# Patient Record
Sex: Male | Born: 1960
Health system: Southern US, Community
[De-identification: ages and names within clinical notes are randomized; demographics above are authoritative.]

## PROBLEM LIST (undated history)

## (undated) DIAGNOSIS — M1990 Unspecified osteoarthritis, unspecified site: Secondary | ICD-10-CM

## (undated) DIAGNOSIS — Q909 Down syndrome, unspecified: Secondary | ICD-10-CM

## (undated) DIAGNOSIS — M51369 Other intervertebral disc degeneration, lumbar region without mention of lumbar back pain or lower extremity pain: Secondary | ICD-10-CM

## (undated) DIAGNOSIS — M5136 Other intervertebral disc degeneration, lumbar region: Secondary | ICD-10-CM

## (undated) HISTORY — DX: Unspecified osteoarthritis, unspecified site: M19.90

## (undated) HISTORY — PX: KNEE SURGERY: SHX244

## (undated) HISTORY — PX: LEG SURGERY: SHX1003

---

## 2006-04-05 ENCOUNTER — Emergency Department: Payer: Self-pay | Admitting: Emergency Medicine

## 2006-04-05 ENCOUNTER — Other Ambulatory Visit: Payer: Self-pay

## 2006-09-25 ENCOUNTER — Emergency Department: Payer: Self-pay | Admitting: General Practice

## 2007-11-19 ENCOUNTER — Emergency Department: Payer: Self-pay | Admitting: Emergency Medicine

## 2007-11-19 ENCOUNTER — Other Ambulatory Visit: Payer: Self-pay

## 2008-06-11 ENCOUNTER — Other Ambulatory Visit: Payer: Self-pay

## 2008-06-11 ENCOUNTER — Emergency Department: Payer: Self-pay | Admitting: Emergency Medicine

## 2012-02-04 ENCOUNTER — Emergency Department: Payer: Self-pay | Admitting: Emergency Medicine

## 2012-02-04 LAB — URINALYSIS, COMPLETE
Bacteria: NONE SEEN
Blood: NEGATIVE
Glucose,UR: NEGATIVE mg/dL (ref 0–75)
Leukocyte Esterase: NEGATIVE
Nitrite: NEGATIVE
Protein: NEGATIVE
RBC,UR: 1 /HPF (ref 0–5)
Specific Gravity: 1.026 (ref 1.003–1.030)
WBC UR: 4 /HPF (ref 0–5)

## 2012-02-04 LAB — COMPREHENSIVE METABOLIC PANEL
Alkaline Phosphatase: 60 U/L (ref 50–136)
BUN: 12 mg/dL (ref 7–18)
Calcium, Total: 8.6 mg/dL (ref 8.5–10.1)
Co2: 31 mmol/L (ref 21–32)
Creatinine: 1.27 mg/dL (ref 0.60–1.30)
EGFR (Non-African Amer.): >60
Osmolality: 288 (ref 275–301)
SGOT(AST): 42 U/L — ABNORMAL HIGH (ref 15–37)
SGPT (ALT): 21 U/L

## 2012-02-04 LAB — CBC
HCT: 39.2 % — ABNORMAL LOW (ref 40.0–52.0)
HGB: 12.8 g/dL — ABNORMAL LOW (ref 13.0–18.0)
MCH: 31.8 pg (ref 26.0–34.0)
MCV: 97 fL (ref 80–100)
Platelet: 268 10*3/uL (ref 150–440)
RBC: 4.04 10*6/uL — ABNORMAL LOW (ref 4.40–5.90)
RDW: 14.2 % (ref 11.5–14.5)
WBC: 4.8 10*3/uL (ref 3.8–10.6)

## 2014-05-25 ENCOUNTER — Emergency Department: Payer: Self-pay | Admitting: Internal Medicine

## 2014-05-25 LAB — URINALYSIS, COMPLETE
BILIRUBIN, UR: NEGATIVE
Bacteria: NONE SEEN
GLUCOSE, UR: NEGATIVE mg/dL (ref 0–75)
KETONE: NEGATIVE
Leukocyte Esterase: NEGATIVE
Nitrite: NEGATIVE
PROTEIN: NEGATIVE
Ph: 5 (ref 4.5–8.0)
SPECIFIC GRAVITY: 1.023 (ref 1.003–1.030)
Squamous Epithelial: 1
WBC UR: 1 /HPF (ref 0–5)

## 2014-06-07 ENCOUNTER — Emergency Department: Payer: Self-pay | Admitting: Emergency Medicine

## 2014-08-26 ENCOUNTER — Emergency Department: Payer: Self-pay | Admitting: Student

## 2014-08-26 LAB — BASIC METABOLIC PANEL
ANION GAP: 9 (ref 7–16)
BUN: 14 mg/dL (ref 7–18)
CALCIUM: 8 mg/dL — AB (ref 8.5–10.1)
CHLORIDE: 104 mmol/L (ref 98–107)
Co2: 29 mmol/L (ref 21–32)
Creatinine: 1.22 mg/dL (ref 0.60–1.30)
GLUCOSE: 89 mg/dL (ref 65–99)
OSMOLALITY: 283 (ref 275–301)
POTASSIUM: 3.4 mmol/L — AB (ref 3.5–5.1)
SODIUM: 142 mmol/L (ref 136–145)

## 2014-08-26 LAB — URINALYSIS, COMPLETE
BLOOD: NEGATIVE
Bacteria: NONE SEEN
Bilirubin,UR: NEGATIVE
Glucose,UR: NEGATIVE mg/dL (ref 0–75)
Ketone: NEGATIVE
LEUKOCYTE ESTERASE: NEGATIVE
Nitrite: NEGATIVE
PROTEIN: NEGATIVE
Ph: 6 (ref 4.5–8.0)
Specific Gravity: 1.017 (ref 1.003–1.030)
WBC UR: 1 /HPF (ref 0–5)

## 2014-08-26 LAB — CBC
HCT: 35 % — ABNORMAL LOW (ref 40.0–52.0)
HGB: 11.1 g/dL — AB (ref 13.0–18.0)
MCH: 30.3 pg (ref 26.0–34.0)
MCHC: 31.8 g/dL — AB (ref 32.0–36.0)
MCV: 96 fL (ref 80–100)
PLATELETS: 198 10*3/uL (ref 150–440)
RBC: 3.67 10*6/uL — AB (ref 4.40–5.90)
RDW: 14.1 % (ref 11.5–14.5)
WBC: 5.1 10*3/uL (ref 3.8–10.6)

## 2014-08-26 LAB — TROPONIN I: Troponin-I: <0.02 ng/mL

## 2015-01-04 DIAGNOSIS — R410 Disorientation, unspecified: Secondary | ICD-10-CM | POA: Diagnosis not present

## 2015-01-04 DIAGNOSIS — M545 Low back pain: Secondary | ICD-10-CM | POA: Diagnosis not present

## 2015-01-19 DIAGNOSIS — Z0000 Encounter for general adult medical examination without abnormal findings: Secondary | ICD-10-CM | POA: Diagnosis not present

## 2015-01-19 DIAGNOSIS — E663 Overweight: Secondary | ICD-10-CM | POA: Diagnosis not present

## 2015-10-31 ENCOUNTER — Emergency Department
Admission: EM | Admit: 2015-10-31 | Discharge: 2015-11-01 | Disposition: A | Discharge status: Home / Self Care | Payer: Commercial Managed Care - HMO | Attending: Emergency Medicine | Admitting: Emergency Medicine

## 2015-10-31 DIAGNOSIS — K5900 Constipation, unspecified: Secondary | ICD-10-CM | POA: Diagnosis not present

## 2015-10-31 DIAGNOSIS — M545 Low back pain: Secondary | ICD-10-CM | POA: Diagnosis present

## 2015-10-31 DIAGNOSIS — M549 Dorsalgia, unspecified: Secondary | ICD-10-CM | POA: Diagnosis not present

## 2015-10-31 LAB — URINALYSIS COMPLETE WITH MICROSCOPIC (ARMC ONLY)
BILIRUBIN URINE: NEGATIVE
Bacteria, UA: NONE SEEN
Glucose, UA: NEGATIVE mg/dL
HGB URINE DIPSTICK: NEGATIVE
KETONES UR: NEGATIVE mg/dL
LEUKOCYTES UA: NEGATIVE
Nitrite: NEGATIVE
PH: 5 (ref 5.0–8.0)
Protein, ur: NEGATIVE mg/dL
Specific Gravity, Urine: 1.023 (ref 1.005–1.030)
Squamous Epithelial / LPF: NONE SEEN

## 2015-10-31 LAB — CBC
HCT: 37.5 % — ABNORMAL LOW (ref 40.0–52.0)
Hemoglobin: 12 g/dL — ABNORMAL LOW (ref 13.0–18.0)
MCH: 29.8 pg (ref 26.0–34.0)
MCHC: 32.1 g/dL (ref 32.0–36.0)
MCV: 92.7 fL (ref 80.0–100.0)
PLATELETS: 248 10*3/uL (ref 150–440)
RBC: 4.04 MIL/uL — ABNORMAL LOW (ref 4.40–5.90)
RDW: 15.3 % — AB (ref 11.5–14.5)
WBC: 6.8 10*3/uL (ref 3.8–10.6)

## 2015-10-31 MED ORDER — SODIUM CHLORIDE 0.9 % IV BOLUS (SEPSIS)
1000.0000 mL | Freq: Once | INTRAVENOUS | Status: AC
  Administered 2015-11-01: 1000 mL via INTRAVENOUS

## 2015-10-31 NOTE — ED Notes (Signed)
Pt family member standing outside of door. Explained to her for patient privacy please remain inside of the room. Tried to explain that this RN had already assessed the patient while she was not in the room, family member ignored this RN and was loudly talking on the phone. This RN left the room closing the door and pt family member opened the door stared at this RN and walked back in the room

## 2015-10-31 NOTE — ED Provider Notes (Signed)
[facility] Emergency Department Provider Note  ____________________________________________  Time seen: 1:00 PM  I have reviewed the triage vital signs and the nursing notes.  History obtained from the patient and his sister HISTORY  Chief Complaint Back Pain     HPI Joseph Flores is a 54 y.o. male Presents with complaint of low back pain times one day. Patient's sister states that he had an episode of unsteady gait today. Patient admits to dysuria and subjective fevers. Patient denies any weakness or numbness at this time.patient states pain is worse with movement current pain score 7 out of 10.     Past medical history Down syndrome There are no active problems to display for this patient.   Past surgical history None No current outpatient prescriptions on file.  Allergies none No family history on file.  Social History Social History  Substance Use Topics  . Smoking status: Not on file  . Smokeless tobacco: Not on file  . Alcohol Use: Not on file    Review of Systems  Constitutional: Negative for fever. Eyes: Negative for visual changes. ENT: Negative for sore throat. Cardiovascular: Negative for chest pain. Respiratory: Negative for shortness of breath. Gastrointestinal: Negative for abdominal pain, vomiting and diarrhea. Genitourinary: Negative for dysuria. Musculoskeletal: positive for back pain Skin: Negative for rash. Neurological: Negative for headaches, focal weakness or numbness.   10-point ROS otherwise negative.  ____________________________________________   PHYSICAL EXAM:  VITAL SIGNS: ED Triage Vitals  Enc Vitals Group     BP 10/31/15 2222 114/77 mmHg     Pulse Rate 10/31/15 2222 64     Resp 10/31/15 2222 18     Temp 10/31/15 2222 98.5 F (36.9 C)     Temp Source 10/31/15 2222 Oral     SpO2 10/31/15 2222 99 %     Weight --      Height --      Head Cir --      Peak Flow --      Pain Score  10/31/15 2222 10     Pain Loc --      Pain Edu? --      Excl. in [place]? --     Constitutional: Alert and oriented. Well appearing and in no distress. Eyes: Conjunctivae are normal. PERRL. Normal extraocular movements. ENT   Head: Normocephalic and atraumatic.   Nose: No congestion/rhinnorhea.   Mouth/Throat: Mucous membranes are moist.   Neck: No stridor. Hematological/Lymphatic/Immunilogical: No cervical lymphadenopathy. Cardiovascular: Normal rate, regular rhythm. Normal and symmetric distal pulses are present in all extremities. No murmurs, rubs, or gallops. Respiratory: Normal respiratory effort without tachypnea nor retractions. Breath sounds are clear and equal bilaterally. No wheezes/rales/rhonchi. Gastrointestinal: Bilateral lower quadrant tenderness to palpation.. No distention. Left CVA tenderness Genitourinary: deferred Musculoskeletal: Nontender with normal range of motion in all extremities. No joint effusions.  No lower extremity tenderness nor edema. Neurologic:  Normal speech and language. No gross focal neurologic deficits are appreciated. Speech is normal.  Skin:  Skin is warm, dry and intact. No rash noted. Psychiatric: Mood and affect are normal. Speech and behavior are normal. Patient exhibits appropriate insight and judgment.  ____________________________________________    LABS (pertinent positives/negatives) Labs Reviewed  CBC - Abnormal; Notable for the following:    RBC 4.04 (*)    Hemoglobin 12.0 (*)    HCT 37.5 (*)    RDW 15.3 (*)    All other components within normal limits  COMPREHENSIVE METABOLIC PANEL - Abnormal;  Notable for the following:    Glucose, Bld 116 (*)    Albumin 3.3 (*)    All other components within normal limits  URINALYSIS COMPLETEWITH MICROSCOPIC ([facility] ONLY) - Abnormal; Notable for the following:    Color, Urine YELLOW (*)    APPearance CLEAR (*)    All other components within normal limits       RADIOLOGY       CT Abdomen Pelvis W Contrast (Final result) Result time: 11/01/15 01:21:03   Final result by Rad Results In Interface (11/01/15 01:21:03)   Narrative:   CLINICAL DATA: 53 year old male with generalized abdominal pain as well as back pain.  EXAM: CT ABDOMEN AND PELVIS WITH CONTRAST  TECHNIQUE: Multidetector CT imaging of the abdomen and pelvis was performed using the standard protocol following bolus administration of intravenous contrast.  CONTRAST: 168mL OMNIPAQUE IOHEXOL 350 MG/ML SOLN  COMPARISON: Lumbar spine radiograph dated 02/04/2012  FINDINGS: The visualized lung bases are clear. No intra-abdominal free air or free fluid.  The liver, gallbladder, pancreas, spleen, adrenal glands, kidneys, visualized ureters appear unremarkable. There is apparent diffuse thickening of the bladder wall which may be partly related to underdistention. Cystitis is not excluded. Correlation with urinalysis recommended. The prostate and seminal vesicles are grossly unremarkable.  There is moderate stool throughout the colon. No evidence of bowel obstruction or inflammation. Normal appendix.  The abdominal aorta and IVC appear unremarkable. No portal venous gas identified. There is no lymphadenopathy. There is mild diffuse stranding of the subhepatic fat, nonspecific. No fluid collection identified. There is a focal area of swirling within the mesentery in the left hemiabdomen without evidence of obstruction or inflammation.  There is diffuse thickening and stranding of the subcutaneous soft tissues of the perineum concerning for an infectious process. No soft tissue gas or fluid collection identified. Clinical correlation is recommended. There is diffuse degenerative changes of the spine. No acute fracture.  IMPRESSION: Constipation. No evidence of bowel obstruction or inflammation. Normal appendix.  Diffuse thickening and inflammatory changes of the subcutaneous  soft tissues of the perineum without soft tissue gas or fluid collection. Clinical correlation is recommended.  Diffuse bladder wall thickening. Correlation with urinalysis recommended to exclude cystitis.   Electronically Signed By: Anner Crete M.D. On: 11/01/2015 01:21        ECG Results       INITIAL IMPRESSION / ASSESSMENT AND PLAN / ED COURSE  Pertinent labs & imaging results that were available during my care of the patient were reviewed by me and considered in my medical decision making (see chart for details).  Regarding CT scan findings of diffuse peritoneal signs subcutaneous soft tissue inflammation I evaluated the patient's perineum and noted no evidence of cellulitis or abscess formation. Urinalysis revealed no evidence of a UTI.  ____________________________________________   FINAL CLINICAL IMPRESSION(S) / ED DIAGNOSES  Final diagnoses:  Constipation, unspecified constipation type      Gregor Hams, MD 11/01/15 [phone]

## 2015-10-31 NOTE — ED Notes (Signed)
Pt is developmentally delayed. Pt reports back pain after "taking a pill of my sisters".

## 2015-11-01 ENCOUNTER — Emergency Department: Payer: Commercial Managed Care - HMO

## 2015-11-01 DIAGNOSIS — K5900 Constipation, unspecified: Secondary | ICD-10-CM | POA: Diagnosis not present

## 2015-11-01 LAB — COMPREHENSIVE METABOLIC PANEL
ALT: 19 U/L (ref 17–63)
ANION GAP: 9 (ref 5–15)
AST: 35 U/L (ref 15–41)
Albumin: 3.3 g/dL — ABNORMAL LOW (ref 3.5–5.0)
Alkaline Phosphatase: 62 U/L (ref 38–126)
BILIRUBIN TOTAL: 0.8 mg/dL (ref 0.3–1.2)
BUN: 19 mg/dL (ref 6–20)
CHLORIDE: 102 mmol/L (ref 101–111)
CO2: 30 mmol/L (ref 22–32)
Calcium: 8.9 mg/dL (ref 8.9–10.3)
Creatinine, Ser: 1.18 mg/dL (ref 0.61–1.24)
GFR calc Af Amer: >60 mL/min (ref >60)
Glucose, Bld: 116 mg/dL — ABNORMAL HIGH (ref 65–99)
POTASSIUM: 3.9 mmol/L (ref 3.5–5.1)
Sodium: 141 mmol/L (ref 135–145)
TOTAL PROTEIN: 7.5 g/dL (ref 6.5–8.1)

## 2015-11-01 MED ORDER — IOHEXOL 350 MG/ML SOLN
100.0000 mL | Freq: Once | INTRAVENOUS | Status: AC | PRN
  Administered 2015-11-01: 100 mL via INTRAVENOUS

## 2015-11-01 MED ORDER — IOHEXOL 240 MG/ML SOLN
25.0000 mL | Freq: Once | INTRAMUSCULAR | Status: AC | PRN
  Administered 2015-11-01: 25 mL via ORAL

## 2015-11-01 MED ORDER — MAGNESIUM CITRATE PO SOLN
1.0000 | Freq: Once | ORAL | Status: AC
  Administered 2015-11-01: 1 via ORAL
  Filled 2015-11-01: qty 296

## 2015-11-01 MED ORDER — SENNOSIDES-DOCUSATE SODIUM 8.6-50 MG PO TABS
2.0000 | ORAL_TABLET | Freq: Once | ORAL | Status: AC
  Administered 2015-11-01: 2 via ORAL
  Filled 2015-11-01: qty 2

## 2015-11-01 NOTE — Discharge Instructions (Signed)

## 2015-11-08 ENCOUNTER — Ambulatory Visit
Admission: EM | Admit: 2015-11-08 | Discharge: 2015-11-08 | Disposition: A | Discharge status: Home / Self Care | Payer: Commercial Managed Care - HMO | Attending: Family Medicine | Admitting: Family Medicine

## 2015-11-08 ENCOUNTER — Ambulatory Visit (INDEPENDENT_AMBULATORY_CARE_PROVIDER_SITE_OTHER): Payer: Commercial Managed Care - HMO

## 2015-11-08 DIAGNOSIS — M47816 Spondylosis without myelopathy or radiculopathy, lumbar region: Secondary | ICD-10-CM | POA: Diagnosis not present

## 2015-11-08 DIAGNOSIS — M47896 Other spondylosis, lumbar region: Secondary | ICD-10-CM

## 2015-11-08 DIAGNOSIS — M545 Low back pain: Secondary | ICD-10-CM

## 2015-11-08 HISTORY — DX: Down syndrome, unspecified: Q90.9

## 2015-11-08 LAB — CBC WITH DIFFERENTIAL/PLATELET
BASOS ABS: 0 10*3/uL (ref 0–0.1)
Basophils Relative: 1 %
EOS ABS: 0 10*3/uL (ref 0–0.7)
EOS PCT: 0 %
HCT: 35.9 % — ABNORMAL LOW (ref 40.0–52.0)
HEMOGLOBIN: 11.8 g/dL — AB (ref 13.0–18.0)
LYMPHS ABS: 0.9 10*3/uL — AB (ref 1.0–3.6)
Lymphocytes Relative: 17 %
MCH: 30.6 pg (ref 26.0–34.0)
MCHC: 32.8 g/dL (ref 32.0–36.0)
MCV: 93.1 fL (ref 80.0–100.0)
Monocytes Absolute: 0.7 10*3/uL (ref 0.2–1.0)
Monocytes Relative: 14 %
NEUTROS PCT: 68 %
Neutro Abs: 3.6 10*3/uL (ref 1.4–6.5)
PLATELETS: 387 10*3/uL (ref 150–440)
RBC: 3.85 MIL/uL — AB (ref 4.40–5.90)
RDW: 14.8 % — ABNORMAL HIGH (ref 11.5–14.5)
WBC: 5.3 10*3/uL (ref 3.8–10.6)

## 2015-11-08 LAB — BASIC METABOLIC PANEL
ANION GAP: 9 (ref 5–15)
BUN: 25 mg/dL — AB (ref 6–20)
CHLORIDE: 100 mmol/L — AB (ref 101–111)
CO2: 31 mmol/L (ref 22–32)
Calcium: 9.1 mg/dL (ref 8.9–10.3)
Creatinine, Ser: 1.41 mg/dL — ABNORMAL HIGH (ref 0.61–1.24)
GFR, EST NON AFRICAN AMERICAN: 55 mL/min — AB (ref >60)
Glucose, Bld: 109 mg/dL — ABNORMAL HIGH (ref 65–99)
POTASSIUM: 3.8 mmol/L (ref 3.5–5.1)
SODIUM: 140 mmol/L (ref 135–145)

## 2015-11-08 MED ORDER — KETOROLAC TROMETHAMINE 60 MG/2ML IM SOLN
60.0000 mg | Freq: Once | INTRAMUSCULAR | Status: AC
  Administered 2015-11-08: 60 mg via INTRAMUSCULAR

## 2015-11-08 MED ORDER — CYCLOBENZAPRINE HCL 10 MG PO TABS: 10.0000 mg | ORAL_TABLET | Freq: Every day | ORAL | Status: DC

## 2015-11-08 NOTE — ED Notes (Signed)
Back pain reported "for a while".  Been seen at clinic in March.  Seen at [facility] last tues.   Caregiver reports blood and urine tests were fine.  Wants to see what is going on with his back.  Pt was walking slowly to treatment room.

## 2015-11-08 NOTE — ED Provider Notes (Signed)
CSN: [id number]     Arrival date & time 11/08/15  1424 History   First MD Initiated Contact with Patient 11/08/15 1541     Chief Complaint  Patient presents with  . Back Pain   (Consider location/radiation/quality/duration/timing/severity/associated sxs/prior Treatment) HPI Comments: 54 yo male with a h/o Down syndrome presents with a 1 week h/o low back pain, worse with position changes. Was seen in ED about 6 days ago and diagnosed with constipation; question about findings in the perineum on CT, however exam in ED negative, normal.  Patient here with sister who is caretaker. Patient has had normal bowel movements the last few days. No urinary symptoms. Complains of pain and points to the lower back (lumbar) area.   The history is provided by the patient and a relative.    Past Medical History  Diagnosis Date  . Hypertension   . Down's syndrome    History reviewed. No pertinent past surgical history. History reviewed. No pertinent family history. Social History  Substance Use Topics  . Smoking status: Never Smoker   . Smokeless tobacco: None  . Alcohol Use: No    Review of Systems  Allergies  Review of patient's allergies indicates no known allergies.  Home Medications   Prior to Admission medications   Medication Sig Start Date End Date Taking? Authorizing Provider  cyclobenzaprine (FLEXERIL) 10 MG tablet Take 1 tablet (10 mg total) by mouth at bedtime. 11/08/15   Norval Gable, MD   Meds Ordered and Administered this Visit   Medications  ketorolac (TORADOL) injection 60 mg (60 mg Intramuscular Given 11/08/15 1702)    BP 95/64 mmHg  Pulse 84  Temp(Src) 96.6 F (35.9 C) (Tympanic)  Resp 16  SpO2 100% No data found.   Physical Exam  Constitutional: He appears well-developed and well-nourished. No distress.  Pulmonary/Chest: Effort normal. No respiratory distress.  Genitourinary:  Perineum normal appearing and no tenderness to palpation  Musculoskeletal:      Lumbar back: He exhibits tenderness, bony tenderness and spasm. He exhibits normal range of motion, no swelling, no edema, no deformity, no laceration, no pain and normal pulse.  Mild tenderness to palpation over the lumbar sacral paraspinous muscles and vertebrea  Neurological: He is alert. He has normal reflexes. He displays normal reflexes. He exhibits normal muscle tone. Coordination normal.  Skin: No rash noted. He is not diaphoretic.  Nursing note and vitals reviewed.   ED Course  Procedures (including critical care time)  Labs Review Labs Reviewed  CBC WITH DIFFERENTIAL/PLATELET - Abnormal; Notable for the following:    RBC 3.85 (*)    Hemoglobin 11.8 (*)    HCT 35.9 (*)    RDW 14.8 (*)    Lymphs Abs 0.9 (*)    All other components within normal limits  BASIC METABOLIC PANEL - Abnormal; Notable for the following:    Chloride 100 (*)    Glucose, Bld 109 (*)    BUN 25 (*)    Creatinine, Ser 1.41 (*)    GFR calc non Af Amer 55 (*)    All other components within normal limits    Imaging Review Dg Lumbar Spine Complete  11/08/2015  CLINICAL DATA:  Low back pain for several months, no known injury, initial encounter EXAM: LUMBAR SPINE - COMPLETE 4+ VIEW COMPARISON:  None. FINDINGS: Five lumbar type vertebral bodies are well visualized. Vertebral body height is well maintained. Pelvic ring is intact. No spondylolysis or spondylolisthesis is seen. Mild facet hypertrophic changes are  noted. Mild osteophytic changes are seen. Disc space narrowing is noted at L4-5. IMPRESSION: Multilevel degenerative change without acute abnormality. Electronically Signed   By: Inez Catalina M.D.   On: 11/08/2015 16:32   Dg Sacrum/coccyx  11/08/2015  CLINICAL DATA:  Mid lower back pain off and on since March. EXAM: SACRUM AND COCCYX - 2+ VIEW COMPARISON:  CT 11/01/2015 FINDINGS: There is no evidence of fracture or other focal bone lesions. Mild degenerate spurring and degenerative changes in the  lower lumbar spine. IMPRESSION: No acute findings. Electronically Signed   By: Rolm Baptise M.D.   On: 11/08/2015 16:33     Visual Acuity Review  Right Eye Distance:   Left Eye Distance:   Bilateral Distance:    Right Eye Near:   Left Eye Near:    Bilateral Near:         MDM   1. Low back pain without sciatica, unspecified back pain laterality   2. Other osteoarthritis of spine, lumbar region      Discharge Medication List as of 11/08/2015  5:57 PM    START taking these medications   Details  cyclobenzaprine (FLEXERIL) 10 MG tablet Take 1 tablet (10 mg total) by mouth at bedtime., Starting 11/08/2015, Until Discontinued, Normal       1. Lab results, x-ray results and diagnosis reviewed with sister and patient  2. rx as per orders above; reviewed possible side effects, interactions, risks and benefits  3. Recommend supportive treatment with otc NSAIDS/analgesics, gentle low back stretches; increased fluids 4. Follow-up prn if symptoms worsen or don't improve   Norval Gable, MD 11/08/15 [phone]

## 2015-11-08 NOTE — ED Notes (Addendum)
Per sister pt usually up and active.  Rides bicycle to [place], but hasn't recently.   When asked, pt points to lower back as location of pain.

## 2015-11-08 NOTE — ED Notes (Addendum)
Pt unable to verbally give pain score but does report his pain to be doing better. MD aware.

## 2015-12-06 ENCOUNTER — Encounter: Payer: Self-pay | Admitting: Family Medicine

## 2015-12-06 ENCOUNTER — Ambulatory Visit (INDEPENDENT_AMBULATORY_CARE_PROVIDER_SITE_OTHER): Payer: Commercial Managed Care - HMO | Admitting: Family Medicine

## 2015-12-06 VITALS — BP 118/68 | HR 89 | Temp 98.5°F | Resp 18 | Ht 61.0 in | Wt 141.1 lb

## 2015-12-06 DIAGNOSIS — R7989 Other specified abnormal findings of blood chemistry: Secondary | ICD-10-CM | POA: Insufficient documentation

## 2015-12-06 DIAGNOSIS — Z7189 Other specified counseling: Secondary | ICD-10-CM | POA: Diagnosis not present

## 2015-12-06 DIAGNOSIS — R748 Abnormal levels of other serum enzymes: Secondary | ICD-10-CM | POA: Diagnosis not present

## 2015-12-06 DIAGNOSIS — M5136 Other intervertebral disc degeneration, lumbar region: Secondary | ICD-10-CM | POA: Insufficient documentation

## 2015-12-06 DIAGNOSIS — Z7689 Persons encountering health services in other specified circumstances: Secondary | ICD-10-CM

## 2015-12-06 DIAGNOSIS — Q909 Down syndrome, unspecified: Secondary | ICD-10-CM

## 2015-12-06 DIAGNOSIS — R011 Cardiac murmur, unspecified: Secondary | ICD-10-CM | POA: Insufficient documentation

## 2015-12-06 MED ORDER — MELOXICAM 7.5 MG PO TABS: 7.5000 mg | ORAL_TABLET | Freq: Every day | ORAL | Status: DC
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
## 2015-12-07 LAB — COMPREHENSIVE METABOLIC PANEL WITH GFR
ALT: 13 IU/L (ref 0–44)
AST: 23 IU/L (ref 0–40)
Albumin/Globulin Ratio: 1.3 (ref 1.1–2.5)
Albumin: 3.4 g/dL — ABNORMAL LOW (ref 3.5–5.5)
Alkaline Phosphatase: 60 IU/L (ref 39–117)
BUN/Creatinine Ratio: 9 (ref 9–20)
BUN: 12 mg/dL (ref 6–24)
Bilirubin Total: <0.2 mg/dL (ref 0.0–1.2)
CO2: 25 mmol/L (ref 18–29)
Calcium: 7.9 mg/dL — ABNORMAL LOW (ref 8.7–10.2)
Chloride: 98 mmol/L (ref 96–106)
Creatinine, Ser: 1.32 mg/dL — ABNORMAL HIGH (ref 0.76–1.27)
GFR calc Af Amer: 71 mL/min/1.73
GFR calc non Af Amer: 61 mL/min/1.73
Globulin, Total: 2.6 g/dL (ref 1.5–4.5)
Glucose: 94 mg/dL (ref 65–99)
Potassium: 3.3 mmol/L — ABNORMAL LOW (ref 3.5–5.2)
Sodium: 138 mmol/L (ref 134–144)
Total Protein: 6 g/dL (ref 6.0–8.5)

## 2016-01-01 ENCOUNTER — Ambulatory Visit (INDEPENDENT_AMBULATORY_CARE_PROVIDER_SITE_OTHER): Payer: Commercial Managed Care - HMO | Admitting: Cardiovascular Disease

## 2016-01-01 ENCOUNTER — Other Ambulatory Visit: Payer: Self-pay | Admitting: Family Medicine

## 2016-01-01 ENCOUNTER — Encounter: Payer: Self-pay | Admitting: Cardiovascular Disease

## 2016-01-01 VITALS — BP 84/60 | HR 79 | Ht 60.0 in | Wt 139.5 lb

## 2016-01-01 DIAGNOSIS — R011 Cardiac murmur, unspecified: Secondary | ICD-10-CM | POA: Diagnosis not present

## 2016-01-01 NOTE — Assessment & Plan Note (Addendum)
Based on the quality of the heart murmur, it is most likely physiologic. However, given his known history of Down's syndrome and association with congenital heart disease especially atrial septal defect, I requested an echocardiogram for evaluation.

## 2016-01-01 NOTE — Patient Instructions (Signed)
Medication Instructions:  Your physician recommends that you continue on your current medications as directed. Please refer to the Current Medication list given to you today.   Labwork: none  Testing/Procedures: Your physician has requested that you have an echocardiogram. Echocardiography is a painless test that uses sound waves to create images of your heart. It provides your doctor with information about the size and shape of your heart and how well your heart's chambers and valves are working. This procedure takes approximately one hour. There are no restrictions for this procedure.    Follow-Up: Your physician recommends that you schedule a follow-up appointment as needed with Dr. Fletcher Anon.    Any Other Special Instructions Will Be Listed Below (If Applicable).     If you need a refill on your cardiac medications before your next appointment, please call your pharmacy.  Echocardiogram An echocardiogram, or echocardiography, uses sound waves (ultrasound) to produce an image of your heart. The echocardiogram is simple, painless, obtained within a short period of time, and offers valuable information to your health care provider. The images from an echocardiogram can provide information such as:  Evidence of coronary artery disease (CAD).  Heart size.  Heart muscle function.  Heart valve function.  Aneurysm detection.  Evidence of a past heart attack.  Fluid buildup around the heart.  Heart muscle thickening.  Assess heart valve function. LET [facility] CARE PROVIDER KNOW ABOUT:  Any allergies you have.  All medicines you are taking, including vitamins, herbs, eye drops, creams, and over-the-counter medicines.  Previous problems you or members of your family have had with the use of anesthetics.  Any blood disorders you have.  Previous surgeries you have had.  Medical conditions you have.  Possibility of pregnancy, if this applies. BEFORE THE PROCEDURE  No  special preparation is needed. Eat and drink normally.  PROCEDURE   In order to produce an image of your heart, gel will be applied to your chest and a wand-like tool (transducer) will be moved over your chest. The gel will help transmit the sound waves from the transducer. The sound waves will harmlessly bounce off your heart to allow the heart images to be captured in real-time motion. These images will then be recorded.  You may need an IV to receive a medicine that improves the quality of the pictures. AFTER THE PROCEDURE You may return to your normal schedule including diet, activities, and medicines, unless your health care provider tells you otherwise.   This information is not intended to replace advice given to you by your health care provider. Make sure you discuss any questions you have with your health care provider.   Document Released: 10/25/2000 Document Revised: 11/18/2014 Document Reviewed: 07/05/2013 Elsevier Interactive Patient Education [employer].
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
## 2016-01-08 ENCOUNTER — Ambulatory Visit (INDEPENDENT_AMBULATORY_CARE_PROVIDER_SITE_OTHER): Payer: Commercial Managed Care - HMO | Admitting: Family Medicine

## 2016-01-08 ENCOUNTER — Encounter: Payer: Self-pay | Admitting: Family Medicine

## 2016-01-08 VITALS — BP 99/67 | HR 86 | Temp 98.9°F | Resp 16 | Ht 60.0 in | Wt 140.2 lb

## 2016-01-08 DIAGNOSIS — M5136 Other intervertebral disc degeneration, lumbar region: Secondary | ICD-10-CM | POA: Diagnosis not present

## 2016-01-08 DIAGNOSIS — R2689 Other abnormalities of gait and mobility: Secondary | ICD-10-CM | POA: Insufficient documentation
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
## 2016-01-12 ENCOUNTER — Other Ambulatory Visit: Payer: Self-pay

## 2016-01-12 ENCOUNTER — Ambulatory Visit (INDEPENDENT_AMBULATORY_CARE_PROVIDER_SITE_OTHER): Payer: Commercial Managed Care - HMO

## 2016-01-12 DIAGNOSIS — R011 Cardiac murmur, unspecified: Secondary | ICD-10-CM | POA: Diagnosis not present

## 2016-01-17 ENCOUNTER — Encounter: Payer: Self-pay | Admitting: Physical Therapy

## 2016-01-17 ENCOUNTER — Ambulatory Visit: Payer: Commercial Managed Care - HMO | Attending: Family Medicine | Admitting: Physical Therapy

## 2016-01-17 DIAGNOSIS — M6281 Muscle weakness (generalized): Secondary | ICD-10-CM | POA: Diagnosis not present

## 2016-01-17 DIAGNOSIS — R269 Unspecified abnormalities of gait and mobility: Secondary | ICD-10-CM | POA: Insufficient documentation

## 2016-01-17 DIAGNOSIS — M2560 Stiffness of unspecified joint, not elsewhere classified: Secondary | ICD-10-CM | POA: Insufficient documentation

## 2016-01-17 DIAGNOSIS — M545 Low back pain, unspecified: Secondary | ICD-10-CM
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
## 2016-01-22 ENCOUNTER — Encounter: Payer: Self-pay | Admitting: Physical Therapy

## 2016-01-22 ENCOUNTER — Ambulatory Visit: Payer: Commercial Managed Care - HMO | Admitting: Physical Therapy

## 2016-01-22 DIAGNOSIS — M6281 Muscle weakness (generalized): Secondary | ICD-10-CM | POA: Diagnosis not present

## 2016-01-22 DIAGNOSIS — M545 Low back pain, unspecified: Secondary | ICD-10-CM

## 2016-01-22 DIAGNOSIS — R269 Unspecified abnormalities of gait and mobility: Secondary | ICD-10-CM | POA: Diagnosis not present

## 2016-01-22 DIAGNOSIS — M2560 Stiffness of unspecified joint, not elsewhere classified: Secondary | ICD-10-CM
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
## 2016-01-23 ENCOUNTER — Encounter: Payer: Self-pay | Admitting: Physical Therapy

## 2016-01-23 ENCOUNTER — Ambulatory Visit: Payer: Commercial Managed Care - HMO | Admitting: Physical Therapy

## 2016-01-23 DIAGNOSIS — M545 Low back pain, unspecified: Secondary | ICD-10-CM

## 2016-01-23 DIAGNOSIS — M2560 Stiffness of unspecified joint, not elsewhere classified: Secondary | ICD-10-CM

## 2016-01-23 DIAGNOSIS — R269 Unspecified abnormalities of gait and mobility: Secondary | ICD-10-CM | POA: Diagnosis not present

## 2016-01-23 DIAGNOSIS — M6281 Muscle weakness (generalized): Secondary | ICD-10-CM | POA: Diagnosis not present
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
## 2016-01-31 ENCOUNTER — Ambulatory Visit: Payer: Commercial Managed Care - HMO | Admitting: Physical Therapy

## 2016-01-31 ENCOUNTER — Encounter: Payer: Self-pay | Admitting: Physical Therapy

## 2016-01-31 DIAGNOSIS — R269 Unspecified abnormalities of gait and mobility: Secondary | ICD-10-CM

## 2016-01-31 DIAGNOSIS — M545 Low back pain, unspecified: Secondary | ICD-10-CM

## 2016-01-31 DIAGNOSIS — M2560 Stiffness of unspecified joint, not elsewhere classified: Secondary | ICD-10-CM | POA: Diagnosis not present

## 2016-01-31 DIAGNOSIS — M6281 Muscle weakness (generalized): Secondary | ICD-10-CM
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
## 2016-02-01 ENCOUNTER — Telehealth: Payer: Self-pay | Admitting: Cardiovascular Disease

## 2016-02-01 ENCOUNTER — Ambulatory Visit: Payer: Commercial Managed Care - HMO | Admitting: Physical Therapy

## 2016-02-01 DIAGNOSIS — R269 Unspecified abnormalities of gait and mobility: Secondary | ICD-10-CM

## 2016-02-01 DIAGNOSIS — M545 Low back pain, unspecified: Secondary | ICD-10-CM

## 2016-02-01 DIAGNOSIS — M6281 Muscle weakness (generalized): Secondary | ICD-10-CM

## 2016-02-01 DIAGNOSIS — M2560 Stiffness of unspecified joint, not elsewhere classified: Secondary | ICD-10-CM | POA: Diagnosis not present
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
## 2016-02-01 NOTE — Telephone Encounter (Signed)
Reviewed echo results w/Judy Bobette Mo who verbalized understanding.

## 2016-02-01 NOTE — Telephone Encounter (Signed)
Pt wife calling stating they received a letter about results Please call back.

## 2016-02-05 ENCOUNTER — Ambulatory Visit: Payer: Commercial Managed Care - HMO | Admitting: Physical Therapy

## 2016-02-05 DIAGNOSIS — M2560 Stiffness of unspecified joint, not elsewhere classified: Secondary | ICD-10-CM | POA: Diagnosis not present

## 2016-02-05 DIAGNOSIS — M545 Low back pain, unspecified: Secondary | ICD-10-CM

## 2016-02-05 DIAGNOSIS — R269 Unspecified abnormalities of gait and mobility: Secondary | ICD-10-CM | POA: Diagnosis not present

## 2016-02-05 DIAGNOSIS — M6281 Muscle weakness (generalized): Secondary | ICD-10-CM | POA: Diagnosis not present
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
## 2016-02-06 ENCOUNTER — Ambulatory Visit: Payer: Commercial Managed Care - HMO | Admitting: Physical Therapy

## 2016-02-06 DIAGNOSIS — M6281 Muscle weakness (generalized): Secondary | ICD-10-CM

## 2016-02-06 DIAGNOSIS — M545 Low back pain, unspecified: Secondary | ICD-10-CM

## 2016-02-06 DIAGNOSIS — M2560 Stiffness of unspecified joint, not elsewhere classified: Secondary | ICD-10-CM

## 2016-02-06 DIAGNOSIS — R269 Unspecified abnormalities of gait and mobility: Secondary | ICD-10-CM

## 2016-02-06 DIAGNOSIS — M1731 Unilateral post-traumatic osteoarthritis, right knee: Secondary | ICD-10-CM | POA: Diagnosis not present
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
## 2016-02-14 ENCOUNTER — Ambulatory Visit: Payer: Commercial Managed Care - HMO | Attending: Family Medicine | Admitting: Physical Therapy

## 2016-02-14 DIAGNOSIS — M545 Low back pain, unspecified: Secondary | ICD-10-CM

## 2016-02-14 DIAGNOSIS — M6281 Muscle weakness (generalized): Secondary | ICD-10-CM | POA: Diagnosis not present

## 2016-02-14 DIAGNOSIS — R269 Unspecified abnormalities of gait and mobility: Secondary | ICD-10-CM | POA: Insufficient documentation

## 2016-02-14 DIAGNOSIS — M2560 Stiffness of unspecified joint, not elsewhere classified: Secondary | ICD-10-CM | POA: Diagnosis not present

## 2016-02-15 ENCOUNTER — Encounter: Payer: Self-pay | Admitting: Physical Therapy

## 2016-02-15 ENCOUNTER — Ambulatory Visit: Payer: Commercial Managed Care - HMO | Admitting: Physical Therapy

## 2016-02-15 DIAGNOSIS — R269 Unspecified abnormalities of gait and mobility: Secondary | ICD-10-CM | POA: Diagnosis not present

## 2016-02-15 DIAGNOSIS — M6281 Muscle weakness (generalized): Secondary | ICD-10-CM | POA: Diagnosis not present

## 2016-02-15 DIAGNOSIS — M545 Low back pain, unspecified: Secondary | ICD-10-CM

## 2016-02-15 DIAGNOSIS — M2560 Stiffness of unspecified joint, not elsewhere classified: Secondary | ICD-10-CM
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
## 2016-02-19 ENCOUNTER — Ambulatory Visit: Payer: Commercial Managed Care - HMO

## 2016-02-19 DIAGNOSIS — M545 Low back pain, unspecified: Secondary | ICD-10-CM

## 2016-02-19 DIAGNOSIS — M2560 Stiffness of unspecified joint, not elsewhere classified: Secondary | ICD-10-CM | POA: Diagnosis not present

## 2016-02-19 DIAGNOSIS — M6281 Muscle weakness (generalized): Secondary | ICD-10-CM | POA: Diagnosis not present

## 2016-02-19 DIAGNOSIS — R269 Unspecified abnormalities of gait and mobility: Secondary | ICD-10-CM
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
## 2016-02-20 ENCOUNTER — Encounter: Payer: Commercial Managed Care - HMO | Admitting: Physical Therapy

## 2016-03-31 ENCOUNTER — Emergency Department
Admission: EM | Admit: 2016-03-31 | Discharge: 2016-03-31 | Disposition: A | Discharge status: Home / Self Care | Payer: Commercial Managed Care - HMO | Attending: Emergency Medicine | Admitting: Emergency Medicine

## 2016-03-31 DIAGNOSIS — Y999 Unspecified external cause status: Secondary | ICD-10-CM | POA: Insufficient documentation

## 2016-03-31 DIAGNOSIS — Y92410 Unspecified street and highway as the place of occurrence of the external cause: Secondary | ICD-10-CM | POA: Insufficient documentation

## 2016-03-31 DIAGNOSIS — M1990 Unspecified osteoarthritis, unspecified site: Secondary | ICD-10-CM | POA: Insufficient documentation

## 2016-03-31 DIAGNOSIS — Y9355 Activity, bike riding: Secondary | ICD-10-CM | POA: Diagnosis not present

## 2016-03-31 DIAGNOSIS — S0990XA Unspecified injury of head, initial encounter: Secondary | ICD-10-CM | POA: Diagnosis not present

## 2016-03-31 DIAGNOSIS — M545 Low back pain: Secondary | ICD-10-CM | POA: Diagnosis not present
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
## 2016-03-31 NOTE — Discharge Instructions (Signed)
You have been seen in the Emergency Department (ED) today for a fall.  Your work up does not show any concerning injuries.  Please take over-the-counter ibuprofen and/or Tylenol as needed for your pain (unless you have an allergy or your doctor as told you not to take them), or take any prescribed medication as instructed.  Please follow up with your doctor regarding today's Emergency Department (ED) visit and your recent fall.    Return to the ED if you have any headache, confusion, slurred speech, weakness/numbness of any arm or leg, or any increased pain.   Bike Safety, Adult Riding a bike is a fun activity that is good for your health. However, it is important that you know how to stay safe while biking. WHAT DO I NEED TO WEAR WHILE BIKING?  Helmet A helmet is the most important piece of equipment that you can wear to protect yourself while riding a bike. Make sure that you:  Always wear a helmet when you ride a bike, and make sure that the straps are fastened.  Wear a helmet that is specifically made for biking.  Have a helmet that has been safety-approved. Look for a helmet that has a Dance movement psychotherapist) sticker. If you have any questions, ask them at the store where you are buying the helmet. Never buy a used helmet.  Get a new helmet if you get into a bike accident. You should also get a new helmet every five years or sooner.  Have a helmet that is well-ventilated. A helmet will not help to protect you if it does not fit properly. Here are some tips to make sure your helmet fits:  The helmet should sit on top of your head. It should not tip backward or forward.  Find the smallest helmet shell size that fits over your head.  Do not use helmet pads to make a helmet fit if it is too big for your head.  Leave space for about two fingers between your eyebrows and the front brim of the helmet.  The straps should be joined under each of your ears at the  jawbone.  The buckle should be snug when your mouth is completely open. Other equipment Make sure that you wear:  Shoes that are safe for biking, such as sneakers. The shoes should not slip on the pedals. Do not ride a bike barefoot.  Do not wear flip flops.  Do not wear cleats.  Do not wear shoes with heels.  Pants that are fitted, if you are wearing pants. If your pants are too loose or wide at the bottom, they can get stuck in the bike chain.  Bright or fluorescent clothes. This helps you to be visible. Avoid dark-colored clothes.  Reflective tape is also helpful.  Clothes that are comfortable and appropriate for the weather. WHAT RULES DO I NEED TO KNOW TO BIKE SAFELY? You need to know to:  Obey all traffic signs. These include:  Stop signs.  Traffic lights.  Bike in the same direction as the cars. Never bike against traffic.  Use hand signals, including signals to:  Make a left-hand turn.  Make a right-hand turn.  Stop.  Never listen to headphones while riding a bike.  Never text or talk on a cell phone while riding a bike.  Never stand up while riding a bike.  Always stop and check for pedestrians, cars, and any other traffic whenever you start a bike ride. Always look in  both directions.  Never have more than one adult on a bike. If you are carrying a child in a bike seat, make sure that the bike seat or carrier has been safety-approved.  Be careful. Watch for:  Cars opening up doors.  Cars leaving driveways.  Pedestrians.  Road hazards, such as potholes or puddles.  Ride in single file if you are riding in a group.  Walk your bike across busy intersections.  Pass on the left side, if you are passing a pedestrian or another biker. Call out that you are on the left so the pedestrian or biker knows that you are there.  Never attach your bike to another moving object, vehicle, or pet.  Always hold the handlebars with both hands.  Always use  bike lanes or paths when they are available. WHAT SHOULD I CHECK BEFORE RIDING A BIKE? You should always check that:  Your helmet fits properly. This is important because straps can loosen over time.  The bike's front and back brakes work.  The bike's tires are inflated properly.  The seat is at the correct level.  The chain is not loose, rusted, or making cracking or grinding noises when in use. WHEN SHOULD I AVOID RIDING A BIKE? Do not ride a bike:  If the weather conditions are unsafe, such as during a thunderstorm or if the roads are icy.  If it is dark outside. If you must ride at night, make sure that you wear bright clothing and have reflectors or lights in the front and back of the bike.  If you have been drinking alcohol or using drugs.  If your health care provider has advised you not to ride a bike.   This information is not intended to replace advice given to you by your health care provider. Make sure you discuss any questions you have with your health care provider.   Document Released: 01/18/2004 Document Revised: 11/18/2014 Document Reviewed: 09/21/2014 Elsevier Interactive Patient Education 2016 [place] Injury, Adult You have a head injury. Headaches and throwing up (vomiting) are common after a head injury. It should be easy to wake up from sleeping. Sometimes you must stay in the hospital. Most problems happen within the first 24 hours. Side effects may occur up to 7-10 days after the injury.  WHAT ARE THE TYPES OF HEAD INJURIES? Head injuries can be as minor as a bump. Some head injuries can be more severe. More severe head injuries include:  A jarring injury to the brain (concussion).  A bruise of the brain (contusion). This mean there is bleeding in the brain that can cause swelling.  A cracked skull (skull fracture).  Bleeding in the brain that collects, clots, and forms a bump (hematoma). WHEN SHOULD I GET HELP RIGHT AWAY?   You are  confused or sleepy.  You cannot be woken up.  You feel sick to your stomach (nauseous) or keep throwing up (vomiting).  Your dizziness or unsteadiness is getting worse.  You have very bad, lasting headaches that are not helped by medicine. Take medicines only as told by your doctor.  You cannot use your arms or legs like normal.  You cannot walk.  You notice changes in the black spots in the center of the colored part of your eye (pupil).  You have clear or bloody fluid coming from your nose or ears.  You have trouble seeing. During the next 24 hours after the injury, you must stay with someone who  can watch you. This person should get help right away (call 911 in the [place] if you start to shake and are not able to control it (have seizures), you pass out, or you are unable to wake up. HOW CAN I PREVENT A HEAD INJURY IN THE FUTURE?  Wear seat belts.  Wear a helmet while bike riding and playing sports like football.  Stay away from dangerous activities around the house. WHEN CAN I RETURN TO NORMAL ACTIVITIES AND ATHLETICS? See your doctor before doing these activities. You should not do normal activities or play contact sports until 1 week after the following symptoms have stopped:  Headache that does not go away.  Dizziness.  Poor attention.  Confusion.  Memory problems.  Sickness to your stomach or throwing up.  Tiredness.  Fussiness.  Bothered by bright lights or loud noises.  Anxiousness or depression.  Restless sleep. MAKE SURE YOU:   Understand these instructions.  Will watch your condition.  Will get help right away if you are not doing well or get worse.   This information is not intended to replace advice given to you by your health care provider. Make sure you discuss any questions you have with your health care provider.   Document Released: 10/10/2008 Document Revised: 11/18/2014 Document Reviewed: 07/05/2013 Elsevier Interactive Patient  Education [employer].

## 2016-03-31 NOTE — ED Notes (Signed)
Reviewed d/c instructions and follow up care with pt and pt's mother. Pt/mother verbalized understanding

## 2016-05-13 DIAGNOSIS — R6889 Other general symptoms and signs: Secondary | ICD-10-CM | POA: Diagnosis not present

## 2016-05-13 DIAGNOSIS — R531 Weakness: Secondary | ICD-10-CM | POA: Diagnosis not present

## 2016-05-14 ENCOUNTER — Encounter: Payer: Self-pay | Admitting: *Deleted

## 2016-05-14 ENCOUNTER — Emergency Department: Payer: Commercial Managed Care - HMO

## 2016-05-14 ENCOUNTER — Emergency Department
Admission: EM | Admit: 2016-05-14 | Discharge: 2016-05-14 | Disposition: A | Discharge status: Home / Self Care | Payer: Commercial Managed Care - HMO | Attending: Emergency Medicine | Admitting: Emergency Medicine

## 2016-05-14 DIAGNOSIS — M6282 Rhabdomyolysis: Secondary | ICD-10-CM | POA: Diagnosis not present

## 2016-05-14 DIAGNOSIS — E876 Hypokalemia: Secondary | ICD-10-CM | POA: Diagnosis not present

## 2016-05-14 DIAGNOSIS — M1990 Unspecified osteoarthritis, unspecified site: Secondary | ICD-10-CM | POA: Diagnosis not present

## 2016-05-14 DIAGNOSIS — E860 Dehydration: Secondary | ICD-10-CM

## 2016-05-14 DIAGNOSIS — R4182 Altered mental status, unspecified: Secondary | ICD-10-CM | POA: Diagnosis present

## 2016-05-14 DIAGNOSIS — R55 Syncope and collapse: Secondary | ICD-10-CM | POA: Diagnosis not present

## 2016-05-14 LAB — COMPREHENSIVE METABOLIC PANEL
ALBUMIN: 3.8 g/dL (ref 3.5–5.0)
ALK PHOS: 51 U/L (ref 38–126)
ALT: 17 U/L (ref 17–63)
ANION GAP: 7 (ref 5–15)
AST: 40 U/L (ref 15–41)
BUN: 17 mg/dL (ref 6–20)
CALCIUM: 8.7 mg/dL — AB (ref 8.9–10.3)
CO2: 29 mmol/L (ref 22–32)
CREATININE: 1.55 mg/dL — AB (ref 0.61–1.24)
Chloride: 109 mmol/L (ref 101–111)
GFR calc Af Amer: 57 mL/min — ABNORMAL LOW (ref >60)
GFR calc non Af Amer: 49 mL/min — ABNORMAL LOW (ref >60)
GLUCOSE: 126 mg/dL — AB (ref 65–99)
Potassium: 2.9 mmol/L — ABNORMAL LOW (ref 3.5–5.1)
SODIUM: 145 mmol/L (ref 135–145)
Total Bilirubin: 0.4 mg/dL (ref 0.3–1.2)
Total Protein: 7.1 g/dL (ref 6.5–8.1)

## 2016-05-14 LAB — CBC
HCT: 36.6 % — ABNORMAL LOW (ref 40.0–52.0)
HEMOGLOBIN: 12.1 g/dL — AB (ref 13.0–18.0)
MCH: 31 pg (ref 26.0–34.0)
MCHC: 33.2 g/dL (ref 32.0–36.0)
MCV: 93.6 fL (ref 80.0–100.0)
Platelets: 196 10*3/uL (ref 150–440)
RBC: 3.91 MIL/uL — ABNORMAL LOW (ref 4.40–5.90)
RDW: 14.5 % (ref 11.5–14.5)
WBC: 5.8 10*3/uL (ref 3.8–10.6)

## 2016-05-14 LAB — TROPONIN I

## 2016-05-14 LAB — ETHANOL

## 2016-05-14 LAB — AMMONIA: AMMONIA: 28 umol/L (ref 9–35)

## 2016-05-14 LAB — CK: CK TOTAL: 1424 U/L — AB (ref 49–397)

## 2016-05-14 LAB — LACTIC ACID, PLASMA: LACTIC ACID, VENOUS: 1.1 mmol/L (ref 0.5–1.9)

## 2016-05-14 MED ORDER — POTASSIUM CHLORIDE CRYS ER 20 MEQ PO TBCR
40.0000 meq | EXTENDED_RELEASE_TABLET | Freq: Once | ORAL | Status: AC
  Administered 2016-05-14: 40 meq via ORAL
  Filled 2016-05-14: qty 2

## 2016-05-14 MED ORDER — SODIUM CHLORIDE 0.9 % IV BOLUS (SEPSIS)
1000.0000 mL | Freq: Once | INTRAVENOUS | Status: AC
  Administered 2016-05-14: 1000 mL via INTRAVENOUS

## 2016-05-14 NOTE — ED Notes (Signed)
Reviewed d/c instructions, follow-up care with pt and patients care-giver (sister). Pt and sister verbalized understanding

## 2016-05-14 NOTE — ED Notes (Signed)
Family reports pt was found in the bathroom at [employer] tonight at 32.  Stool was on pt.  Pt has not eaten since noon.  Pt with weakness.  Pt reports lower back pain.  Hx of arthritis in back.  Pt denies fall injury today.  No loc.  Speech clear.  Family with pt.  Iv started and labs sent.

## 2016-05-14 NOTE — ED Notes (Signed)
Called lab to check status of CK results. Was informed specimen running, would be done shortly

## 2016-05-14 NOTE — ED Notes (Signed)
Pt brought in via ems from [employer].  Ems reports pt was missing since 1230 today and was found in the bathroom with stool on him.  Pt tried to walk and was weak.  Pt alert.  Hx down syndrome

## 2016-05-14 NOTE — ED Notes (Signed)
Pt sleeping.  Family with pt 

## 2016-05-14 NOTE — Discharge Instructions (Signed)
1. Drink plenty of fluids daily. 2. Avoid the excessive heat and stay indoors and air conditioning. 3. Return to the ER for worsening symptoms, persistent vomiting, not urinating or other concerns.  Dehydration, Adult Dehydration is a condition in which you do not have enough fluid or water in your body. It happens when you take in less fluid than you lose. Vital organs such as the kidneys, brain, and heart cannot function without a proper amount of fluids. Any loss of fluids from the body can cause dehydration.  Dehydration can range from mild to severe. This condition should be treated right away to help prevent it from becoming severe. CAUSES  This condition may be caused by:  Vomiting.  Diarrhea.  Excessive sweating, such as when exercising in hot or humid weather.  Not drinking enough fluid during strenuous exercise or during an illness.  Excessive urine output.  Fever.  Certain medicines. RISK FACTORS This condition is more likely to develop in:  People who are taking certain medicines that cause the body to lose excess fluid (diuretics).   People who have a chronic illness, such as diabetes, that may increase urination.  Older adults.   People who live at high altitudes.   People who participate in endurance sports.  SYMPTOMS  Mild Dehydration  Thirst.  Dry lips.  Slightly dry mouth.  Dry, warm skin. Moderate Dehydration  Very dry mouth.   Muscle cramps.   Dark urine and decreased urine production.   Decreased tear production.   Headache.   Light-headedness, especially when you stand up from a sitting position.  Severe Dehydration  Changes in skin.   Cold and clammy skin.   Skin does not spring back quickly when lightly pinched and released.   Changes in body fluids.   Extreme thirst.   No tears.   Not able to sweat when body temperature is high, such as in hot weather.   Minimal urine production.   Changes in  vital signs.   Rapid, weak pulse (more than 100 beats per minute when you are sitting still).   Rapid breathing.   Low blood pressure.   Other changes.   Sunken eyes.   Cold hands and feet.   Confusion.  Lethargy and difficulty being awakened.  Fainting (syncope).   Short-term weight loss.   Unconsciousness. DIAGNOSIS  This condition may be diagnosed based on your symptoms. You may also have tests to determine how severe your dehydration is. These tests may include:   Urine tests.   Blood tests.  TREATMENT  Treatment for this condition depends on the severity. Mild or moderate dehydration can often be treated at home. Treatment should be started right away. Do not wait until dehydration becomes severe. Severe dehydration needs to be treated at the hospital. Treatment for Mild Dehydration  Drinking plenty of water to replace the fluid you have lost.   Replacing minerals in your blood (electrolytes) that you may have lost.  Treatment for Moderate Dehydration  Consuming oral rehydration solution (ORS). Treatment for Severe Dehydration  Receiving fluid through an IV tube.   Receiving electrolyte solution through a feeding tube that is passed through your nose and into your stomach (nasogastric tube or NG tube).  Correcting any abnormalities in electrolytes. HOME CARE INSTRUCTIONS   Drink enough fluid to keep your urine clear or pale yellow.   Drink water or fluid slowly by taking small sips. You can also try sucking on ice cubes.  Have food or beverages that  contain electrolytes. Examples include bananas and sports drinks.  Take over-the-counter and prescription medicines only as told by your health care provider.   Prepare ORS according to the manufacturer's instructions. Take sips of ORS every 5 minutes until your urine returns to normal.  If you have vomiting or diarrhea, continue to try to drink water, ORS, or both.   If you have  diarrhea, avoid:   Beverages that contain caffeine.   Fruit juice.   Milk.   Carbonated soft drinks.  Do not take salt tablets. This can lead to the condition of having too much sodium in your body (hypernatremia).  SEEK MEDICAL CARE IF:  You cannot eat or drink without vomiting.  You have had moderate diarrhea during a period of more than 24 hours.  You have a fever. SEEK IMMEDIATE MEDICAL CARE IF:   You have extreme thirst.  You have severe diarrhea.  You have not urinated in 6-8 hours, or you have urinated only a small amount of very dark urine.  You have shriveled skin.  You are dizzy, confused, or both.   This information is not intended to replace advice given to you by your health care provider. Make sure you discuss any questions you have with your health care provider.   Document Released: 10/28/2005 Document Revised: 07/19/2015 Document Reviewed: 03/15/2015 Elsevier Interactive Patient Education 2016 [employer].  Hypokalemia Hypokalemia means that the amount of potassium in the blood is lower than normal.Potassium is a chemical, called an electrolyte, that helps regulate the amount of fluid in the body. It also stimulates muscle contraction and helps nerves function properly.Most of the body's potassium is inside of cells, and only a very small amount is in the blood. Because the amount in the blood is so small, minor changes can be life-threatening. CAUSES  Antibiotics.  Diarrhea or vomiting.  Using laxatives too much, which can cause diarrhea.  Chronic kidney disease.  Water pills (diuretics).  Eating disorders (bulimia).  Low magnesium level.  Sweating a lot. SIGNS AND SYMPTOMS  Weakness.  Constipation.  Fatigue.  Muscle cramps.  Mental confusion.  Skipped heartbeats or irregular heartbeat (palpitations).  Tingling or numbness. DIAGNOSIS  Your health care provider can diagnose hypokalemia with blood tests. In addition to  checking your potassium level, your health care provider may also check other lab tests. TREATMENT Hypokalemia can be treated with potassium supplements taken by mouth or adjustments in your current medicines. If your potassium level is very low, you may need to get potassium through a vein (IV) and be monitored in the hospital. A diet high in potassium is also helpful. Foods high in potassium are:  Nuts, such as peanuts and pistachios.  Seeds, such as sunflower seeds and pumpkin seeds.  Peas, lentils, and lima beans.  Whole grain and bran cereals and breads.  Fresh fruit and vegetables, such as apricots, avocado, bananas, cantaloupe, kiwi, oranges, tomatoes, asparagus, and potatoes.  Orange and tomato juices.  Red meats.  Fruit yogurt. HOME CARE INSTRUCTIONS  Take all medicines as prescribed by your health care provider.  Maintain a healthy diet by including nutritious food, such as fruits, vegetables, nuts, whole grains, and lean meats.  If you are taking a laxative, be sure to follow the directions on the label. SEEK MEDICAL CARE IF:  Your weakness gets worse.  You feel your heart pounding or racing.  You are vomiting or having diarrhea.  You are diabetic and having trouble keeping your blood glucose in the normal  range. SEEK IMMEDIATE MEDICAL CARE IF:  You have chest pain, shortness of breath, or dizziness.  You are vomiting or having diarrhea for more than 2 days.  You faint. MAKE SURE YOU:   Understand these instructions.  Will watch your condition.  Will get help right away if you are not doing well or get worse.   This information is not intended to replace advice given to you by your health care provider. Make sure you discuss any questions you have with your health care provider.   Document Released: 10/28/2005 Document Revised: 11/18/2014 Document Reviewed: 04/30/2013 Elsevier Interactive Patient Education 2016 [employer] Inc[employer]  Rehydration,  Adult Rehydration is the replacement of body fluids lost during dehydration. Dehydration is an extreme loss of body fluids to the point of body function impairment. There are many ways extreme fluid loss can occur, including vomiting, diarrhea, or excess sweating. Recovering from dehydration requires replacing lost fluids, continuing to eat to maintain strength, and avoiding foods and beverages that may contribute to further fluid loss or may increase nausea. HOW TO REHYDRATE In most cases, rehydration involves the replacement of not only fluids but also carbohydrates and basic body salts. Rehydration with an oral rehydration solution is one way to replace essential nutrients lost through dehydration. An oral rehydration solution can be purchased at pharmacies, retail stores, and online. Premixed packets of powder that you combine with water to make a solution are also sold. You can prepare an oral rehydration solution at home by mixing the following ingredients together:    - tsp table salt.   tsp baking soda.   tsp salt substitute containing potassium chloride.  1 tablespoons sugar.  1 L (34 oz) of water. Be sure to use exact measurements. Including too much sugar can make diarrhea worse. Drink -1 cup (120-240 mL) of oral rehydration solution each time you have diarrhea or vomit. If drinking this amount makes your vomiting worse, try drinking smaller amounts more often. For example, drink 1-3 tsp every 5-10 minutes.  A general rule for staying hydrated is to drink 1-2 L of fluid per day. Talk to your caregiver about the specific amount you should be drinking each day. Drink enough fluids to keep your urine clear or pale yellow. EATING WHEN DEHYDRATED Even if you have had severe sweating or you are having diarrhea, do not stop eating. Many healthy items in a normal diet are okay to continue eating while recovering from dehydration. The following tips can help you to lessen nausea when you  eat:  Ask someone else to prepare your food. Cooking smells may worsen nausea.  Eat in a well-ventilated room away from cooking smells.  Sit up when you eat. Avoid lying down until 1-2 hours after eating.  Eat small amounts when you eat.  Eat foods that are easy to digest. These include soft, well-cooked, or mashed foods. FOODS AND BEVERAGES TO AVOID Avoid eating or drinking the following foods and beverages that may increase nausea or further loss of fluid:   Fruit juices with a high sugar content, such as concentrated juices.  Alcohol.  Beverages containing caffeine.  Carbonated drinks. They may cause a lot of gas.  Foods that may cause a lot of gas, such as cabbage, broccoli, and beans.  Fatty, greasy, and fried foods.  Spicy, very salty, and very sweet foods or drinks.  Foods or drinks that are very hot or very cold. Consume food or drinks at or near room temperature.  Foods that need  a lot of chewing, such as raw vegetables.  Foods that are sticky or hard to swallow, such as peanut butter.   This information is not intended to replace advice given to you by your health care provider. Make sure you discuss any questions you have with your health care provider.   Document Released: 01/20/2012 Document Revised: 07/22/2012 Document Reviewed: 01/20/2012 Elsevier Interactive Patient Education 2016 [employer].  Rhabdomyolysis Rhabdomyolysis is a condition that results when muscle cells break down and release substances into the blood that can damage the kidneys. It happens because of damage to the muscles that move bones (skeletal muscle). When you damage this type of muscle, substances inside of your muscle cells are released into your blood. This includes a certain protein called myoglobin. Your kidneys must filter myoglobin from your blood. Large amounts of myoglobin can cause kidney damage or kidney failure. Other substances that are released by muscle cells may upset  the balance of the minerals (electrolytes) in your blood. This makes your blood become too acidic (acidosis). CAUSES This condition is caused by muscle damage. Muscle damage often results from:  Extreme overuse of the muscles.  An injury that crushes or compresses a muscle.  Use of illegal drugs, especially cocaine.  Alcohol abuse. Other possible causes include:  Prescription medicines, such as statins, amphetamines, and opiates.  Infections.  Inherited muscle diseases.  High fever.  Heatstroke.  Dehydration.  Seizures.  Surgery. RISK FACTORS This condition is more likely to develop in:  People who have a family history of muscle disease.  People who participate in extreme sports, such as marathon running.  People who have diabetes.  Older people.  People who abuse drugs or alcohol. SYMPTOMS Symptoms of this condition vary. Some people have very few symptoms, while others have many symptoms. The most common symptoms include:  Muscle pain and swelling.  Muscle weakness.  Dark urine.  Feeling weak and tired. Other symptoms include:  Nausea and vomiting.  Fever.  Pain in the abdomen.  Joint pain. Signs and symptoms of complications from rhabdomyolysis may include:  Heart rhythm abnormalities (arrhythmias).  Seizures.  Reduced urine production because of kidney failure.  Very low blood pressure (shock).  Uncontrolled bleeding. DIAGNOSIS This condition may be diagnosed based on:  Your symptoms and medical history.  A physical exam.  Blood tests to check for:  Muscle breakdown products in the blood (creatine kinase).  Myoglobin.  Acidosis.  Electrolyte imbalances.  Urine tests to check for myoglobin. You may also have other tests to check for causes of muscle damage and to check for complications. TREATMENT Treatment for this condition focuses on keeping up your fluid level, reversing acidosis, and protecting your kidneys. Treatment  may include:  Fluids and medicines given through an IV tube that is inserted into one of your veins.  Medicines, such as:  Sodium bicarbonate to reduce acidosis.  Electrolytes to restore the balance of these minerals in your body.  Hemodialysis. This treatment uses an artificial kidney machine to filter your blood while you recover. You may have this if other treatments are not helping. HOME CARE INSTRUCTIONS  Take medicines only as directed by your health care provider.  Rest at home until your health care provider says that you can return to your normal activities.  Drink enough fluid to keep your urine clear or pale yellow.  Do not exercise with great energy and effort (strenuously). Ask your health care provider what level of exercise is safe for you.  Do not abuse drugs or alcohol. If you are struggling with drug or alcohol use, ask your health care provider for help.  Keep all follow-up visits as directed by your health care provider. This is important. SEEK MEDICAL CARE IF:  You develop symptoms of rhabdomyolysis at home after treatment. SEEK IMMEDIATE MEDICAL CARE IF:  You have a seizure.  You bleed easily or cannot control bleeding.  You cannot make urine.  You have chest pain.  You have trouble breathing.   This information is not intended to replace advice given to you by your health care provider. Make sure you discuss any questions you have with your health care provider.   Document Released: 10/10/2004 Document Revised: 03/14/2015 Document Reviewed: 11/02/2014 Elsevier Interactive Patient Education [employer].
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
## 2016-07-18 ENCOUNTER — Ambulatory Visit (INDEPENDENT_AMBULATORY_CARE_PROVIDER_SITE_OTHER): Payer: Commercial Managed Care - HMO | Admitting: Family Medicine

## 2016-07-18 ENCOUNTER — Encounter: Payer: Self-pay | Admitting: Family Medicine

## 2016-07-18 VITALS — BP 120/74 | HR 85 | Temp 98.3°F | Resp 18 | Ht 63.0 in | Wt 145.2 lb

## 2016-07-18 DIAGNOSIS — R296 Repeated falls: Secondary | ICD-10-CM

## 2016-07-18 DIAGNOSIS — R030 Elevated blood-pressure reading, without diagnosis of hypertension: Secondary | ICD-10-CM

## 2016-07-18 DIAGNOSIS — Z741 Need for assistance with personal care: Secondary | ICD-10-CM | POA: Diagnosis not present
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
## 2016-07-24 ENCOUNTER — Ambulatory Visit: Payer: Commercial Managed Care - HMO | Admitting: Physical Therapy

## 2016-07-28 ENCOUNTER — Encounter: Payer: Self-pay | Admitting: Gynecology

## 2016-07-28 ENCOUNTER — Encounter: Payer: Self-pay | Admitting: Emergency Medicine

## 2016-07-28 ENCOUNTER — Ambulatory Visit (INDEPENDENT_AMBULATORY_CARE_PROVIDER_SITE_OTHER)
Admission: EM | Admit: 2016-07-28 | Discharge: 2016-07-28 | Disposition: A | Discharge status: Other Acute Inpatient Hospital | Payer: Commercial Managed Care - HMO | Source: Home / Self Care | Attending: Family Medicine | Admitting: Family Medicine

## 2016-07-28 ENCOUNTER — Emergency Department
Admission: EM | Admit: 2016-07-28 | Discharge: 2016-07-28 | Disposition: A | Discharge status: Home / Self Care | Payer: Commercial Managed Care - HMO | Attending: Emergency Medicine | Admitting: Emergency Medicine

## 2016-07-28 ENCOUNTER — Emergency Department: Payer: Commercial Managed Care - HMO

## 2016-07-28 DIAGNOSIS — Q909 Down syndrome, unspecified: Secondary | ICD-10-CM | POA: Insufficient documentation

## 2016-07-28 DIAGNOSIS — R1084 Generalized abdominal pain: Secondary | ICD-10-CM | POA: Diagnosis not present

## 2016-07-28 DIAGNOSIS — E860 Dehydration: Secondary | ICD-10-CM | POA: Diagnosis not present

## 2016-07-28 DIAGNOSIS — R109 Unspecified abdominal pain: Secondary | ICD-10-CM | POA: Diagnosis not present

## 2016-07-28 DIAGNOSIS — R197 Diarrhea, unspecified: Secondary | ICD-10-CM | POA: Diagnosis not present

## 2016-07-28 DIAGNOSIS — K529 Noninfective gastroenteritis and colitis, unspecified: Secondary | ICD-10-CM

## 2016-07-28 DIAGNOSIS — R5383 Other fatigue: Secondary | ICD-10-CM

## 2016-07-28 DIAGNOSIS — I959 Hypotension, unspecified: Secondary | ICD-10-CM

## 2016-07-28 HISTORY — DX: Other intervertebral disc degeneration, lumbar region without mention of lumbar back pain or lower extremity pain: M51.369

## 2016-07-28 HISTORY — DX: Other intervertebral disc degeneration, lumbar region: M51.36

## 2016-07-28 LAB — COMPREHENSIVE METABOLIC PANEL
ALT: 17 U/L (ref 17–63)
ANION GAP: 4 — AB (ref 5–15)
AST: 28 U/L (ref 15–41)
Albumin: 3.4 g/dL — ABNORMAL LOW (ref 3.5–5.0)
Alkaline Phosphatase: 50 U/L (ref 38–126)
BUN: 15 mg/dL (ref 6–20)
CHLORIDE: 105 mmol/L (ref 101–111)
CO2: 31 mmol/L (ref 22–32)
Calcium: 8.4 mg/dL — ABNORMAL LOW (ref 8.9–10.3)
Creatinine, Ser: 1.38 mg/dL — ABNORMAL HIGH (ref 0.61–1.24)
GFR, EST NON AFRICAN AMERICAN: 57 mL/min — AB (ref >60)
Glucose, Bld: 107 mg/dL — ABNORMAL HIGH (ref 65–99)
POTASSIUM: 3.1 mmol/L — AB (ref 3.5–5.1)
SODIUM: 140 mmol/L (ref 135–145)
Total Bilirubin: 0.6 mg/dL (ref 0.3–1.2)
Total Protein: 7.1 g/dL (ref 6.5–8.1)

## 2016-07-28 LAB — CBC
HEMATOCRIT: 38.4 % — AB (ref 40.0–52.0)
HEMOGLOBIN: 13 g/dL (ref 13.0–18.0)
MCH: 31.4 pg (ref 26.0–34.0)
MCHC: 33.9 g/dL (ref 32.0–36.0)
MCV: 92.6 fL (ref 80.0–100.0)
PLATELETS: 169 10*3/uL (ref 150–440)
RBC: 4.15 MIL/uL — AB (ref 4.40–5.90)
RDW: 14.4 % (ref 11.5–14.5)
WBC: 4.1 10*3/uL (ref 3.8–10.6)

## 2016-07-28 LAB — LIPASE, BLOOD: LIPASE: 24 U/L (ref 11–51)

## 2016-07-28 MED ORDER — ONDANSETRON 4 MG PO TBDP: 4.0000 mg | ORAL_TABLET | Freq: Three times a day (TID) | ORAL | 0 refills | Status: DC | PRN

## 2016-07-28 MED ORDER — SODIUM CHLORIDE 0.9 % IV BOLUS (SEPSIS)
1000.0000 mL | Freq: Once | INTRAVENOUS | Status: AC
  Administered 2016-07-28: 1000 mL via INTRAVENOUS

## 2016-07-28 MED ORDER — METRONIDAZOLE 500 MG PO TABS: 500.0000 mg | ORAL_TABLET | Freq: Three times a day (TID) | ORAL | 0 refills | Status: DC

## 2016-07-28 MED ORDER — IOPAMIDOL (ISOVUE-300) INJECTION 61%
30.0000 mL | Freq: Once | INTRAVENOUS | Status: AC | PRN
  Administered 2016-07-28: 30 mL via ORAL

## 2016-07-28 MED ORDER — ONDANSETRON HCL 4 MG/2ML IJ SOLN
4.0000 mg | Freq: Once | INTRAMUSCULAR | Status: AC
  Administered 2016-07-28: 4 mg via INTRAVENOUS
  Filled 2016-07-28: qty 2

## 2016-07-28 MED ORDER — CIPROFLOXACIN HCL 500 MG PO TABS: 500.0000 mg | ORAL_TABLET | Freq: Two times a day (BID) | ORAL | 0 refills | Status: DC

## 2016-07-28 MED ORDER — IOPAMIDOL (ISOVUE-300) INJECTION 61%
100.0000 mL | Freq: Once | INTRAVENOUS | Status: AC | PRN
  Administered 2016-07-28: 100 mL via INTRAVENOUS
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
## 2016-07-28 NOTE — ED Triage Notes (Addendum)
Pt arrived via EMS from Urgent Care with c/o patient having low BP. Pt has Down's Syndrome and does not speak much. Pt was given about 200 cc of fluid with EMS. Pt has been having diarrhea for several days and not feeling well since Monday. Pt was given pain pill from sister that was not his to help with his back pain.  Per EMS patient's heart rate runs in the 40s-50s, but BP was stable.

## 2016-07-28 NOTE — ED Notes (Signed)
This RN to bedside, pt done with oral contrast, CT notified of patient being done with contrast.

## 2016-07-28 NOTE — Discharge Instructions (Signed)
Patient going to the ED for further eval.

## 2016-07-28 NOTE — ED Notes (Signed)
Pt returned from CT °
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
## 2016-07-28 NOTE — ED Triage Notes (Signed)
Per caregiver, patient with loss of appetite , loose bowel movement after eaten / sleeping a lot and also with cold symptoms.

## 2016-07-28 NOTE — ED Triage Notes (Signed)
Seen by Urgent Care today [facility]).  Sister states patient has been having diarrhea after eating for the past 4 days.  Also patient has been "laying around sleeping, loss of appetite_ - for the past 4 days.  Sent to ED for evaluation due to low blood pressure at Urgent Care (70-40) and heart rate 48-56.  Patient has a 22 g saline lock placed in left AC.

## 2016-07-30 ENCOUNTER — Emergency Department
Admission: EM | Admit: 2016-07-30 | Discharge: 2016-07-30 | Disposition: A | Discharge status: Home / Self Care | Payer: Commercial Managed Care - HMO | Attending: Emergency Medicine | Admitting: Emergency Medicine

## 2016-07-30 ENCOUNTER — Encounter: Payer: Self-pay | Admitting: Family Medicine

## 2016-07-30 ENCOUNTER — Emergency Department: Payer: Commercial Managed Care - HMO

## 2016-07-30 ENCOUNTER — Ambulatory Visit (INDEPENDENT_AMBULATORY_CARE_PROVIDER_SITE_OTHER): Payer: Commercial Managed Care - HMO | Admitting: Family Medicine

## 2016-07-30 DIAGNOSIS — E860 Dehydration: Secondary | ICD-10-CM | POA: Diagnosis not present

## 2016-07-30 DIAGNOSIS — I959 Hypotension, unspecified: Secondary | ICD-10-CM | POA: Insufficient documentation

## 2016-07-30 DIAGNOSIS — I950 Idiopathic hypotension: Secondary | ICD-10-CM

## 2016-07-30 DIAGNOSIS — K529 Noninfective gastroenteritis and colitis, unspecified: Secondary | ICD-10-CM | POA: Diagnosis not present

## 2016-07-30 DIAGNOSIS — R112 Nausea with vomiting, unspecified: Secondary | ICD-10-CM | POA: Diagnosis present

## 2016-07-30 DIAGNOSIS — I10 Essential (primary) hypertension: Secondary | ICD-10-CM | POA: Diagnosis not present

## 2016-07-30 LAB — CBC WITH DIFFERENTIAL/PLATELET
BASOS ABS: 0 10*3/uL (ref 0–0.1)
BASOS PCT: 1 %
EOS ABS: 0 10*3/uL (ref 0–0.7)
EOS PCT: 1 %
HCT: 37.8 % — ABNORMAL LOW (ref 40.0–52.0)
Hemoglobin: 12.9 g/dL — ABNORMAL LOW (ref 13.0–18.0)
Lymphocytes Relative: 20 %
Lymphs Abs: 0.8 10*3/uL — ABNORMAL LOW (ref 1.0–3.6)
MCH: 31.4 pg (ref 26.0–34.0)
MCHC: 34.2 g/dL (ref 32.0–36.0)
MCV: 91.7 fL (ref 80.0–100.0)
MONO ABS: 0.4 10*3/uL (ref 0.2–1.0)
Monocytes Relative: 10 %
Neutro Abs: 2.7 10*3/uL (ref 1.4–6.5)
Neutrophils Relative %: 69 %
PLATELETS: 195 10*3/uL (ref 150–440)
RBC: 4.12 MIL/uL — ABNORMAL LOW (ref 4.40–5.90)
RDW: 14.1 % (ref 11.5–14.5)
WBC: 3.9 10*3/uL (ref 3.8–10.6)

## 2016-07-30 LAB — COMPREHENSIVE METABOLIC PANEL
ALBUMIN: 3.4 g/dL — AB (ref 3.5–5.0)
ALK PHOS: 53 U/L (ref 38–126)
ALT: 16 U/L — ABNORMAL LOW (ref 17–63)
ANION GAP: 4 — AB (ref 5–15)
AST: 24 U/L (ref 15–41)
BILIRUBIN TOTAL: 0.6 mg/dL (ref 0.3–1.2)
BUN: 13 mg/dL (ref 6–20)
CALCIUM: 8.4 mg/dL — AB (ref 8.9–10.3)
CO2: 32 mmol/L (ref 22–32)
Chloride: 105 mmol/L (ref 101–111)
Creatinine, Ser: 1.56 mg/dL — ABNORMAL HIGH (ref 0.61–1.24)
GFR calc Af Amer: 56 mL/min — ABNORMAL LOW (ref >60)
GFR calc non Af Amer: 49 mL/min — ABNORMAL LOW (ref >60)
GLUCOSE: 80 mg/dL (ref 65–99)
Potassium: 3.2 mmol/L — ABNORMAL LOW (ref 3.5–5.1)
Sodium: 141 mmol/L (ref 135–145)
TOTAL PROTEIN: 6.7 g/dL (ref 6.5–8.1)

## 2016-07-30 LAB — URINALYSIS COMPLETE WITH MICROSCOPIC (ARMC ONLY)
BILIRUBIN URINE: NEGATIVE
Bacteria, UA: NONE SEEN
GLUCOSE, UA: NEGATIVE mg/dL
Ketones, ur: NEGATIVE mg/dL
NITRITE: NEGATIVE
Protein, ur: NEGATIVE mg/dL
SPECIFIC GRAVITY, URINE: 1.011 (ref 1.005–1.030)
pH: 6 (ref 5.0–8.0)

## 2016-07-30 LAB — TROPONIN I: Troponin I: <0.03 ng/mL (ref <0.03)

## 2016-07-30 LAB — CK: CK TOTAL: 119 U/L (ref 49–397)

## 2016-07-30 MED ORDER — SODIUM CHLORIDE 0.9 % IV BOLUS (SEPSIS)
1000.0000 mL | Freq: Once | INTRAVENOUS | Status: AC
Start: 2016-07-30 — End: 2016-07-30
  Administered 2016-07-30: 1000 mL via INTRAVENOUS

## 2016-07-30 MED ORDER — SODIUM CHLORIDE 0.9 % IV SOLN
Freq: Once | INTRAVENOUS | Status: AC
  Administered 2016-07-30: 12:00:00 via INTRAVENOUS

## 2016-07-30 NOTE — ED Notes (Signed)
Pt does not have to urinate at this time.  Urinal provided and family instructed to let RN know when pt needs to go so that we can send down specimen.
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
## 2016-07-30 NOTE — ED Notes (Signed)
Pt in via triage; pt sent over from PCP due to hypotension.  Pt currently on antibiotic treatment for a colon infection, was seeing PCP as follow up for that.  Pt has had to come in to ER recently to receive fluids due to low BP, pt family reports multiple falls over the last month as well that he has been evaluated for but with no underlying cause.  Pt A/Ox3, vitals WDL, no immediate distress noted at this time.  Pt with downs syndrome, pt sister is primary care giver, two sisters at bedside at this time.

## 2016-07-30 NOTE — ED Triage Notes (Signed)
Pt arrives from [place] - Dr Trena Platt office with reports that he was there today for a follow up and when they checked his VS his BP was low  Pt with low BP in traige  76/45  No verbalized needs or concerns from the pt  Family reports that he takes no BP meds or meds for heart rate control
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
## 2016-07-31 ENCOUNTER — Ambulatory Visit: Payer: Commercial Managed Care - HMO | Admitting: Physical Therapy

## 2016-08-01 LAB — URINE CULTURE: Culture: NO GROWTH

## 2016-08-09 ENCOUNTER — Telehealth: Payer: Self-pay | Admitting: Family Medicine

## 2016-08-09 NOTE — Telephone Encounter (Signed)
Left message asking for patient's caregiver to return call

## 2016-08-09 NOTE — Telephone Encounter (Signed)
Pt would like a call back please. °

## 2016-08-13 ENCOUNTER — Encounter: Payer: Self-pay | Admitting: Family Medicine

## 2016-08-13 ENCOUNTER — Ambulatory Visit (INDEPENDENT_AMBULATORY_CARE_PROVIDER_SITE_OTHER): Payer: Commercial Managed Care - HMO | Admitting: Family Medicine

## 2016-08-13 VITALS — BP 122/78 | HR 83 | Temp 97.9°F | Resp 16 | Ht 63.0 in | Wt 143.2 lb

## 2016-08-13 DIAGNOSIS — R5383 Other fatigue: Secondary | ICD-10-CM | POA: Diagnosis not present

## 2016-08-13 DIAGNOSIS — A09 Infectious gastroenteritis and colitis, unspecified: Secondary | ICD-10-CM

## 2016-08-13 DIAGNOSIS — K529 Noninfective gastroenteritis and colitis, unspecified: Secondary | ICD-10-CM

## 2016-08-13 LAB — CBC WITH DIFFERENTIAL/PLATELET
BASOS ABS: 96 {cells}/uL (ref 0–200)
Basophils Relative: 3 %
Eosinophils Absolute: 96 cells/uL (ref 15–500)
Eosinophils Relative: 3 %
HEMATOCRIT: 37.2 % — AB (ref 38.5–50.0)
HEMOGLOBIN: 12.1 g/dL — AB (ref 13.2–17.1)
LYMPHS ABS: 1024 {cells}/uL (ref 850–3900)
Lymphocytes Relative: 32 %
MCH: 30.4 pg (ref 27.0–33.0)
MCHC: 32.5 g/dL (ref 32.0–36.0)
MCV: 93.5 fL (ref 80.0–100.0)
MONO ABS: 544 {cells}/uL (ref 200–950)
MPV: 10 fL (ref 7.5–12.5)
Monocytes Relative: 17 %
NEUTROS PCT: 45 %
Neutro Abs: 1440 cells/uL — ABNORMAL LOW (ref 1500–7800)
Platelets: 230 10*3/uL (ref 140–400)
RBC: 3.98 MIL/uL — ABNORMAL LOW (ref 4.20–5.80)
RDW: 14.9 % (ref 11.0–15.0)
WBC: 3.2 10*3/uL — ABNORMAL LOW (ref 3.8–10.8)

## 2016-08-13 LAB — COMPLETE METABOLIC PANEL WITH GFR
ALBUMIN: 3.5 g/dL — AB (ref 3.6–5.1)
ALK PHOS: 49 U/L (ref 40–115)
ALT: 14 U/L (ref 9–46)
AST: 20 U/L (ref 10–35)
BUN: 11 mg/dL (ref 7–25)
CALCIUM: 8.4 mg/dL — AB (ref 8.6–10.3)
CO2: 29 mmol/L (ref 20–31)
Chloride: 98 mmol/L (ref 98–110)
Creat: 1.25 mg/dL (ref 0.70–1.33)
GFR, EST AFRICAN AMERICAN: 75 mL/min (ref >=60)
GFR, EST NON AFRICAN AMERICAN: 65 mL/min (ref >=60)
Glucose, Bld: 88 mg/dL (ref 65–99)
POTASSIUM: 4 mmol/L (ref 3.5–5.3)
Sodium: 138 mmol/L (ref 135–146)
Total Bilirubin: 0.6 mg/dL (ref 0.2–1.2)
Total Protein: 6.4 g/dL (ref 6.1–8.1)

## 2016-08-13 LAB — TSH: TSH: 1.06 m[IU]/L (ref 0.40–4.50)
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
## 2016-08-14 LAB — VITAMIN B12: Vitamin B-12: 466 pg/mL (ref 200–1100)

## 2016-08-14 LAB — VITAMIN D 25 HYDROXY (VIT D DEFICIENCY, FRACTURES): VIT D 25 HYDROXY: 16 ng/mL — AB (ref 30–100)

## 2016-08-21 ENCOUNTER — Telehealth: Payer: Self-pay | Admitting: Family Medicine

## 2016-08-21 NOTE — Telephone Encounter (Signed)
Pt states he would like results for his lab work.

## 2016-08-26 ENCOUNTER — Other Ambulatory Visit: Payer: Self-pay | Admitting: Emergency Medicine

## 2016-08-26 DIAGNOSIS — D72819 Decreased white blood cell count, unspecified: Secondary | ICD-10-CM

## 2016-08-26 MED ORDER — VITAMIN D (ERGOCALCIFEROL) 1.25 MG (50000 UNIT) PO CAPS: 50000.0000 [IU] | ORAL_CAPSULE | ORAL | 0 refills | Status: DC

## 2016-08-26 NOTE — Progress Notes (Unsigned)
Sister notified of labs and order placed.

## 2016-09-12 ENCOUNTER — Other Ambulatory Visit: Payer: Self-pay

## 2016-09-12 ENCOUNTER — Telehealth: Payer: Self-pay

## 2016-09-12 DIAGNOSIS — Z1211 Encounter for screening for malignant neoplasm of colon: Secondary | ICD-10-CM

## 2016-09-12 MED ORDER — NA SULFATE-K SULFATE-MG SULF 17.5-3.13-1.6 GM/177ML PO SOLN: 1.0000 | ORAL | 0 refills | Status: DC

## 2016-09-12 NOTE — Telephone Encounter (Signed)
Authorization sent to [employer]. Waiting for fax back

## 2016-09-12 NOTE — Telephone Encounter (Signed)
Insurance has been verified.  Orders placed.  Paperwork printed and available at [employer] in [place] for patient's sister/POA to pick up tomorrow per her Preference.  Suprep sent to preferred pharmacy at this time.

## 2016-09-12 NOTE — Telephone Encounter (Signed)
Gastroenterology Pre-Procedure Review  Request Date: 09/19/16 Requesting Physician: Dr. Vicente Males  PATIENT REVIEW QUESTIONS: The patient responded to the following health history questions as indicated:    1. Are you having any GI issues? no 2. Do you have a personal history of Polyps? no 3. Do you have a family history of Colon Cancer or Polyps? yes (Mother had benign polyps) 4. Diabetes Mellitus? no 5. Joint replacements in the past 12 months?no 6. Major health problems in the past 3 months?no 7. Any artificial heart valves, MVP, or defibrillator?no    MEDICATIONS & ALLERGIES:    Patient reports the following regarding taking any anticoagulation/antiplatelet therapy:   Plavix, Coumadin, Eliquis, Xarelto, Lovenox, Pradaxa, Brilinta, or Effient? no Aspirin? no  Patient confirms/reports the following medications:  Current Outpatient Prescriptions  Medication Sig Dispense Refill  . Vitamin D, Ergocalciferol, (DRISDOL) 50000 units CAPS capsule Take 1 capsule (50,000 Units total) by mouth every 7 (seven) days. 12 capsule 0   No current facility-administered medications for this visit.     Patient confirms/reports the following allergies:  No Known Allergies  No orders of the defined types were placed in this encounter.   AUTHORIZATION INFORMATION Primary Insurance: 1D#: Group #:  Secondary Insurance: 1D#: Group #:  SCHEDULE INFORMATION: Date:  09/19/16 Time: Location: [place]

## 2016-09-13 NOTE — Telephone Encounter (Signed)
[employer] management called, authorization is not required

## 2016-09-19 ENCOUNTER — Encounter
Admission: RE | Disposition: A | Discharge status: Home / Self Care | Payer: Self-pay | Source: Ambulatory Visit | Attending: Gastroenterology

## 2016-09-19 ENCOUNTER — Ambulatory Visit
Admission: RE | Admit: 2016-09-19 | Discharge: 2016-09-19 | Disposition: A | Discharge status: Home / Self Care | Payer: Commercial Managed Care - HMO | Source: Ambulatory Visit | Attending: Gastroenterology | Admitting: Gastroenterology

## 2016-09-19 ENCOUNTER — Encounter: Payer: Self-pay | Admitting: *Deleted

## 2016-09-19 ENCOUNTER — Ambulatory Visit: Payer: Commercial Managed Care - HMO | Admitting: Anesthesiology

## 2016-09-19 DIAGNOSIS — R197 Diarrhea, unspecified: Secondary | ICD-10-CM

## 2016-09-19 DIAGNOSIS — Z8371 Family history of colonic polyps: Secondary | ICD-10-CM | POA: Diagnosis not present

## 2016-09-19 DIAGNOSIS — K529 Noninfective gastroenteritis and colitis, unspecified: Secondary | ICD-10-CM | POA: Insufficient documentation

## 2016-09-19 DIAGNOSIS — K635 Polyp of colon: Secondary | ICD-10-CM | POA: Diagnosis not present

## 2016-09-19 DIAGNOSIS — Q909 Down syndrome, unspecified: Secondary | ICD-10-CM | POA: Diagnosis not present

## 2016-09-19 DIAGNOSIS — D124 Benign neoplasm of descending colon: Secondary | ICD-10-CM | POA: Diagnosis not present

## 2016-09-19 DIAGNOSIS — M1990 Unspecified osteoarthritis, unspecified site: Secondary | ICD-10-CM | POA: Insufficient documentation

## 2016-09-19 DIAGNOSIS — Z1211 Encounter for screening for malignant neoplasm of colon: Secondary | ICD-10-CM | POA: Diagnosis not present

## 2016-09-19 HISTORY — PX: COLONOSCOPY WITH PROPOFOL: SHX5780

## 2016-09-19 SURGERY — COLONOSCOPY WITH PROPOFOL
Anesthesia: General

## 2016-09-19 MED ORDER — MIDAZOLAM HCL 2 MG/2ML IJ SOLN
INTRAMUSCULAR | Status: DC | PRN
  Administered 2016-09-19: 1 mg via INTRAVENOUS

## 2016-09-19 MED ORDER — SODIUM CHLORIDE 0.9 % IV SOLN
INTRAVENOUS | Status: DC
  Administered 2016-09-19: 1000 mL via INTRAVENOUS

## 2016-09-19 MED ORDER — PROPOFOL 500 MG/50ML IV EMUL
INTRAVENOUS | Status: DC | PRN
  Administered 2016-09-19: 120 ug/kg/min via INTRAVENOUS

## 2016-09-19 MED ORDER — FENTANYL CITRATE (PF) 100 MCG/2ML IJ SOLN
INTRAMUSCULAR | Status: DC | PRN
  Administered 2016-09-19: 50 ug via INTRAVENOUS

## 2016-09-19 MED ORDER — EPHEDRINE SULFATE 50 MG/ML IJ SOLN
INTRAMUSCULAR | Status: DC | PRN
  Administered 2016-09-19 (×2): 10 mg via INTRAVENOUS

## 2016-09-19 NOTE — Anesthesia Preprocedure Evaluation (Signed)
Anesthesia Evaluation  Patient identified by MRN, date of birth, ID band Patient awake    Reviewed: Allergy & Precautions, NPO status , Patient's Chart, lab work & pertinent test results  Airway Mallampati: II       Dental  (+) Teeth Intact   Pulmonary neg pulmonary ROS,    breath sounds clear to auscultation       Cardiovascular Exercise Tolerance: Good negative cardio ROS   Rhythm:Regular Rate:Normal     Neuro/Psych Down's Syndrome    GI/Hepatic negative GI ROS, Neg liver ROS,   Endo/Other  negative endocrine ROS  Renal/GU negative Renal ROS     Musculoskeletal negative musculoskeletal ROS (+)   Abdominal Normal abdominal exam  (+)   Peds negative pediatric ROS (+)  Hematology negative hematology ROS (+)   Anesthesia Other Findings   Reproductive/Obstetrics                             Anesthesia Physical Anesthesia Plan  ASA: II  Anesthesia Plan: General   Post-op Pain Management:    Induction: Intravenous  Airway Management Planned: Natural Airway and Nasal Cannula  Additional Equipment:   Intra-op Plan:   Post-operative Plan:   Informed Consent: I have reviewed the patients History and Physical, chart, labs and discussed the procedure including the risks, benefits and alternatives for the proposed anesthesia with the patient or authorized representative who has indicated his/her understanding and acceptance.     Plan Discussed with: CRNA  Anesthesia Plan Comments:         Anesthesia Quick Evaluation

## 2016-09-19 NOTE — Transfer of Care (Signed)
Immediate Anesthesia Transfer of Care Note  Patient: Joseph Flores  Procedure(s) Performed: Procedure(s): COLONOSCOPY WITH PROPOFOL (N/A)  Patient Location: PACU  Anesthesia Type:General  Level of Consciousness: awake and sedated  Airway & Oxygen Therapy: Patient Spontanous Breathing and Patient connected to nasal cannula oxygen  Post-op Assessment: Report given to RN and Post -op Vital signs reviewed and stable  Post vital signs: Reviewed and stable  Last Vitals:  Vitals:   09/19/16 0845  BP: 114/66  Pulse: (!) 103  Resp: 16  Temp: 36.1 C    Last Pain:  Vitals:   09/19/16 0845  TempSrc: Tympanic  PainSc: 3          Complications: No apparent anesthesia complications

## 2016-09-19 NOTE — Op Note (Signed)
[facility] Gastroenterology Patient Name: Joseph Flores Procedure Date: 09/19/2016 9:17 AM MRN: [id number] Account #: [id number] Date of Birth: Aug 03, 1961 Admit Type: Outpatient Age: 55 Room: [facility] ENDO ROOM 3 Gender: Male Note Status: Finalized Procedure:            Colonoscopy Indications:          Diarrhea, Family history of colonic polyps in a                        first-degree relative Providers:            Jonathon Bellows MD, MD Referring MD:         Otila Back. Manuella Ghazi (Referring MD) Medicines:            Monitored Anesthesia Care Complications:        No immediate complications. Procedure:            Pre-Anesthesia Assessment:                       - Prior to the procedure, a History and Physical was                        performed, and patient medications, allergies and                        sensitivities were reviewed. The patient's tolerance of                        previous anesthesia was reviewed.                       - The risks and benefits of the procedure and the                        sedation options and risks were discussed with the                        patient. All questions were answered and informed                        consent was obtained.                       After obtaining informed consent, the colonoscope was                        passed under direct vision. Throughout the procedure,                        the patient's blood pressure, pulse, and oxygen                        saturations were monitored continuously. The                        Colonoscope was introduced through the anus and                        advanced to the the cecum, identified by the  appendiceal orifice, IC valve and transillumination.                        The colonoscopy was performed with ease. The patient                        tolerated the procedure well. The quality of the bowel                        preparation was  excellent. Findings:      The perianal and digital rectal examinations were normal.      A 8 mm polyp was found in the descending colon. The polyp was sessile.       The polyp was removed with a hot snare. Resection and retrieval were       complete.      The entire examined colon appeared normal.      The exam was otherwise without abnormality.      Several biopsies were obtained with cold forceps for evaluation of       microscopic colitis in the entire colon. Impression:           - One 8 mm polyp in the descending colon, removed with                        a hot snare. Resected and retrieved.                       - The entire examined colon is normal.                       - The examination was otherwise normal.                       - Several biopsies were obtained in the entire colon. Recommendation:       - Discharge patient to home (with escort).                       - Patient has a contact number available for                        emergencies. The signs and symptoms of potential                        delayed complications were discussed with the patient.                        Return to normal activities tomorrow. Written discharge                        instructions were provided to the patient.                       - Resume previous diet today.                       - Await pathology results.                       - Repeat colonoscopy in 5 years for surveillance.                       -  Return to referring physician. Procedure Code(s):    --- Professional ---                       [phone], Colonoscopy, flexible; with removal of tumor(s),                        polyp(s), or other lesion(s) by snare technique                       [id number], 45, Colonoscopy, flexible; with biopsy, single                        or multiple Diagnosis Code(s):    --- Professional ---                       Z83.71, Family history of colonic polyps                       R19.7, Diarrhea,  unspecified                       D12.4, Benign neoplasm of descending colon CPT copyright 2016 American Medical Association. All rights reserved. The codes documented in this report are preliminary and upon coder review may  be revised to meet current compliance requirements. Jonathon Bellows, MD Jonathon Bellows MD, MD 09/19/2016 9:46:34 AM This report has been signed electronically. Number of Addenda: 0 Note Initiated On: 09/19/2016 9:17 AM Scope Withdrawal Time: 0 hours 14 minutes 20 seconds  Total Procedure Duration: 0 hours 18 minutes 22 seconds       [facility]

## 2016-09-19 NOTE — Anesthesia Postprocedure Evaluation (Signed)
Anesthesia Post Note  Patient: Joseph Flores  Procedure(s) Performed: Procedure(s) (LRB): COLONOSCOPY WITH PROPOFOL (N/A)  Patient location during evaluation: PACU Anesthesia Type: General Level of consciousness: awake Pain management: pain level controlled Vital Signs Assessment: post-procedure vital signs reviewed and stable Respiratory status: spontaneous breathing Cardiovascular status: stable Anesthetic complications: no    Last Vitals:  Vitals:   09/19/16 0845 09/19/16 0949  BP: 114/66 (!) 86/56  Pulse: (!) 103 84  Resp: 16 16  Temp: 36.1 C 36.1 C    Last Pain:  Vitals:   09/19/16 0949  TempSrc: Tympanic  PainSc: 0-No pain                 VAN STAVEREN,Kieron Kantner

## 2016-09-19 NOTE — Anesthesia Procedure Notes (Signed)
Performed by: COOK-MARTIN, Jamason Peckham Pre-anesthesia Checklist: Patient identified, Emergency Drugs available, Suction available, Patient being monitored and Timeout performed Patient Re-evaluated:Patient Re-evaluated prior to inductionOxygen Delivery Method: Nasal cannula Preoxygenation: Pre-oxygenation with 100% oxygen Intubation Type: IV induction Placement Confirmation: positive ETCO2 and CO2 detector       
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
## 2016-09-20 ENCOUNTER — Encounter: Payer: Self-pay | Admitting: Gastroenterology

## 2016-09-20 DIAGNOSIS — Q909 Down syndrome, unspecified: Secondary | ICD-10-CM | POA: Diagnosis not present

## 2016-09-20 LAB — SURGICAL PATHOLOGY

## 2016-09-21 DIAGNOSIS — Q909 Down syndrome, unspecified: Secondary | ICD-10-CM | POA: Diagnosis not present

## 2016-09-23 DIAGNOSIS — Q909 Down syndrome, unspecified: Secondary | ICD-10-CM | POA: Diagnosis not present

## 2016-09-24 ENCOUNTER — Ambulatory Visit (INDEPENDENT_AMBULATORY_CARE_PROVIDER_SITE_OTHER): Payer: Commercial Managed Care - HMO | Admitting: Family Medicine

## 2016-09-24 ENCOUNTER — Encounter: Payer: Self-pay | Admitting: Family Medicine

## 2016-09-24 VITALS — BP 99/70 | HR 82 | Temp 98.3°F | Resp 17 | Ht 60.0 in | Wt 141.9 lb

## 2016-09-24 DIAGNOSIS — M5136 Other intervertebral disc degeneration, lumbar region: Secondary | ICD-10-CM

## 2016-09-24 DIAGNOSIS — Q909 Down syndrome, unspecified: Secondary | ICD-10-CM | POA: Diagnosis not present

## 2016-09-24 MED ORDER — TRAMADOL HCL 50 MG PO TABS: 50.0000 mg | ORAL_TABLET | Freq: Two times a day (BID) | ORAL | 0 refills | Status: DC | PRN
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
## 2016-09-25 DIAGNOSIS — Q909 Down syndrome, unspecified: Secondary | ICD-10-CM | POA: Diagnosis not present

## 2016-09-26 DIAGNOSIS — Q909 Down syndrome, unspecified: Secondary | ICD-10-CM | POA: Diagnosis not present

## 2016-09-27 DIAGNOSIS — Q909 Down syndrome, unspecified: Secondary | ICD-10-CM | POA: Diagnosis not present

## 2016-09-28 DIAGNOSIS — Q909 Down syndrome, unspecified: Secondary | ICD-10-CM | POA: Diagnosis not present

## 2016-09-30 DIAGNOSIS — Q909 Down syndrome, unspecified: Secondary | ICD-10-CM | POA: Diagnosis not present

## 2016-10-01 DIAGNOSIS — Q909 Down syndrome, unspecified: Secondary | ICD-10-CM | POA: Diagnosis not present

## 2016-10-02 DIAGNOSIS — Q909 Down syndrome, unspecified: Secondary | ICD-10-CM | POA: Diagnosis not present

## 2016-10-03 DIAGNOSIS — Q909 Down syndrome, unspecified: Secondary | ICD-10-CM | POA: Diagnosis not present

## 2016-10-04 DIAGNOSIS — Q909 Down syndrome, unspecified: Secondary | ICD-10-CM | POA: Diagnosis not present

## 2016-10-05 DIAGNOSIS — Q909 Down syndrome, unspecified: Secondary | ICD-10-CM | POA: Diagnosis not present

## 2016-10-07 ENCOUNTER — Ambulatory Visit: Payer: Commercial Managed Care - HMO

## 2016-10-07 ENCOUNTER — Ambulatory Visit: Payer: Commercial Managed Care - HMO | Admitting: Family Medicine

## 2016-10-07 ENCOUNTER — Ambulatory Visit (INDEPENDENT_AMBULATORY_CARE_PROVIDER_SITE_OTHER): Payer: Commercial Managed Care - HMO | Admitting: Family Medicine

## 2016-10-07 VITALS — BP 112/72 | HR 60 | Temp 97.4°F | Ht 60.0 in | Wt 142.5 lb

## 2016-10-07 DIAGNOSIS — Z0000 Encounter for general adult medical examination without abnormal findings: Secondary | ICD-10-CM

## 2016-10-07 DIAGNOSIS — Q909 Down syndrome, unspecified: Secondary | ICD-10-CM | POA: Diagnosis not present

## 2016-10-07 NOTE — Progress Notes (Signed)
This encounter was created in error - please disregard.
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
## 2016-10-07 NOTE — Patient Instructions (Signed)
Joseph Flores , Thank you for taking time to come for your Medicare Wellness Visit. I appreciate your ongoing commitment to your health goals. Please review the following plan we discussed and let me know if I can assist you in the future.   These are the goals we discussed: Goals    . Increase water intake          Starting 10/07/16, I will increase my water intake to 2 glasses a day.       This is a list of the screening recommended for you and due dates:  Health Maintenance  Topic Date Due  .  Hepatitis C: One time screening is recommended by Center for Disease Control  (CDC) for  adults born from [birth year] through 1964.   01-May-1961  . HIV Screening  06/20/1977  . Flu Shot  10/07/2017*  . Tetanus Vaccine  11/11/2020  . Colon Cancer Screening  09/19/2026  *Topic was postponed. The date shown is not the original due date.   Preventive Care for Adults  A healthy lifestyle and preventive care can promote health and wellness. Preventive health guidelines for adults include the following key practices.  . A routine yearly physical is a good way to check with your health care provider about your health and preventive screening. It is a chance to share any concerns and updates on your health and to receive a thorough exam.  . Visit your dentist for a routine exam and preventive care every 6 months. Brush your teeth twice a day and floss once a day. Good oral hygiene prevents tooth decay and gum disease.  . The frequency of eye exams is based on your age, health, family medical history, use  of contact lenses, and other factors. Follow your health care provider's ecommendations for frequency of eye exams.  . Eat a healthy diet. Foods like vegetables, fruits, whole grains, low-fat dairy products, and lean protein foods contain the nutrients you need without too many calories. Decrease your intake of foods high in solid fats, added sugars, and salt. Eat the right amount of calories for you. Get  information about a proper diet from your health care provider, if necessary.  . Regular physical exercise is one of the most important things you can do for your health. Most adults should get at least 150 minutes of moderate-intensity exercise (any activity that increases your heart rate and causes you to sweat) each week. In addition, most adults need muscle-strengthening exercises on 2 or more days a week.  Silver Sneakers may be a benefit available to you. To determine eligibility, you may visit the website: www.silversneakers.com or contact program at 361-862-1169 Mon-Fri between 8AM-8PM.   . Maintain a healthy weight. The body mass index (BMI) is a screening tool to identify possible weight problems. It provides an estimate of body fat based on height and weight. Your health care provider can find your BMI and can help you achieve or maintain a healthy weight.   For adults 20 years and older: ? A BMI below 18.5 is considered underweight. ? A BMI of 18.5 to 24.9 is normal. ? A BMI of 25 to 29.9 is considered overweight. ? A BMI of 30 and above is considered obese.   . Maintain normal blood lipids and cholesterol levels by exercising and minimizing your intake of saturated fat. Eat a balanced diet with plenty of fruit and vegetables. Blood tests for lipids and cholesterol should begin at age 31 and be repeated every  5 years. If your lipid or cholesterol levels are high, you are over 50, or you are at high risk for heart disease, you may need your cholesterol levels checked more frequently. Ongoing high lipid and cholesterol levels should be treated with medicines if diet and exercise are not working.  . If you smoke, find out from your health care provider how to quit. If you do not use tobacco, please do not start.  . If you choose to drink alcohol, please do not consume more than 2 drinks per day. One drink is considered to be 12 ounces (355 mL) of beer, 5 ounces (148 mL) of wine, or 1.5  ounces (44 mL) of liquor.  . If you are 67-75 years old, ask your health care provider if you should take aspirin to prevent strokes.  . Use sunscreen. Apply sunscreen liberally and repeatedly throughout the day. You should seek shade when your shadow is shorter than you. Protect yourself by wearing long sleeves, pants, a wide-brimmed hat, and sunglasses year round, whenever you are outdoors.  . Once a month, do a whole body skin exam, using a mirror to look at the skin on your back. Tell your health care provider of new moles, moles that have irregular borders, moles that are larger than a pencil eraser, or moles that have changed in shape or color.

## 2016-10-08 DIAGNOSIS — Q909 Down syndrome, unspecified: Secondary | ICD-10-CM | POA: Diagnosis not present

## 2016-10-09 DIAGNOSIS — Q909 Down syndrome, unspecified: Secondary | ICD-10-CM | POA: Diagnosis not present

## 2016-10-10 DIAGNOSIS — Q909 Down syndrome, unspecified: Secondary | ICD-10-CM | POA: Diagnosis not present

## 2016-10-11 DIAGNOSIS — Q909 Down syndrome, unspecified: Secondary | ICD-10-CM | POA: Diagnosis not present

## 2016-10-12 DIAGNOSIS — Q909 Down syndrome, unspecified: Secondary | ICD-10-CM | POA: Diagnosis not present

## 2016-10-14 ENCOUNTER — Telehealth: Payer: Self-pay

## 2016-10-14 DIAGNOSIS — Q909 Down syndrome, unspecified: Secondary | ICD-10-CM | POA: Diagnosis not present

## 2016-10-14 NOTE — Telephone Encounter (Signed)
Please review colonoscopy results and advise.

## 2016-10-15 DIAGNOSIS — Q909 Down syndrome, unspecified: Secondary | ICD-10-CM | POA: Diagnosis not present

## 2016-10-16 DIAGNOSIS — Q909 Down syndrome, unspecified: Secondary | ICD-10-CM | POA: Diagnosis not present

## 2016-10-16 NOTE — Telephone Encounter (Signed)
Benign bx-repeat colonoscopy in 10 years

## 2016-10-16 NOTE — Telephone Encounter (Signed)
Tried contacting pt but no vm available to leave message.

## 2016-10-17 DIAGNOSIS — Q909 Down syndrome, unspecified: Secondary | ICD-10-CM | POA: Diagnosis not present

## 2016-10-18 DIAGNOSIS — Q909 Down syndrome, unspecified: Secondary | ICD-10-CM | POA: Diagnosis not present

## 2016-10-18 NOTE — Telephone Encounter (Signed)
Tried contacting pt again, but no vm to leave message.

## 2016-10-19 DIAGNOSIS — Q909 Down syndrome, unspecified: Secondary | ICD-10-CM | POA: Diagnosis not present

## 2016-10-21 DIAGNOSIS — Q909 Down syndrome, unspecified: Secondary | ICD-10-CM | POA: Diagnosis not present

## 2016-10-21 NOTE — Telephone Encounter (Signed)
Unable to reach pt by phone. Mailed letter with results.

## 2016-10-22 DIAGNOSIS — Q909 Down syndrome, unspecified: Secondary | ICD-10-CM | POA: Diagnosis not present

## 2016-10-23 DIAGNOSIS — Q909 Down syndrome, unspecified: Secondary | ICD-10-CM | POA: Diagnosis not present

## 2016-10-24 ENCOUNTER — Emergency Department
Admission: EM | Admit: 2016-10-24 | Discharge: 2016-10-24 | Disposition: A | Discharge status: Home / Self Care | Payer: Commercial Managed Care - HMO | Attending: Emergency Medicine | Admitting: Emergency Medicine

## 2016-10-24 ENCOUNTER — Encounter: Payer: Self-pay | Admitting: Emergency Medicine

## 2016-10-24 ENCOUNTER — Emergency Department: Payer: Commercial Managed Care - HMO

## 2016-10-24 DIAGNOSIS — Z79899 Other long term (current) drug therapy: Secondary | ICD-10-CM | POA: Diagnosis not present

## 2016-10-24 DIAGNOSIS — Y92002 Bathroom of unspecified non-institutional (private) residence single-family (private) house as the place of occurrence of the external cause: Secondary | ICD-10-CM | POA: Diagnosis not present

## 2016-10-24 DIAGNOSIS — W06XXXA Fall from bed, initial encounter: Secondary | ICD-10-CM | POA: Diagnosis not present

## 2016-10-24 DIAGNOSIS — S8991XA Unspecified injury of right lower leg, initial encounter: Secondary | ICD-10-CM | POA: Diagnosis present

## 2016-10-24 DIAGNOSIS — Y939 Activity, unspecified: Secondary | ICD-10-CM | POA: Insufficient documentation

## 2016-10-24 DIAGNOSIS — M25561 Pain in right knee: Secondary | ICD-10-CM | POA: Insufficient documentation

## 2016-10-24 DIAGNOSIS — Y999 Unspecified external cause status: Secondary | ICD-10-CM | POA: Diagnosis not present

## 2016-10-24 DIAGNOSIS — R531 Weakness: Secondary | ICD-10-CM | POA: Diagnosis not present

## 2016-10-24 DIAGNOSIS — Q909 Down syndrome, unspecified: Secondary | ICD-10-CM | POA: Diagnosis not present

## 2016-10-24 DIAGNOSIS — M79604 Pain in right leg: Secondary | ICD-10-CM | POA: Diagnosis not present

## 2016-10-24 LAB — BASIC METABOLIC PANEL
ANION GAP: 7 (ref 5–15)
BUN: 14 mg/dL (ref 6–20)
CALCIUM: 8.8 mg/dL — AB (ref 8.9–10.3)
CO2: 29 mmol/L (ref 22–32)
Chloride: 104 mmol/L (ref 101–111)
Creatinine, Ser: 1.4 mg/dL — ABNORMAL HIGH (ref 0.61–1.24)
GFR, EST NON AFRICAN AMERICAN: 56 mL/min — AB (ref >60)
Glucose, Bld: 99 mg/dL (ref 65–99)
Potassium: 3.5 mmol/L (ref 3.5–5.1)
SODIUM: 140 mmol/L (ref 135–145)

## 2016-10-24 LAB — CBC
HEMATOCRIT: 39.7 % — AB (ref 40.0–52.0)
Hemoglobin: 13.4 g/dL (ref 13.0–18.0)
MCH: 31.7 pg (ref 26.0–34.0)
MCHC: 33.7 g/dL (ref 32.0–36.0)
MCV: 94.2 fL (ref 80.0–100.0)
PLATELETS: 188 10*3/uL (ref 150–440)
RBC: 4.22 MIL/uL — ABNORMAL LOW (ref 4.40–5.90)
RDW: 15 % — AB (ref 11.5–14.5)
WBC: 3.7 10*3/uL — AB (ref 3.8–10.6)

## 2016-10-24 LAB — TROPONIN I
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
## 2016-10-24 NOTE — ED Notes (Signed)
Per family, pt got up to go to the bathroom and was found on the floor; "he looked like he had a seizure because his eyes rolled in back of his head and he was twitching";" he has been sleeping all day"

## 2016-10-24 NOTE — ED Notes (Signed)
Blood draw right ac sent to lab

## 2016-10-24 NOTE — ED Triage Notes (Signed)
Brought in via ems from home  S/p fall  Per pt he fell from bed and then his right knee gave out and he fell again in bathroom  No swelling noted

## 2016-10-25 DIAGNOSIS — Q909 Down syndrome, unspecified: Secondary | ICD-10-CM | POA: Diagnosis not present

## 2016-10-26 DIAGNOSIS — Q909 Down syndrome, unspecified: Secondary | ICD-10-CM | POA: Diagnosis not present

## 2016-10-28 DIAGNOSIS — Q909 Down syndrome, unspecified: Secondary | ICD-10-CM | POA: Diagnosis not present

## 2016-10-29 DIAGNOSIS — Q909 Down syndrome, unspecified: Secondary | ICD-10-CM | POA: Diagnosis not present

## 2016-10-30 DIAGNOSIS — Q909 Down syndrome, unspecified: Secondary | ICD-10-CM | POA: Diagnosis not present

## 2016-10-31 DIAGNOSIS — Q909 Down syndrome, unspecified: Secondary | ICD-10-CM | POA: Diagnosis not present

## 2016-11-01 DIAGNOSIS — Q909 Down syndrome, unspecified: Secondary | ICD-10-CM | POA: Diagnosis not present

## 2016-11-02 DIAGNOSIS — Q909 Down syndrome, unspecified: Secondary | ICD-10-CM | POA: Diagnosis not present

## 2016-11-04 DIAGNOSIS — Q909 Down syndrome, unspecified: Secondary | ICD-10-CM | POA: Diagnosis not present

## 2016-11-05 DIAGNOSIS — Q909 Down syndrome, unspecified: Secondary | ICD-10-CM | POA: Diagnosis not present

## 2016-11-06 ENCOUNTER — Ambulatory Visit (INDEPENDENT_AMBULATORY_CARE_PROVIDER_SITE_OTHER): Payer: Commercial Managed Care - HMO | Admitting: Family Medicine

## 2016-11-06 ENCOUNTER — Encounter: Payer: Self-pay | Admitting: Family Medicine

## 2016-11-06 DIAGNOSIS — N429 Disorder of prostate, unspecified: Secondary | ICD-10-CM | POA: Diagnosis not present

## 2016-11-06 DIAGNOSIS — Z0000 Encounter for general adult medical examination without abnormal findings: Secondary | ICD-10-CM

## 2016-11-06 DIAGNOSIS — Q909 Down syndrome, unspecified: Secondary | ICD-10-CM | POA: Diagnosis not present

## 2016-11-06 DIAGNOSIS — I10 Essential (primary) hypertension: Secondary | ICD-10-CM | POA: Diagnosis not present

## 2016-11-06 DIAGNOSIS — E559 Vitamin D deficiency, unspecified: Secondary | ICD-10-CM | POA: Diagnosis not present

## 2016-11-06 DIAGNOSIS — Z125 Encounter for screening for malignant neoplasm of prostate: Secondary | ICD-10-CM | POA: Diagnosis not present

## 2016-11-06 LAB — COMPLETE METABOLIC PANEL WITH GFR
ALBUMIN: 3.8 g/dL (ref 3.6–5.1)
ALK PHOS: 49 U/L (ref 40–115)
ALT: 11 U/L (ref 9–46)
AST: 17 U/L (ref 10–35)
BUN: 17 mg/dL (ref 7–25)
CALCIUM: 8.6 mg/dL (ref 8.6–10.3)
CO2: 31 mmol/L (ref 20–31)
Chloride: 104 mmol/L (ref 98–110)
Creat: 1.27 mg/dL (ref 0.70–1.33)
GFR, EST NON AFRICAN AMERICAN: 64 mL/min (ref >=60)
GFR, Est African American: 74 mL/min (ref >=60)
Glucose, Bld: 63 mg/dL — ABNORMAL LOW (ref 65–99)
POTASSIUM: 4.7 mmol/L (ref 3.5–5.3)
SODIUM: 142 mmol/L (ref 135–146)
Total Bilirubin: 0.3 mg/dL (ref 0.2–1.2)
Total Protein: 6.7 g/dL (ref 6.1–8.1)

## 2016-11-06 LAB — CBC WITH DIFFERENTIAL/PLATELET
BASOS ABS: 70 {cells}/uL (ref 0–200)
Basophils Relative: 2 %
Eosinophils Absolute: 35 cells/uL (ref 15–500)
Eosinophils Relative: 1 %
HEMATOCRIT: 38.6 % (ref 38.5–50.0)
HEMOGLOBIN: 12.5 g/dL — AB (ref 13.2–17.1)
LYMPHS ABS: 1155 {cells}/uL (ref 850–3900)
LYMPHS PCT: 33 %
MCH: 30.9 pg (ref 27.0–33.0)
MCHC: 32.4 g/dL (ref 32.0–36.0)
MCV: 95.3 fL (ref 80.0–100.0)
MPV: 10.2 fL (ref 7.5–12.5)
Monocytes Absolute: 385 cells/uL (ref 200–950)
Monocytes Relative: 11 %
NEUTROS PCT: 53 %
Neutro Abs: 1855 cells/uL (ref 1500–7800)
Platelets: 259 10*3/uL (ref 140–400)
RBC: 4.05 MIL/uL — ABNORMAL LOW (ref 4.20–5.80)
RDW: 14.4 % (ref 11.0–15.0)
WBC: 3.5 10*3/uL — ABNORMAL LOW (ref 3.8–10.8)

## 2016-11-06 LAB — LIPID PANEL
CHOL/HDL RATIO: 4.4 ratio (ref <5.0)
Cholesterol: 211 mg/dL — ABNORMAL HIGH (ref <200)
HDL: 48 mg/dL (ref >40)
LDL CALC: 151 mg/dL — AB (ref <100)
TRIGLYCERIDES: 60 mg/dL (ref <150)
VLDL: 12 mg/dL (ref <30)

## 2016-11-06 LAB — PSA: PSA: 0.4 ng/mL (ref <=4.0)
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
## 2016-11-07 DIAGNOSIS — Q909 Down syndrome, unspecified: Secondary | ICD-10-CM | POA: Diagnosis not present

## 2016-11-07 LAB — VITAMIN D 25 HYDROXY (VIT D DEFICIENCY, FRACTURES): Vit D, 25-Hydroxy: 35 ng/mL (ref 30–100)

## 2016-11-08 DIAGNOSIS — Q909 Down syndrome, unspecified: Secondary | ICD-10-CM | POA: Diagnosis not present

## 2016-11-09 DIAGNOSIS — Q909 Down syndrome, unspecified: Secondary | ICD-10-CM | POA: Diagnosis not present

## 2016-11-11 DIAGNOSIS — Q909 Down syndrome, unspecified: Secondary | ICD-10-CM | POA: Diagnosis not present

## 2016-11-12 DIAGNOSIS — Q909 Down syndrome, unspecified: Secondary | ICD-10-CM | POA: Diagnosis not present

## 2016-11-13 DIAGNOSIS — Q909 Down syndrome, unspecified: Secondary | ICD-10-CM | POA: Diagnosis not present

## 2016-11-14 DIAGNOSIS — Q909 Down syndrome, unspecified: Secondary | ICD-10-CM | POA: Diagnosis not present

## 2016-11-15 DIAGNOSIS — Q909 Down syndrome, unspecified: Secondary | ICD-10-CM | POA: Diagnosis not present

## 2016-11-16 DIAGNOSIS — Q909 Down syndrome, unspecified: Secondary | ICD-10-CM | POA: Diagnosis not present

## 2016-11-18 DIAGNOSIS — Q909 Down syndrome, unspecified: Secondary | ICD-10-CM | POA: Diagnosis not present

## 2016-11-19 DIAGNOSIS — Q909 Down syndrome, unspecified: Secondary | ICD-10-CM | POA: Diagnosis not present

## 2016-11-20 DIAGNOSIS — Q909 Down syndrome, unspecified: Secondary | ICD-10-CM | POA: Diagnosis not present

## 2016-11-21 DIAGNOSIS — Q909 Down syndrome, unspecified: Secondary | ICD-10-CM | POA: Diagnosis not present

## 2016-11-22 ENCOUNTER — Other Ambulatory Visit: Payer: Self-pay | Admitting: Family Medicine

## 2016-11-22 DIAGNOSIS — Q909 Down syndrome, unspecified: Secondary | ICD-10-CM | POA: Diagnosis not present

## 2016-11-23 DIAGNOSIS — Q909 Down syndrome, unspecified: Secondary | ICD-10-CM | POA: Diagnosis not present

## 2016-11-25 DIAGNOSIS — Q909 Down syndrome, unspecified: Secondary | ICD-10-CM | POA: Diagnosis not present

## 2016-11-26 DIAGNOSIS — Q909 Down syndrome, unspecified: Secondary | ICD-10-CM | POA: Diagnosis not present

## 2016-11-27 DIAGNOSIS — Q909 Down syndrome, unspecified: Secondary | ICD-10-CM | POA: Diagnosis not present

## 2016-11-28 DIAGNOSIS — Q909 Down syndrome, unspecified: Secondary | ICD-10-CM | POA: Diagnosis not present

## 2016-11-29 DIAGNOSIS — Q909 Down syndrome, unspecified: Secondary | ICD-10-CM | POA: Diagnosis not present

## 2016-11-30 DIAGNOSIS — Q909 Down syndrome, unspecified: Secondary | ICD-10-CM | POA: Diagnosis not present

## 2016-12-02 DIAGNOSIS — Q909 Down syndrome, unspecified: Secondary | ICD-10-CM | POA: Diagnosis not present

## 2016-12-03 DIAGNOSIS — Q909 Down syndrome, unspecified: Secondary | ICD-10-CM | POA: Diagnosis not present

## 2016-12-04 DIAGNOSIS — Q909 Down syndrome, unspecified: Secondary | ICD-10-CM | POA: Diagnosis not present

## 2016-12-05 DIAGNOSIS — Q909 Down syndrome, unspecified: Secondary | ICD-10-CM | POA: Diagnosis not present

## 2016-12-06 DIAGNOSIS — Q909 Down syndrome, unspecified: Secondary | ICD-10-CM | POA: Diagnosis not present

## 2016-12-07 DIAGNOSIS — Q909 Down syndrome, unspecified: Secondary | ICD-10-CM | POA: Diagnosis not present

## 2016-12-09 DIAGNOSIS — Q909 Down syndrome, unspecified: Secondary | ICD-10-CM | POA: Diagnosis not present

## 2016-12-10 DIAGNOSIS — Q909 Down syndrome, unspecified: Secondary | ICD-10-CM | POA: Diagnosis not present

## 2016-12-11 DIAGNOSIS — Q909 Down syndrome, unspecified: Secondary | ICD-10-CM | POA: Diagnosis not present

## 2016-12-12 DIAGNOSIS — Q909 Down syndrome, unspecified: Secondary | ICD-10-CM | POA: Diagnosis not present

## 2016-12-13 DIAGNOSIS — Q909 Down syndrome, unspecified: Secondary | ICD-10-CM | POA: Diagnosis not present

## 2016-12-14 DIAGNOSIS — Q909 Down syndrome, unspecified: Secondary | ICD-10-CM | POA: Diagnosis not present

## 2016-12-16 DIAGNOSIS — Q909 Down syndrome, unspecified: Secondary | ICD-10-CM | POA: Diagnosis not present

## 2016-12-17 DIAGNOSIS — Q909 Down syndrome, unspecified: Secondary | ICD-10-CM | POA: Diagnosis not present

## 2016-12-18 DIAGNOSIS — Q909 Down syndrome, unspecified: Secondary | ICD-10-CM | POA: Diagnosis not present

## 2016-12-19 DIAGNOSIS — Q909 Down syndrome, unspecified: Secondary | ICD-10-CM | POA: Diagnosis not present

## 2016-12-20 DIAGNOSIS — Q909 Down syndrome, unspecified: Secondary | ICD-10-CM | POA: Diagnosis not present

## 2016-12-21 DIAGNOSIS — Q909 Down syndrome, unspecified: Secondary | ICD-10-CM | POA: Diagnosis not present

## 2016-12-23 DIAGNOSIS — Q909 Down syndrome, unspecified: Secondary | ICD-10-CM | POA: Diagnosis not present

## 2016-12-24 DIAGNOSIS — Q909 Down syndrome, unspecified: Secondary | ICD-10-CM | POA: Diagnosis not present

## 2016-12-25 DIAGNOSIS — Q909 Down syndrome, unspecified: Secondary | ICD-10-CM | POA: Diagnosis not present

## 2016-12-26 DIAGNOSIS — Q909 Down syndrome, unspecified: Secondary | ICD-10-CM | POA: Diagnosis not present

## 2016-12-27 DIAGNOSIS — Q909 Down syndrome, unspecified: Secondary | ICD-10-CM | POA: Diagnosis not present

## 2016-12-28 DIAGNOSIS — Q909 Down syndrome, unspecified: Secondary | ICD-10-CM | POA: Diagnosis not present

## 2016-12-30 DIAGNOSIS — Q909 Down syndrome, unspecified: Secondary | ICD-10-CM | POA: Diagnosis not present

## 2016-12-31 DIAGNOSIS — Q909 Down syndrome, unspecified: Secondary | ICD-10-CM | POA: Diagnosis not present

## 2017-01-01 DIAGNOSIS — Q909 Down syndrome, unspecified: Secondary | ICD-10-CM | POA: Diagnosis not present

## 2017-01-02 DIAGNOSIS — Q909 Down syndrome, unspecified: Secondary | ICD-10-CM | POA: Diagnosis not present

## 2017-01-03 DIAGNOSIS — Q909 Down syndrome, unspecified: Secondary | ICD-10-CM | POA: Diagnosis not present

## 2017-01-04 DIAGNOSIS — Q909 Down syndrome, unspecified: Secondary | ICD-10-CM | POA: Diagnosis not present

## 2017-01-06 DIAGNOSIS — Q909 Down syndrome, unspecified: Secondary | ICD-10-CM | POA: Diagnosis not present

## 2017-01-07 DIAGNOSIS — Q909 Down syndrome, unspecified: Secondary | ICD-10-CM | POA: Diagnosis not present

## 2017-01-08 DIAGNOSIS — Q909 Down syndrome, unspecified: Secondary | ICD-10-CM | POA: Diagnosis not present

## 2017-01-09 DIAGNOSIS — Q909 Down syndrome, unspecified: Secondary | ICD-10-CM | POA: Diagnosis not present

## 2017-01-10 DIAGNOSIS — Q909 Down syndrome, unspecified: Secondary | ICD-10-CM | POA: Diagnosis not present

## 2017-01-11 DIAGNOSIS — Q909 Down syndrome, unspecified: Secondary | ICD-10-CM | POA: Diagnosis not present

## 2017-01-13 DIAGNOSIS — Q909 Down syndrome, unspecified: Secondary | ICD-10-CM | POA: Diagnosis not present

## 2017-01-14 DIAGNOSIS — Q909 Down syndrome, unspecified: Secondary | ICD-10-CM | POA: Diagnosis not present

## 2017-01-15 DIAGNOSIS — Q909 Down syndrome, unspecified: Secondary | ICD-10-CM | POA: Diagnosis not present

## 2017-01-16 DIAGNOSIS — Q909 Down syndrome, unspecified: Secondary | ICD-10-CM | POA: Diagnosis not present

## 2017-01-17 DIAGNOSIS — Q909 Down syndrome, unspecified: Secondary | ICD-10-CM | POA: Diagnosis not present

## 2017-01-18 DIAGNOSIS — Q909 Down syndrome, unspecified: Secondary | ICD-10-CM | POA: Diagnosis not present

## 2017-01-20 DIAGNOSIS — Q900 Trisomy 21, nonmosaicism (meiotic nondisjunction): Secondary | ICD-10-CM | POA: Diagnosis not present

## 2017-01-21 DIAGNOSIS — Q900 Trisomy 21, nonmosaicism (meiotic nondisjunction): Secondary | ICD-10-CM | POA: Diagnosis not present

## 2017-01-22 DIAGNOSIS — Q900 Trisomy 21, nonmosaicism (meiotic nondisjunction): Secondary | ICD-10-CM | POA: Diagnosis not present

## 2017-01-23 DIAGNOSIS — Q900 Trisomy 21, nonmosaicism (meiotic nondisjunction): Secondary | ICD-10-CM | POA: Diagnosis not present

## 2017-01-24 DIAGNOSIS — Q900 Trisomy 21, nonmosaicism (meiotic nondisjunction): Secondary | ICD-10-CM | POA: Diagnosis not present

## 2017-01-25 DIAGNOSIS — Q900 Trisomy 21, nonmosaicism (meiotic nondisjunction): Secondary | ICD-10-CM | POA: Diagnosis not present

## 2017-01-27 DIAGNOSIS — Q900 Trisomy 21, nonmosaicism (meiotic nondisjunction): Secondary | ICD-10-CM | POA: Diagnosis not present

## 2017-01-28 DIAGNOSIS — Q900 Trisomy 21, nonmosaicism (meiotic nondisjunction): Secondary | ICD-10-CM | POA: Diagnosis not present

## 2017-01-29 DIAGNOSIS — Q900 Trisomy 21, nonmosaicism (meiotic nondisjunction): Secondary | ICD-10-CM | POA: Diagnosis not present

## 2017-01-30 DIAGNOSIS — Q900 Trisomy 21, nonmosaicism (meiotic nondisjunction): Secondary | ICD-10-CM | POA: Diagnosis not present

## 2017-01-31 DIAGNOSIS — Q900 Trisomy 21, nonmosaicism (meiotic nondisjunction): Secondary | ICD-10-CM | POA: Diagnosis not present

## 2017-02-01 DIAGNOSIS — Q900 Trisomy 21, nonmosaicism (meiotic nondisjunction): Secondary | ICD-10-CM | POA: Diagnosis not present

## 2017-02-03 DIAGNOSIS — Q909 Down syndrome, unspecified: Secondary | ICD-10-CM | POA: Diagnosis not present

## 2017-02-04 DIAGNOSIS — Q909 Down syndrome, unspecified: Secondary | ICD-10-CM | POA: Diagnosis not present

## 2017-02-05 DIAGNOSIS — Q909 Down syndrome, unspecified: Secondary | ICD-10-CM | POA: Diagnosis not present

## 2017-02-06 DIAGNOSIS — Q909 Down syndrome, unspecified: Secondary | ICD-10-CM | POA: Diagnosis not present

## 2017-02-07 DIAGNOSIS — Q909 Down syndrome, unspecified: Secondary | ICD-10-CM | POA: Diagnosis not present

## 2017-02-08 DIAGNOSIS — Q909 Down syndrome, unspecified: Secondary | ICD-10-CM | POA: Diagnosis not present

## 2017-02-10 DIAGNOSIS — Q900 Trisomy 21, nonmosaicism (meiotic nondisjunction): Secondary | ICD-10-CM | POA: Diagnosis not present

## 2017-02-11 DIAGNOSIS — Q900 Trisomy 21, nonmosaicism (meiotic nondisjunction): Secondary | ICD-10-CM | POA: Diagnosis not present

## 2017-02-12 DIAGNOSIS — Q900 Trisomy 21, nonmosaicism (meiotic nondisjunction): Secondary | ICD-10-CM | POA: Diagnosis not present

## 2017-02-13 DIAGNOSIS — Q900 Trisomy 21, nonmosaicism (meiotic nondisjunction): Secondary | ICD-10-CM | POA: Diagnosis not present

## 2017-02-14 DIAGNOSIS — Q900 Trisomy 21, nonmosaicism (meiotic nondisjunction): Secondary | ICD-10-CM | POA: Diagnosis not present

## 2017-02-15 DIAGNOSIS — Q900 Trisomy 21, nonmosaicism (meiotic nondisjunction): Secondary | ICD-10-CM | POA: Diagnosis not present

## 2017-02-17 DIAGNOSIS — Q909 Down syndrome, unspecified: Secondary | ICD-10-CM | POA: Diagnosis not present

## 2017-02-18 DIAGNOSIS — Q909 Down syndrome, unspecified: Secondary | ICD-10-CM | POA: Diagnosis not present

## 2017-02-19 DIAGNOSIS — Q909 Down syndrome, unspecified: Secondary | ICD-10-CM | POA: Diagnosis not present

## 2017-02-20 DIAGNOSIS — Q909 Down syndrome, unspecified: Secondary | ICD-10-CM | POA: Diagnosis not present

## 2017-02-21 DIAGNOSIS — Q909 Down syndrome, unspecified: Secondary | ICD-10-CM | POA: Diagnosis not present

## 2017-02-22 DIAGNOSIS — Q909 Down syndrome, unspecified: Secondary | ICD-10-CM | POA: Diagnosis not present

## 2017-02-24 DIAGNOSIS — Q909 Down syndrome, unspecified: Secondary | ICD-10-CM | POA: Diagnosis not present

## 2017-02-25 DIAGNOSIS — Q909 Down syndrome, unspecified: Secondary | ICD-10-CM | POA: Diagnosis not present

## 2017-02-26 DIAGNOSIS — Q909 Down syndrome, unspecified: Secondary | ICD-10-CM | POA: Diagnosis not present

## 2017-02-27 DIAGNOSIS — Q909 Down syndrome, unspecified: Secondary | ICD-10-CM | POA: Diagnosis not present

## 2017-02-28 DIAGNOSIS — Q909 Down syndrome, unspecified: Secondary | ICD-10-CM | POA: Diagnosis not present

## 2017-03-01 DIAGNOSIS — Q909 Down syndrome, unspecified: Secondary | ICD-10-CM | POA: Diagnosis not present

## 2017-03-03 DIAGNOSIS — Q909 Down syndrome, unspecified: Secondary | ICD-10-CM | POA: Diagnosis not present

## 2017-03-04 DIAGNOSIS — Q909 Down syndrome, unspecified: Secondary | ICD-10-CM | POA: Diagnosis not present

## 2017-03-05 DIAGNOSIS — Q909 Down syndrome, unspecified: Secondary | ICD-10-CM | POA: Diagnosis not present

## 2017-03-06 DIAGNOSIS — Q909 Down syndrome, unspecified: Secondary | ICD-10-CM | POA: Diagnosis not present

## 2017-03-07 DIAGNOSIS — Q909 Down syndrome, unspecified: Secondary | ICD-10-CM | POA: Diagnosis not present

## 2017-03-08 DIAGNOSIS — Q909 Down syndrome, unspecified: Secondary | ICD-10-CM | POA: Diagnosis not present

## 2017-03-10 DIAGNOSIS — Q909 Down syndrome, unspecified: Secondary | ICD-10-CM | POA: Diagnosis not present

## 2017-03-11 DIAGNOSIS — Q909 Down syndrome, unspecified: Secondary | ICD-10-CM | POA: Diagnosis not present

## 2017-03-12 DIAGNOSIS — Q909 Down syndrome, unspecified: Secondary | ICD-10-CM | POA: Diagnosis not present

## 2017-03-13 DIAGNOSIS — Q909 Down syndrome, unspecified: Secondary | ICD-10-CM | POA: Diagnosis not present

## 2017-03-14 DIAGNOSIS — Q909 Down syndrome, unspecified: Secondary | ICD-10-CM | POA: Diagnosis not present

## 2017-03-15 DIAGNOSIS — Q909 Down syndrome, unspecified: Secondary | ICD-10-CM | POA: Diagnosis not present

## 2017-03-17 DIAGNOSIS — Q909 Down syndrome, unspecified: Secondary | ICD-10-CM | POA: Diagnosis not present

## 2017-03-18 DIAGNOSIS — Q909 Down syndrome, unspecified: Secondary | ICD-10-CM | POA: Diagnosis not present

## 2017-03-19 DIAGNOSIS — Q909 Down syndrome, unspecified: Secondary | ICD-10-CM | POA: Diagnosis not present

## 2017-03-20 DIAGNOSIS — Q909 Down syndrome, unspecified: Secondary | ICD-10-CM | POA: Diagnosis not present

## 2017-03-21 DIAGNOSIS — Q909 Down syndrome, unspecified: Secondary | ICD-10-CM | POA: Diagnosis not present

## 2017-03-22 DIAGNOSIS — Q909 Down syndrome, unspecified: Secondary | ICD-10-CM | POA: Diagnosis not present

## 2017-03-24 DIAGNOSIS — Q909 Down syndrome, unspecified: Secondary | ICD-10-CM | POA: Diagnosis not present

## 2017-03-25 DIAGNOSIS — Q909 Down syndrome, unspecified: Secondary | ICD-10-CM | POA: Diagnosis not present

## 2017-03-26 DIAGNOSIS — Q909 Down syndrome, unspecified: Secondary | ICD-10-CM | POA: Diagnosis not present

## 2017-03-27 DIAGNOSIS — Q909 Down syndrome, unspecified: Secondary | ICD-10-CM | POA: Diagnosis not present

## 2017-03-28 DIAGNOSIS — Q909 Down syndrome, unspecified: Secondary | ICD-10-CM | POA: Diagnosis not present

## 2017-03-29 DIAGNOSIS — Q909 Down syndrome, unspecified: Secondary | ICD-10-CM | POA: Diagnosis not present

## 2017-03-31 DIAGNOSIS — Q909 Down syndrome, unspecified: Secondary | ICD-10-CM | POA: Diagnosis not present

## 2017-04-01 DIAGNOSIS — Q909 Down syndrome, unspecified: Secondary | ICD-10-CM | POA: Diagnosis not present

## 2017-04-02 DIAGNOSIS — Q909 Down syndrome, unspecified: Secondary | ICD-10-CM | POA: Diagnosis not present

## 2017-04-03 DIAGNOSIS — Q909 Down syndrome, unspecified: Secondary | ICD-10-CM | POA: Diagnosis not present

## 2017-04-04 DIAGNOSIS — Q909 Down syndrome, unspecified: Secondary | ICD-10-CM | POA: Diagnosis not present

## 2017-04-05 DIAGNOSIS — Q909 Down syndrome, unspecified: Secondary | ICD-10-CM | POA: Diagnosis not present

## 2017-04-07 DIAGNOSIS — Q909 Down syndrome, unspecified: Secondary | ICD-10-CM | POA: Diagnosis not present

## 2017-04-08 DIAGNOSIS — Q909 Down syndrome, unspecified: Secondary | ICD-10-CM | POA: Diagnosis not present

## 2017-04-09 DIAGNOSIS — Q909 Down syndrome, unspecified: Secondary | ICD-10-CM | POA: Diagnosis not present

## 2017-04-10 DIAGNOSIS — Q909 Down syndrome, unspecified: Secondary | ICD-10-CM | POA: Diagnosis not present

## 2017-04-11 DIAGNOSIS — Q909 Down syndrome, unspecified: Secondary | ICD-10-CM | POA: Diagnosis not present

## 2017-04-12 DIAGNOSIS — Q909 Down syndrome, unspecified: Secondary | ICD-10-CM | POA: Diagnosis not present

## 2017-04-14 DIAGNOSIS — Q900 Trisomy 21, nonmosaicism (meiotic nondisjunction): Secondary | ICD-10-CM | POA: Diagnosis not present

## 2017-04-15 DIAGNOSIS — Q900 Trisomy 21, nonmosaicism (meiotic nondisjunction): Secondary | ICD-10-CM | POA: Diagnosis not present

## 2017-04-16 DIAGNOSIS — Q900 Trisomy 21, nonmosaicism (meiotic nondisjunction): Secondary | ICD-10-CM | POA: Diagnosis not present

## 2017-04-17 DIAGNOSIS — Q900 Trisomy 21, nonmosaicism (meiotic nondisjunction): Secondary | ICD-10-CM | POA: Diagnosis not present

## 2017-04-18 DIAGNOSIS — Q900 Trisomy 21, nonmosaicism (meiotic nondisjunction): Secondary | ICD-10-CM | POA: Diagnosis not present

## 2017-04-19 DIAGNOSIS — Q900 Trisomy 21, nonmosaicism (meiotic nondisjunction): Secondary | ICD-10-CM | POA: Diagnosis not present

## 2017-04-21 DIAGNOSIS — Q900 Trisomy 21, nonmosaicism (meiotic nondisjunction): Secondary | ICD-10-CM | POA: Diagnosis not present

## 2017-04-22 DIAGNOSIS — Q900 Trisomy 21, nonmosaicism (meiotic nondisjunction): Secondary | ICD-10-CM | POA: Diagnosis not present

## 2017-04-23 DIAGNOSIS — Q900 Trisomy 21, nonmosaicism (meiotic nondisjunction): Secondary | ICD-10-CM | POA: Diagnosis not present

## 2017-04-24 DIAGNOSIS — Q900 Trisomy 21, nonmosaicism (meiotic nondisjunction): Secondary | ICD-10-CM | POA: Diagnosis not present

## 2017-04-25 DIAGNOSIS — Q900 Trisomy 21, nonmosaicism (meiotic nondisjunction): Secondary | ICD-10-CM | POA: Diagnosis not present

## 2017-04-26 DIAGNOSIS — Q900 Trisomy 21, nonmosaicism (meiotic nondisjunction): Secondary | ICD-10-CM | POA: Diagnosis not present

## 2017-04-28 DIAGNOSIS — Q909 Down syndrome, unspecified: Secondary | ICD-10-CM | POA: Diagnosis not present

## 2017-04-29 DIAGNOSIS — Q909 Down syndrome, unspecified: Secondary | ICD-10-CM | POA: Diagnosis not present

## 2017-04-30 DIAGNOSIS — Q909 Down syndrome, unspecified: Secondary | ICD-10-CM | POA: Diagnosis not present

## 2017-05-01 DIAGNOSIS — Q909 Down syndrome, unspecified: Secondary | ICD-10-CM | POA: Diagnosis not present

## 2017-05-02 DIAGNOSIS — Q909 Down syndrome, unspecified: Secondary | ICD-10-CM | POA: Diagnosis not present

## 2017-05-03 DIAGNOSIS — Q909 Down syndrome, unspecified: Secondary | ICD-10-CM | POA: Diagnosis not present

## 2017-05-05 DIAGNOSIS — Q909 Down syndrome, unspecified: Secondary | ICD-10-CM | POA: Diagnosis not present

## 2017-05-06 DIAGNOSIS — Q909 Down syndrome, unspecified: Secondary | ICD-10-CM | POA: Diagnosis not present

## 2017-05-07 DIAGNOSIS — Q909 Down syndrome, unspecified: Secondary | ICD-10-CM | POA: Diagnosis not present

## 2017-05-08 DIAGNOSIS — Q909 Down syndrome, unspecified: Secondary | ICD-10-CM | POA: Diagnosis not present

## 2017-05-09 DIAGNOSIS — Q909 Down syndrome, unspecified: Secondary | ICD-10-CM | POA: Diagnosis not present

## 2017-05-12 DIAGNOSIS — Q909 Down syndrome, unspecified: Secondary | ICD-10-CM | POA: Diagnosis not present

## 2017-05-13 DIAGNOSIS — Q909 Down syndrome, unspecified: Secondary | ICD-10-CM | POA: Diagnosis not present

## 2017-05-14 DIAGNOSIS — Q909 Down syndrome, unspecified: Secondary | ICD-10-CM | POA: Diagnosis not present

## 2017-05-15 DIAGNOSIS — Q909 Down syndrome, unspecified: Secondary | ICD-10-CM | POA: Diagnosis not present

## 2017-05-16 DIAGNOSIS — Q909 Down syndrome, unspecified: Secondary | ICD-10-CM | POA: Diagnosis not present

## 2017-05-17 DIAGNOSIS — Q909 Down syndrome, unspecified: Secondary | ICD-10-CM | POA: Diagnosis not present

## 2017-05-19 DIAGNOSIS — Q909 Down syndrome, unspecified: Secondary | ICD-10-CM | POA: Diagnosis not present

## 2017-05-20 DIAGNOSIS — Q909 Down syndrome, unspecified: Secondary | ICD-10-CM | POA: Diagnosis not present

## 2017-05-21 ENCOUNTER — Ambulatory Visit (INDEPENDENT_AMBULATORY_CARE_PROVIDER_SITE_OTHER): Payer: Medicare HMO | Admitting: Family Medicine

## 2017-05-21 ENCOUNTER — Encounter: Payer: Self-pay | Admitting: Family Medicine

## 2017-05-21 DIAGNOSIS — Q159 Congenital malformation of eye, unspecified: Secondary | ICD-10-CM | POA: Diagnosis not present

## 2017-05-21 DIAGNOSIS — Q909 Down syndrome, unspecified: Secondary | ICD-10-CM | POA: Diagnosis not present

## 2017-05-21 DIAGNOSIS — R0683 Snoring: Secondary | ICD-10-CM | POA: Diagnosis not present

## 2017-05-21 DIAGNOSIS — G4710 Hypersomnia, unspecified: Secondary | ICD-10-CM | POA: Diagnosis not present

## 2017-05-21 DIAGNOSIS — D649 Anemia, unspecified: Secondary | ICD-10-CM | POA: Diagnosis not present

## 2017-05-21 DIAGNOSIS — E559 Vitamin D deficiency, unspecified: Secondary | ICD-10-CM | POA: Diagnosis not present

## 2017-05-21 DIAGNOSIS — R5383 Other fatigue: Secondary | ICD-10-CM | POA: Insufficient documentation

## 2017-05-21 LAB — CBC WITH DIFFERENTIAL/PLATELET
BASOS ABS: 86 {cells}/uL (ref 0–200)
Basophils Relative: 2 %
EOS ABS: 43 {cells}/uL (ref 15–500)
Eosinophils Relative: 1 %
HCT: 39.1 % (ref 38.5–50.0)
Hemoglobin: 12.5 g/dL — ABNORMAL LOW (ref 13.2–17.1)
LYMPHS PCT: 25 %
Lymphs Abs: 1075 cells/uL (ref 850–3900)
MCH: 31.1 pg (ref 27.0–33.0)
MCHC: 32 g/dL (ref 32.0–36.0)
MCV: 97.3 fL (ref 80.0–100.0)
MPV: 10.3 fL (ref 7.5–12.5)
Monocytes Absolute: 602 cells/uL (ref 200–950)
Monocytes Relative: 14 %
Neutro Abs: 2494 cells/uL (ref 1500–7800)
Neutrophils Relative %: 58 %
PLATELETS: 240 10*3/uL (ref 140–400)
RBC: 4.02 MIL/uL — ABNORMAL LOW (ref 4.20–5.80)
RDW: 14.2 % (ref 11.0–15.0)
WBC: 4.3 10*3/uL (ref 3.8–10.8)

## 2017-05-21 LAB — TSH: TSH: 1.6 mIU/L (ref 0.40–4.50)
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
## 2017-05-22 DIAGNOSIS — Q909 Down syndrome, unspecified: Secondary | ICD-10-CM | POA: Diagnosis not present

## 2017-05-22 LAB — COMPLETE METABOLIC PANEL WITH GFR
ALT: 8 U/L — ABNORMAL LOW (ref 9–46)
AST: 15 U/L (ref 10–35)
Albumin: 3.7 g/dL (ref 3.6–5.1)
Alkaline Phosphatase: 60 U/L (ref 40–115)
BUN: 12 mg/dL (ref 7–25)
CALCIUM: 8.6 mg/dL (ref 8.6–10.3)
CHLORIDE: 105 mmol/L (ref 98–110)
CO2: 26 mmol/L (ref 20–31)
Creat: 1.57 mg/dL — ABNORMAL HIGH (ref 0.70–1.33)
GFR, EST AFRICAN AMERICAN: 57 mL/min — AB (ref >=60)
GFR, Est Non African American: 49 mL/min — ABNORMAL LOW (ref >=60)
Glucose, Bld: 76 mg/dL (ref 65–99)
POTASSIUM: 4.1 mmol/L (ref 3.5–5.3)
Sodium: 142 mmol/L (ref 135–146)
Total Bilirubin: 0.3 mg/dL (ref 0.2–1.2)
Total Protein: 6.4 g/dL (ref 6.1–8.1)

## 2017-05-22 LAB — VITAMIN D 25 HYDROXY (VIT D DEFICIENCY, FRACTURES): VIT D 25 HYDROXY: 12 ng/mL — AB (ref 30–100)

## 2017-05-22 LAB — VITAMIN B12: VITAMIN B 12: 273 pg/mL (ref 200–1100)

## 2017-05-23 ENCOUNTER — Telehealth: Payer: Self-pay

## 2017-05-23 DIAGNOSIS — Q909 Down syndrome, unspecified: Secondary | ICD-10-CM | POA: Diagnosis not present

## 2017-05-23 MED ORDER — VITAMIN D (ERGOCALCIFEROL) 1.25 MG (50000 UNIT) PO CAPS: 50000.0000 [IU] | ORAL_CAPSULE | ORAL | 0 refills | Status: AC

## 2017-05-23 NOTE — Telephone Encounter (Signed)
Patient has been notified of lab results and a prescription for vitamin D3 50,000 units take 1 capsule once a week x12 weeks has been sent to [facility] per Dr. Manuella Ghazi, patient has been notified

## 2017-05-24 DIAGNOSIS — Q909 Down syndrome, unspecified: Secondary | ICD-10-CM | POA: Diagnosis not present

## 2017-05-26 DIAGNOSIS — Q909 Down syndrome, unspecified: Secondary | ICD-10-CM | POA: Diagnosis not present

## 2017-05-27 DIAGNOSIS — Q909 Down syndrome, unspecified: Secondary | ICD-10-CM | POA: Diagnosis not present

## 2017-05-27 DIAGNOSIS — H11002 Unspecified pterygium of left eye: Secondary | ICD-10-CM | POA: Diagnosis not present

## 2017-05-28 DIAGNOSIS — Q909 Down syndrome, unspecified: Secondary | ICD-10-CM | POA: Diagnosis not present

## 2017-05-29 DIAGNOSIS — Q909 Down syndrome, unspecified: Secondary | ICD-10-CM | POA: Diagnosis not present

## 2017-05-30 DIAGNOSIS — Q909 Down syndrome, unspecified: Secondary | ICD-10-CM | POA: Diagnosis not present

## 2017-05-31 DIAGNOSIS — Q909 Down syndrome, unspecified: Secondary | ICD-10-CM | POA: Diagnosis not present

## 2017-06-02 DIAGNOSIS — Q909 Down syndrome, unspecified: Secondary | ICD-10-CM | POA: Diagnosis not present

## 2017-06-03 DIAGNOSIS — Q909 Down syndrome, unspecified: Secondary | ICD-10-CM | POA: Diagnosis not present

## 2017-06-04 DIAGNOSIS — Q909 Down syndrome, unspecified: Secondary | ICD-10-CM | POA: Diagnosis not present

## 2017-06-05 DIAGNOSIS — Q909 Down syndrome, unspecified: Secondary | ICD-10-CM | POA: Diagnosis not present

## 2017-06-06 DIAGNOSIS — Q909 Down syndrome, unspecified: Secondary | ICD-10-CM | POA: Diagnosis not present

## 2017-06-07 DIAGNOSIS — Q909 Down syndrome, unspecified: Secondary | ICD-10-CM | POA: Diagnosis not present

## 2017-06-09 DIAGNOSIS — Q909 Down syndrome, unspecified: Secondary | ICD-10-CM | POA: Diagnosis not present

## 2017-06-10 DIAGNOSIS — Q909 Down syndrome, unspecified: Secondary | ICD-10-CM | POA: Diagnosis not present

## 2017-06-11 DIAGNOSIS — Q909 Down syndrome, unspecified: Secondary | ICD-10-CM | POA: Diagnosis not present

## 2017-06-12 DIAGNOSIS — Q909 Down syndrome, unspecified: Secondary | ICD-10-CM | POA: Diagnosis not present

## 2017-06-13 DIAGNOSIS — Q909 Down syndrome, unspecified: Secondary | ICD-10-CM | POA: Diagnosis not present

## 2017-06-14 DIAGNOSIS — Q909 Down syndrome, unspecified: Secondary | ICD-10-CM | POA: Diagnosis not present

## 2017-06-16 DIAGNOSIS — Q909 Down syndrome, unspecified: Secondary | ICD-10-CM | POA: Diagnosis not present

## 2017-06-17 DIAGNOSIS — Q909 Down syndrome, unspecified: Secondary | ICD-10-CM | POA: Diagnosis not present

## 2017-06-18 DIAGNOSIS — Q909 Down syndrome, unspecified: Secondary | ICD-10-CM | POA: Diagnosis not present

## 2017-06-19 DIAGNOSIS — Q909 Down syndrome, unspecified: Secondary | ICD-10-CM | POA: Diagnosis not present

## 2017-06-20 DIAGNOSIS — Q909 Down syndrome, unspecified: Secondary | ICD-10-CM | POA: Diagnosis not present

## 2017-06-21 DIAGNOSIS — Q909 Down syndrome, unspecified: Secondary | ICD-10-CM | POA: Diagnosis not present

## 2017-06-23 DIAGNOSIS — Q909 Down syndrome, unspecified: Secondary | ICD-10-CM | POA: Diagnosis not present

## 2017-06-24 DIAGNOSIS — Q909 Down syndrome, unspecified: Secondary | ICD-10-CM | POA: Diagnosis not present

## 2017-06-25 DIAGNOSIS — Q909 Down syndrome, unspecified: Secondary | ICD-10-CM | POA: Diagnosis not present

## 2017-06-26 DIAGNOSIS — Q909 Down syndrome, unspecified: Secondary | ICD-10-CM | POA: Diagnosis not present

## 2017-06-27 DIAGNOSIS — Q909 Down syndrome, unspecified: Secondary | ICD-10-CM | POA: Diagnosis not present

## 2017-06-28 DIAGNOSIS — Q909 Down syndrome, unspecified: Secondary | ICD-10-CM | POA: Diagnosis not present

## 2017-06-30 DIAGNOSIS — Q909 Down syndrome, unspecified: Secondary | ICD-10-CM | POA: Diagnosis not present

## 2017-07-01 DIAGNOSIS — Q909 Down syndrome, unspecified: Secondary | ICD-10-CM | POA: Diagnosis not present

## 2017-07-02 DIAGNOSIS — Q909 Down syndrome, unspecified: Secondary | ICD-10-CM | POA: Diagnosis not present

## 2017-07-03 DIAGNOSIS — Q909 Down syndrome, unspecified: Secondary | ICD-10-CM | POA: Diagnosis not present

## 2017-07-04 DIAGNOSIS — Q909 Down syndrome, unspecified: Secondary | ICD-10-CM | POA: Diagnosis not present

## 2017-07-07 DIAGNOSIS — Q909 Down syndrome, unspecified: Secondary | ICD-10-CM | POA: Diagnosis not present

## 2017-07-08 DIAGNOSIS — Q909 Down syndrome, unspecified: Secondary | ICD-10-CM | POA: Diagnosis not present

## 2017-07-09 DIAGNOSIS — Z9181 History of falling: Secondary | ICD-10-CM | POA: Diagnosis not present

## 2017-07-09 DIAGNOSIS — R4689 Other symptoms and signs involving appearance and behavior: Secondary | ICD-10-CM | POA: Diagnosis not present

## 2017-07-09 DIAGNOSIS — R5383 Other fatigue: Secondary | ICD-10-CM | POA: Diagnosis not present

## 2017-07-09 DIAGNOSIS — E785 Hyperlipidemia, unspecified: Secondary | ICD-10-CM | POA: Diagnosis not present

## 2017-07-09 DIAGNOSIS — Q909 Down syndrome, unspecified: Secondary | ICD-10-CM | POA: Diagnosis not present

## 2017-07-09 DIAGNOSIS — R4189 Other symptoms and signs involving cognitive functions and awareness: Secondary | ICD-10-CM | POA: Diagnosis not present

## 2017-07-09 DIAGNOSIS — Z1211 Encounter for screening for malignant neoplasm of colon: Secondary | ICD-10-CM | POA: Diagnosis not present

## 2017-07-10 DIAGNOSIS — Q909 Down syndrome, unspecified: Secondary | ICD-10-CM | POA: Diagnosis not present

## 2017-07-11 DIAGNOSIS — Q909 Down syndrome, unspecified: Secondary | ICD-10-CM | POA: Diagnosis not present

## 2017-07-12 DIAGNOSIS — Q909 Down syndrome, unspecified: Secondary | ICD-10-CM | POA: Diagnosis not present

## 2017-07-14 DIAGNOSIS — Q909 Down syndrome, unspecified: Secondary | ICD-10-CM | POA: Diagnosis not present

## 2017-07-15 DIAGNOSIS — Q909 Down syndrome, unspecified: Secondary | ICD-10-CM | POA: Diagnosis not present

## 2017-07-16 DIAGNOSIS — Q909 Down syndrome, unspecified: Secondary | ICD-10-CM | POA: Diagnosis not present

## 2017-07-16 DIAGNOSIS — R4182 Altered mental status, unspecified: Secondary | ICD-10-CM | POA: Diagnosis not present

## 2017-07-17 DIAGNOSIS — Q909 Down syndrome, unspecified: Secondary | ICD-10-CM | POA: Diagnosis not present

## 2017-07-18 DIAGNOSIS — Q909 Down syndrome, unspecified: Secondary | ICD-10-CM | POA: Diagnosis not present

## 2017-07-19 DIAGNOSIS — Q909 Down syndrome, unspecified: Secondary | ICD-10-CM | POA: Diagnosis not present

## 2017-07-21 DIAGNOSIS — Q909 Down syndrome, unspecified: Secondary | ICD-10-CM | POA: Diagnosis not present

## 2017-07-22 DIAGNOSIS — Q909 Down syndrome, unspecified: Secondary | ICD-10-CM | POA: Diagnosis not present

## 2017-07-23 DIAGNOSIS — Q909 Down syndrome, unspecified: Secondary | ICD-10-CM | POA: Diagnosis not present

## 2017-07-24 DIAGNOSIS — Q909 Down syndrome, unspecified: Secondary | ICD-10-CM | POA: Diagnosis not present

## 2017-07-25 DIAGNOSIS — Q909 Down syndrome, unspecified: Secondary | ICD-10-CM | POA: Diagnosis not present

## 2017-07-26 DIAGNOSIS — Q909 Down syndrome, unspecified: Secondary | ICD-10-CM | POA: Diagnosis not present

## 2017-07-27 DIAGNOSIS — Q909 Down syndrome, unspecified: Secondary | ICD-10-CM | POA: Diagnosis not present

## 2017-07-29 DIAGNOSIS — Q909 Down syndrome, unspecified: Secondary | ICD-10-CM | POA: Diagnosis not present

## 2017-07-30 ENCOUNTER — Emergency Department
Admission: EM | Admit: 2017-07-30 | Discharge: 2017-07-30 | Disposition: A | Discharge status: Home / Self Care | Payer: Medicare HMO | Attending: Emergency Medicine | Admitting: Emergency Medicine

## 2017-07-30 DIAGNOSIS — M545 Low back pain: Secondary | ICD-10-CM | POA: Insufficient documentation

## 2017-07-30 DIAGNOSIS — M549 Dorsalgia, unspecified: Secondary | ICD-10-CM

## 2017-07-30 DIAGNOSIS — W19XXXA Unspecified fall, initial encounter: Secondary | ICD-10-CM

## 2017-07-30 DIAGNOSIS — Q909 Down syndrome, unspecified: Secondary | ICD-10-CM | POA: Diagnosis not present
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
## 2017-07-30 NOTE — ED Notes (Signed)
Caregiver , pt sister verbalizes d/c teaching and follow up. Pt in NAD at time of d/c. Pt in wc to lobby with family

## 2017-07-30 NOTE — ED Triage Notes (Signed)
Pt comes into the ED via EMS , caregiver has arrived and states the pt has a habit of wandering off from the home where he lives with his sister and has an unsteady gait and tends to fall. States when she arrived to the home today EMS was already there and the pt c/o back pain. States he has chronic back pain with arthritis.

## 2017-07-31 DIAGNOSIS — Q909 Down syndrome, unspecified: Secondary | ICD-10-CM | POA: Diagnosis not present

## 2017-08-01 DIAGNOSIS — Q909 Down syndrome, unspecified: Secondary | ICD-10-CM | POA: Diagnosis not present

## 2017-08-02 DIAGNOSIS — Q909 Down syndrome, unspecified: Secondary | ICD-10-CM | POA: Diagnosis not present

## 2017-08-03 DIAGNOSIS — Q909 Down syndrome, unspecified: Secondary | ICD-10-CM | POA: Diagnosis not present

## 2017-08-04 DIAGNOSIS — Q909 Down syndrome, unspecified: Secondary | ICD-10-CM | POA: Diagnosis not present

## 2017-08-05 DIAGNOSIS — Q909 Down syndrome, unspecified: Secondary | ICD-10-CM | POA: Diagnosis not present

## 2017-08-06 DIAGNOSIS — Q909 Down syndrome, unspecified: Secondary | ICD-10-CM | POA: Diagnosis not present

## 2017-08-07 DIAGNOSIS — Q909 Down syndrome, unspecified: Secondary | ICD-10-CM | POA: Diagnosis not present

## 2017-08-08 DIAGNOSIS — Q909 Down syndrome, unspecified: Secondary | ICD-10-CM | POA: Diagnosis not present

## 2017-08-11 DIAGNOSIS — Z9181 History of falling: Secondary | ICD-10-CM | POA: Diagnosis not present

## 2017-08-11 DIAGNOSIS — R29818 Other symptoms and signs involving the nervous system: Secondary | ICD-10-CM | POA: Diagnosis not present

## 2017-08-11 DIAGNOSIS — R5383 Other fatigue: Secondary | ICD-10-CM | POA: Diagnosis not present

## 2017-08-11 DIAGNOSIS — D649 Anemia, unspecified: Secondary | ICD-10-CM | POA: Diagnosis not present

## 2017-08-11 DIAGNOSIS — Q909 Down syndrome, unspecified: Secondary | ICD-10-CM | POA: Diagnosis not present

## 2017-08-12 DIAGNOSIS — Q909 Down syndrome, unspecified: Secondary | ICD-10-CM | POA: Diagnosis not present

## 2017-08-13 DIAGNOSIS — Q909 Down syndrome, unspecified: Secondary | ICD-10-CM | POA: Diagnosis not present

## 2017-08-14 DIAGNOSIS — Q909 Down syndrome, unspecified: Secondary | ICD-10-CM | POA: Diagnosis not present

## 2017-08-15 DIAGNOSIS — Q909 Down syndrome, unspecified: Secondary | ICD-10-CM | POA: Diagnosis not present

## 2017-08-16 DIAGNOSIS — Q909 Down syndrome, unspecified: Secondary | ICD-10-CM | POA: Diagnosis not present

## 2017-08-18 DIAGNOSIS — Q909 Down syndrome, unspecified: Secondary | ICD-10-CM | POA: Diagnosis not present

## 2017-08-19 DIAGNOSIS — Q909 Down syndrome, unspecified: Secondary | ICD-10-CM | POA: Diagnosis not present

## 2017-08-20 DIAGNOSIS — Q909 Down syndrome, unspecified: Secondary | ICD-10-CM | POA: Diagnosis not present

## 2017-08-21 DIAGNOSIS — Q909 Down syndrome, unspecified: Secondary | ICD-10-CM | POA: Diagnosis not present

## 2017-08-22 DIAGNOSIS — Q909 Down syndrome, unspecified: Secondary | ICD-10-CM | POA: Diagnosis not present

## 2017-08-23 DIAGNOSIS — Q909 Down syndrome, unspecified: Secondary | ICD-10-CM | POA: Diagnosis not present

## 2017-08-25 DIAGNOSIS — Q909 Down syndrome, unspecified: Secondary | ICD-10-CM | POA: Diagnosis not present

## 2017-08-26 DIAGNOSIS — Q909 Down syndrome, unspecified: Secondary | ICD-10-CM | POA: Diagnosis not present

## 2017-08-27 DIAGNOSIS — Q909 Down syndrome, unspecified: Secondary | ICD-10-CM | POA: Diagnosis not present

## 2017-08-28 DIAGNOSIS — Q909 Down syndrome, unspecified: Secondary | ICD-10-CM | POA: Diagnosis not present

## 2017-08-29 DIAGNOSIS — Q909 Down syndrome, unspecified: Secondary | ICD-10-CM | POA: Diagnosis not present

## 2017-08-30 DIAGNOSIS — Q909 Down syndrome, unspecified: Secondary | ICD-10-CM | POA: Diagnosis not present

## 2017-09-01 DIAGNOSIS — Q909 Down syndrome, unspecified: Secondary | ICD-10-CM | POA: Diagnosis not present

## 2017-09-02 DIAGNOSIS — Q909 Down syndrome, unspecified: Secondary | ICD-10-CM | POA: Diagnosis not present

## 2017-09-03 DIAGNOSIS — Q909 Down syndrome, unspecified: Secondary | ICD-10-CM | POA: Diagnosis not present

## 2017-09-04 DIAGNOSIS — Q909 Down syndrome, unspecified: Secondary | ICD-10-CM | POA: Diagnosis not present

## 2017-09-05 DIAGNOSIS — Q909 Down syndrome, unspecified: Secondary | ICD-10-CM | POA: Diagnosis not present

## 2017-09-06 DIAGNOSIS — Q909 Down syndrome, unspecified: Secondary | ICD-10-CM | POA: Diagnosis not present

## 2017-09-08 DIAGNOSIS — Q909 Down syndrome, unspecified: Secondary | ICD-10-CM | POA: Diagnosis not present

## 2017-09-09 DIAGNOSIS — Q909 Down syndrome, unspecified: Secondary | ICD-10-CM | POA: Diagnosis not present

## 2017-09-11 DIAGNOSIS — Q909 Down syndrome, unspecified: Secondary | ICD-10-CM | POA: Diagnosis not present

## 2017-09-12 DIAGNOSIS — Q909 Down syndrome, unspecified: Secondary | ICD-10-CM | POA: Diagnosis not present

## 2017-09-13 DIAGNOSIS — Q909 Down syndrome, unspecified: Secondary | ICD-10-CM | POA: Diagnosis not present

## 2017-09-14 ENCOUNTER — Other Ambulatory Visit: Payer: Self-pay | Admitting: Family Medicine

## 2017-09-15 DIAGNOSIS — Q909 Down syndrome, unspecified: Secondary | ICD-10-CM | POA: Diagnosis not present

## 2017-09-16 DIAGNOSIS — Q909 Down syndrome, unspecified: Secondary | ICD-10-CM | POA: Diagnosis not present

## 2017-09-17 DIAGNOSIS — Q909 Down syndrome, unspecified: Secondary | ICD-10-CM | POA: Diagnosis not present

## 2017-09-18 DIAGNOSIS — Q909 Down syndrome, unspecified: Secondary | ICD-10-CM | POA: Diagnosis not present

## 2017-09-19 DIAGNOSIS — Q909 Down syndrome, unspecified: Secondary | ICD-10-CM | POA: Diagnosis not present

## 2017-09-20 DIAGNOSIS — Q909 Down syndrome, unspecified: Secondary | ICD-10-CM | POA: Diagnosis not present

## 2017-09-22 DIAGNOSIS — Q909 Down syndrome, unspecified: Secondary | ICD-10-CM | POA: Diagnosis not present

## 2017-09-23 DIAGNOSIS — Q909 Down syndrome, unspecified: Secondary | ICD-10-CM | POA: Diagnosis not present

## 2017-09-24 DIAGNOSIS — Q909 Down syndrome, unspecified: Secondary | ICD-10-CM | POA: Diagnosis not present

## 2017-09-25 DIAGNOSIS — Q909 Down syndrome, unspecified: Secondary | ICD-10-CM | POA: Diagnosis not present

## 2017-09-26 DIAGNOSIS — Q909 Down syndrome, unspecified: Secondary | ICD-10-CM | POA: Diagnosis not present

## 2017-09-27 DIAGNOSIS — Q909 Down syndrome, unspecified: Secondary | ICD-10-CM | POA: Diagnosis not present

## 2017-09-29 DIAGNOSIS — Q909 Down syndrome, unspecified: Secondary | ICD-10-CM | POA: Diagnosis not present

## 2017-09-30 DIAGNOSIS — Q909 Down syndrome, unspecified: Secondary | ICD-10-CM | POA: Diagnosis not present

## 2017-10-01 DIAGNOSIS — Q909 Down syndrome, unspecified: Secondary | ICD-10-CM | POA: Diagnosis not present

## 2017-10-02 DIAGNOSIS — Q909 Down syndrome, unspecified: Secondary | ICD-10-CM | POA: Diagnosis not present

## 2017-10-03 DIAGNOSIS — Q909 Down syndrome, unspecified: Secondary | ICD-10-CM | POA: Diagnosis not present

## 2017-10-04 DIAGNOSIS — Q909 Down syndrome, unspecified: Secondary | ICD-10-CM | POA: Diagnosis not present

## 2017-10-06 DIAGNOSIS — Q909 Down syndrome, unspecified: Secondary | ICD-10-CM | POA: Diagnosis not present

## 2017-10-07 DIAGNOSIS — Q909 Down syndrome, unspecified: Secondary | ICD-10-CM | POA: Diagnosis not present

## 2017-10-08 DIAGNOSIS — Q909 Down syndrome, unspecified: Secondary | ICD-10-CM | POA: Diagnosis not present

## 2017-10-09 DIAGNOSIS — Q909 Down syndrome, unspecified: Secondary | ICD-10-CM | POA: Diagnosis not present

## 2017-10-10 DIAGNOSIS — Q909 Down syndrome, unspecified: Secondary | ICD-10-CM | POA: Diagnosis not present

## 2017-10-11 DIAGNOSIS — Q909 Down syndrome, unspecified: Secondary | ICD-10-CM | POA: Diagnosis not present

## 2017-10-13 DIAGNOSIS — Q909 Down syndrome, unspecified: Secondary | ICD-10-CM | POA: Diagnosis not present

## 2017-10-14 DIAGNOSIS — Q909 Down syndrome, unspecified: Secondary | ICD-10-CM | POA: Diagnosis not present

## 2017-10-15 DIAGNOSIS — Q909 Down syndrome, unspecified: Secondary | ICD-10-CM | POA: Diagnosis not present

## 2017-10-16 DIAGNOSIS — Q909 Down syndrome, unspecified: Secondary | ICD-10-CM | POA: Diagnosis not present

## 2017-10-17 DIAGNOSIS — Q909 Down syndrome, unspecified: Secondary | ICD-10-CM | POA: Diagnosis not present

## 2017-10-18 DIAGNOSIS — Q909 Down syndrome, unspecified: Secondary | ICD-10-CM | POA: Diagnosis not present

## 2017-10-20 DIAGNOSIS — Q909 Down syndrome, unspecified: Secondary | ICD-10-CM | POA: Diagnosis not present

## 2017-10-21 DIAGNOSIS — Q909 Down syndrome, unspecified: Secondary | ICD-10-CM | POA: Diagnosis not present

## 2017-10-22 DIAGNOSIS — Q909 Down syndrome, unspecified: Secondary | ICD-10-CM | POA: Diagnosis not present

## 2017-10-23 DIAGNOSIS — Q909 Down syndrome, unspecified: Secondary | ICD-10-CM | POA: Diagnosis not present

## 2017-10-24 DIAGNOSIS — Q909 Down syndrome, unspecified: Secondary | ICD-10-CM | POA: Diagnosis not present

## 2017-10-25 DIAGNOSIS — Q909 Down syndrome, unspecified: Secondary | ICD-10-CM | POA: Diagnosis not present

## 2017-10-27 DIAGNOSIS — Q909 Down syndrome, unspecified: Secondary | ICD-10-CM | POA: Diagnosis not present

## 2017-10-28 DIAGNOSIS — Q909 Down syndrome, unspecified: Secondary | ICD-10-CM | POA: Diagnosis not present

## 2017-10-29 DIAGNOSIS — Q909 Down syndrome, unspecified: Secondary | ICD-10-CM | POA: Diagnosis not present

## 2017-10-30 DIAGNOSIS — Q909 Down syndrome, unspecified: Secondary | ICD-10-CM | POA: Diagnosis not present

## 2017-10-31 DIAGNOSIS — Q909 Down syndrome, unspecified: Secondary | ICD-10-CM | POA: Diagnosis not present

## 2017-11-01 DIAGNOSIS — Q909 Down syndrome, unspecified: Secondary | ICD-10-CM | POA: Diagnosis not present

## 2017-11-03 DIAGNOSIS — Q909 Down syndrome, unspecified: Secondary | ICD-10-CM | POA: Diagnosis not present

## 2017-11-04 DIAGNOSIS — Q909 Down syndrome, unspecified: Secondary | ICD-10-CM | POA: Diagnosis not present

## 2017-11-05 DIAGNOSIS — Q909 Down syndrome, unspecified: Secondary | ICD-10-CM | POA: Diagnosis not present

## 2017-11-06 DIAGNOSIS — Q909 Down syndrome, unspecified: Secondary | ICD-10-CM | POA: Diagnosis not present

## 2017-11-07 ENCOUNTER — Encounter: Payer: Commercial Managed Care - HMO | Admitting: Family Medicine

## 2017-11-07 DIAGNOSIS — Q909 Down syndrome, unspecified: Secondary | ICD-10-CM | POA: Diagnosis not present

## 2017-11-08 DIAGNOSIS — Q909 Down syndrome, unspecified: Secondary | ICD-10-CM | POA: Diagnosis not present

## 2017-11-10 DIAGNOSIS — Q909 Down syndrome, unspecified: Secondary | ICD-10-CM | POA: Diagnosis not present

## 2017-11-11 DIAGNOSIS — Q909 Down syndrome, unspecified: Secondary | ICD-10-CM | POA: Diagnosis not present

## 2017-11-12 DIAGNOSIS — Q909 Down syndrome, unspecified: Secondary | ICD-10-CM | POA: Diagnosis not present

## 2017-11-13 DIAGNOSIS — Q909 Down syndrome, unspecified: Secondary | ICD-10-CM | POA: Diagnosis not present

## 2017-11-14 DIAGNOSIS — Q909 Down syndrome, unspecified: Secondary | ICD-10-CM | POA: Diagnosis not present

## 2017-11-15 DIAGNOSIS — Q909 Down syndrome, unspecified: Secondary | ICD-10-CM | POA: Diagnosis not present

## 2017-11-17 DIAGNOSIS — Q909 Down syndrome, unspecified: Secondary | ICD-10-CM | POA: Diagnosis not present

## 2017-11-18 ENCOUNTER — Encounter: Payer: Self-pay | Admitting: Family Medicine

## 2017-11-18 ENCOUNTER — Ambulatory Visit (INDEPENDENT_AMBULATORY_CARE_PROVIDER_SITE_OTHER): Payer: Medicare HMO

## 2017-11-18 ENCOUNTER — Ambulatory Visit (INDEPENDENT_AMBULATORY_CARE_PROVIDER_SITE_OTHER): Payer: Medicare HMO | Admitting: Family Medicine

## 2017-11-18 VITALS — BP 100/60 | HR 68 | Temp 99.0°F | Resp 12 | Ht 63.0 in | Wt 164.2 lb

## 2017-11-18 VITALS — BP 100/60 | HR 68 | Temp 99.0°F | Resp 12 | Ht 63.0 in | Wt 164.0 lb

## 2017-11-18 DIAGNOSIS — H6123 Impacted cerumen, bilateral: Secondary | ICD-10-CM

## 2017-11-18 DIAGNOSIS — Q909 Down syndrome, unspecified: Secondary | ICD-10-CM | POA: Diagnosis not present

## 2017-11-18 DIAGNOSIS — Z0001 Encounter for general adult medical examination with abnormal findings: Secondary | ICD-10-CM | POA: Diagnosis not present

## 2017-11-18 DIAGNOSIS — Z125 Encounter for screening for malignant neoplasm of prostate: Secondary | ICD-10-CM

## 2017-11-18 DIAGNOSIS — Z7409 Other reduced mobility: Secondary | ICD-10-CM | POA: Diagnosis not present

## 2017-11-18 DIAGNOSIS — Z0000 Encounter for general adult medical examination without abnormal findings: Secondary | ICD-10-CM | POA: Diagnosis not present

## 2017-11-18 DIAGNOSIS — Z1159 Encounter for screening for other viral diseases: Secondary | ICD-10-CM | POA: Diagnosis not present

## 2017-11-18 DIAGNOSIS — Z789 Other specified health status: Secondary | ICD-10-CM
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
## 2017-11-18 NOTE — Patient Instructions (Signed)
Joseph Flores , Thank you for taking time to come for your Medicare Wellness Visit. I appreciate your ongoing commitment to your health goals. Please review the following plan we discussed and let me know if I can assist you in the future.   Screening recommendations/referrals: Colonoscopy: Completed 09/19/16. Repeat every 5 years Lung: NON-SMOKER. You do not qualify for this exam Hepatitis C Screening: You do not qualify for this test HIV/Syphilis/Hepatitis B Screening: You do not qualify for this test  Vision/Dental Exams: Recommended yearly ophthalmology/optometry visit for glaucoma screening and checkup Recommended yearly dental visit for hygiene and checkup  Vaccinations: Influenza vaccine: Declined Pneumococcal vaccine: Not required at this time Tdap vaccine: Completed 11/11/10 Shingles vaccine: Not required at this time   Advanced directives: Please bring a copy of your POA (Power of [place]) and/or Living Will to your next appointment.   Conditions/risks identified: Recommend to keep pt involved in activities for mental stimulation.  Next appointment: You are scheduled to see Dr. Manuella Ghazi on 11/18/17 @ 11:00am.   Please schedule your Annual Wellness Visit with your Nurse Health Advisor in one year.  Preventive Care 40-64 Years, Male Preventive care refers to lifestyle choices and visits with your health care provider that can promote health and wellness. What does preventive care include?  A yearly physical exam. This is also called an annual well check.  Dental exams once or twice a year.  Routine eye exams. Ask your health care provider how often you should have your eyes checked.  Personal lifestyle choices, including:  Daily care of your teeth and gums.  Regular physical activity.  Eating a healthy diet.  Avoiding tobacco and drug use.  Limiting alcohol use.  Practicing safe sex.  Taking low-dose aspirin every day starting at age 46. What happens during an  annual well check? The services and screenings done by your health care provider during your annual well check will depend on your age, overall health, lifestyle risk factors, and family history of disease. Counseling  Your health care provider may ask you questions about your:  Alcohol use.  Tobacco use.  Drug use.  Emotional well-being.  Home and relationship well-being.  Sexual activity.  Eating habits.  Work and work Statistician. Screening  You may have the following tests or measurements:  Height, weight, and BMI.  Blood pressure.  Lipid and cholesterol levels. These may be checked every 5 years, or more frequently if you are over 57 years old.  Skin check.  Lung cancer screening. You may have this screening every year starting at age 16 if you have a 30-pack-year history of smoking and currently smoke or have quit within the past 15 years.  Fecal occult blood test (FOBT) of the stool. You may have this test every year starting at age 101.  Flexible sigmoidoscopy or colonoscopy. You may have a sigmoidoscopy every 5 years or a colonoscopy every 10 years starting at age 19.  Prostate cancer screening. Recommendations will vary depending on your family history and other risks.  Hepatitis C blood test.  Hepatitis B blood test.  Sexually transmitted disease (STD) testing.  Diabetes screening. This is done by checking your blood sugar (glucose) after you have not eaten for a while (fasting). You may have this done every 1-3 years. Discuss your test results, treatment options, and if necessary, the need for more tests with your health care provider. Vaccines  Your health care provider may recommend certain vaccines, such as:  Influenza vaccine. This is recommended  every year.  Tetanus, diphtheria, and acellular pertussis (Tdap, Td) vaccine. You may need a Td booster every 10 years.  Zoster vaccine. You may need this after age 10.  Pneumococcal 13-valent conjugate  (PCV13) vaccine. You may need this if you have certain conditions and have not been vaccinated.  Pneumococcal polysaccharide (PPSV23) vaccine. You may need one or two doses if you smoke cigarettes or if you have certain conditions. Talk to your health care provider about which screenings and vaccines you need and how often you need them. This information is not intended to replace advice given to you by your health care provider. Make sure you discuss any questions you have with your health care provider. Document Released: 11/24/2015 Document Revised: 07/17/2016 Document Reviewed: 08/29/2015 Elsevier Interactive Patient Education  2017 [place] Prevention in the Home Falls can cause injuries. They can happen to people of all ages. There are many things you can do to make your home safe and to help prevent falls. What can I do on the outside of my home?  Regularly fix the edges of walkways and driveways and fix any cracks.  Remove anything that might make you trip as you walk through a door, such as a raised step or threshold.  Trim any bushes or trees on the path to your home.  Use bright outdoor lighting.  Clear any walking paths of anything that might make someone trip, such as rocks or tools.  Regularly check to see if handrails are loose or broken. Make sure that both sides of any steps have handrails.  Any raised decks and porches should have guardrails on the edges.  Have any leaves, snow, or ice cleared regularly.  Use sand or salt on walking paths during winter.  Clean up any spills in your garage right away. This includes oil or grease spills. What can I do in the bathroom?  Use night lights.  Install grab bars by the toilet and in the tub and shower. Do not use towel bars as grab bars.  Use non-skid mats or decals in the tub or shower.  If you need to sit down in the shower, use a plastic, non-slip stool.  Keep the floor dry. Clean up any water that  spills on the floor as soon as it happens.  Remove soap buildup in the tub or shower regularly.  Attach bath mats securely with double-sided non-slip rug tape.  Do not have throw rugs and other things on the floor that can make you trip. What can I do in the bedroom?  Use night lights.  Make sure that you have a light by your bed that is easy to reach.  Do not use any sheets or blankets that are too big for your bed. They should not hang down onto the floor.  Have a firm chair that has side arms. You can use this for support while you get dressed.  Do not have throw rugs and other things on the floor that can make you trip. What can I do in the kitchen?  Clean up any spills right away.  Avoid walking on wet floors.  Keep items that you use a lot in easy-to-reach places.  If you need to reach something above you, use a strong step stool that has a grab bar.  Keep electrical cords out of the way.  Do not use floor polish or wax that makes floors slippery. If you must use wax, use non-skid floor wax.  Do not have throw rugs and other things on the floor that can make you trip. What can I do with my stairs?  Do not leave any items on the stairs.  Make sure that there are handrails on both sides of the stairs and use them. Fix handrails that are broken or loose. Make sure that handrails are as long as the stairways.  Check any carpeting to make sure that it is firmly attached to the stairs. Fix any carpet that is loose or worn.  Avoid having throw rugs at the top or bottom of the stairs. If you do have throw rugs, attach them to the floor with carpet tape.  Make sure that you have a light switch at the top of the stairs and the bottom of the stairs. If you do not have them, ask someone to add them for you. What else can I do to help prevent falls?  Wear shoes that:  Do not have high heels.  Have rubber bottoms.  Are comfortable and fit you well.  Are closed at the  toe. Do not wear sandals.  If you use a stepladder:  Make sure that it is fully opened. Do not climb a closed stepladder.  Make sure that both sides of the stepladder are locked into place.  Ask someone to hold it for you, if possible.  Clearly mark and make sure that you can see:  Any grab bars or handrails.  First and last steps.  Where the edge of each step is.  Use tools that help you move around (mobility aids) if they are needed. These include:  Canes.  Walkers.  Scooters.  Crutches.  Turn on the lights when you go into a dark area. Replace any light bulbs as soon as they burn out.  Set up your furniture so you have a clear path. Avoid moving your furniture around.  If any of your floors are uneven, fix them.  If there are any pets around you, be aware of where they are.  Review your medicines with your doctor. Some medicines can make you feel dizzy. This can increase your chance of falling. Ask your doctor what other things that you can do to help prevent falls. This information is not intended to replace advice given to you by your health care provider. Make sure you discuss any questions you have with your health care provider. Document Released: 08/24/2009 Document Revised: 04/04/2016 Document Reviewed: 12/02/2014 Elsevier Interactive Patient Education  2017 [employer].
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
## 2017-11-19 DIAGNOSIS — Z789 Other specified health status: Secondary | ICD-10-CM | POA: Diagnosis not present

## 2017-11-19 DIAGNOSIS — M5136 Other intervertebral disc degeneration, lumbar region: Secondary | ICD-10-CM | POA: Diagnosis not present

## 2017-11-19 DIAGNOSIS — M1990 Unspecified osteoarthritis, unspecified site: Secondary | ICD-10-CM | POA: Diagnosis not present

## 2017-11-19 DIAGNOSIS — Q909 Down syndrome, unspecified: Secondary | ICD-10-CM | POA: Diagnosis not present

## 2017-11-20 DIAGNOSIS — Q909 Down syndrome, unspecified: Secondary | ICD-10-CM | POA: Diagnosis not present

## 2017-11-21 DIAGNOSIS — Q909 Down syndrome, unspecified: Secondary | ICD-10-CM | POA: Diagnosis not present

## 2017-11-22 DIAGNOSIS — Q909 Down syndrome, unspecified: Secondary | ICD-10-CM | POA: Diagnosis not present

## 2017-11-24 DIAGNOSIS — Q909 Down syndrome, unspecified: Secondary | ICD-10-CM | POA: Diagnosis not present

## 2017-11-25 DIAGNOSIS — Q909 Down syndrome, unspecified: Secondary | ICD-10-CM | POA: Diagnosis not present

## 2017-11-26 DIAGNOSIS — Q909 Down syndrome, unspecified: Secondary | ICD-10-CM | POA: Diagnosis not present

## 2017-11-27 DIAGNOSIS — Q909 Down syndrome, unspecified: Secondary | ICD-10-CM | POA: Diagnosis not present

## 2017-11-28 DIAGNOSIS — Q909 Down syndrome, unspecified: Secondary | ICD-10-CM | POA: Diagnosis not present

## 2017-11-29 DIAGNOSIS — Q909 Down syndrome, unspecified: Secondary | ICD-10-CM | POA: Diagnosis not present

## 2017-12-01 DIAGNOSIS — Q909 Down syndrome, unspecified: Secondary | ICD-10-CM | POA: Diagnosis not present

## 2017-12-02 DIAGNOSIS — Q909 Down syndrome, unspecified: Secondary | ICD-10-CM | POA: Diagnosis not present

## 2017-12-03 DIAGNOSIS — Q909 Down syndrome, unspecified: Secondary | ICD-10-CM | POA: Diagnosis not present

## 2017-12-04 DIAGNOSIS — Q909 Down syndrome, unspecified: Secondary | ICD-10-CM | POA: Diagnosis not present

## 2017-12-05 ENCOUNTER — Emergency Department
Admission: EM | Admit: 2017-12-05 | Discharge: 2017-12-05 | Disposition: A | Discharge status: Home / Self Care | Payer: Medicare HMO | Attending: Emergency Medicine | Admitting: Emergency Medicine

## 2017-12-05 ENCOUNTER — Emergency Department: Payer: Medicare HMO

## 2017-12-05 ENCOUNTER — Encounter: Payer: Self-pay | Admitting: Emergency Medicine

## 2017-12-05 DIAGNOSIS — M549 Dorsalgia, unspecified: Secondary | ICD-10-CM | POA: Diagnosis not present

## 2017-12-05 DIAGNOSIS — Y999 Unspecified external cause status: Secondary | ICD-10-CM | POA: Insufficient documentation

## 2017-12-05 DIAGNOSIS — M545 Low back pain: Secondary | ICD-10-CM | POA: Diagnosis not present

## 2017-12-05 DIAGNOSIS — S0990XA Unspecified injury of head, initial encounter: Secondary | ICD-10-CM | POA: Diagnosis not present

## 2017-12-05 DIAGNOSIS — R296 Repeated falls: Secondary | ICD-10-CM | POA: Diagnosis not present

## 2017-12-05 DIAGNOSIS — Y939 Activity, unspecified: Secondary | ICD-10-CM | POA: Diagnosis not present

## 2017-12-05 DIAGNOSIS — Q909 Down syndrome, unspecified: Secondary | ICD-10-CM | POA: Diagnosis not present

## 2017-12-05 DIAGNOSIS — Y92002 Bathroom of unspecified non-institutional (private) residence single-family (private) house as the place of occurrence of the external cause: Secondary | ICD-10-CM | POA: Diagnosis not present

## 2017-12-05 DIAGNOSIS — W19XXXA Unspecified fall, initial encounter: Secondary | ICD-10-CM | POA: Diagnosis not present

## 2017-12-05 DIAGNOSIS — M546 Pain in thoracic spine: Secondary | ICD-10-CM | POA: Diagnosis not present

## 2017-12-05 NOTE — ED Notes (Signed)
Patient transported to CT 

## 2017-12-05 NOTE — ED Triage Notes (Signed)
Pt in via [facility] from home with complaints of multiple falls today, complaints of back pain w/ difficulty ambulation since the fall.  Pt with downs syndrome, alert to self only at baseline.  NAD noted at this time.
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
## 2017-12-06 DIAGNOSIS — R5383 Other fatigue: Secondary | ICD-10-CM | POA: Diagnosis not present

## 2017-12-06 DIAGNOSIS — R509 Fever, unspecified: Secondary | ICD-10-CM | POA: Diagnosis not present

## 2017-12-06 DIAGNOSIS — M549 Dorsalgia, unspecified: Secondary | ICD-10-CM | POA: Diagnosis not present

## 2017-12-06 DIAGNOSIS — R410 Disorientation, unspecified: Secondary | ICD-10-CM | POA: Diagnosis not present

## 2017-12-06 DIAGNOSIS — Q909 Down syndrome, unspecified: Secondary | ICD-10-CM | POA: Diagnosis not present

## 2017-12-06 DIAGNOSIS — G8929 Other chronic pain: Secondary | ICD-10-CM | POA: Diagnosis not present

## 2017-12-06 DIAGNOSIS — G3184 Mild cognitive impairment, so stated: Secondary | ICD-10-CM | POA: Diagnosis not present

## 2017-12-06 DIAGNOSIS — R260 Ataxic gait: Secondary | ICD-10-CM | POA: Diagnosis not present

## 2017-12-06 DIAGNOSIS — M546 Pain in thoracic spine: Secondary | ICD-10-CM | POA: Diagnosis not present

## 2017-12-07 DIAGNOSIS — M542 Cervicalgia: Secondary | ICD-10-CM | POA: Diagnosis not present

## 2017-12-07 DIAGNOSIS — M129 Arthropathy, unspecified: Secondary | ICD-10-CM | POA: Diagnosis not present

## 2017-12-07 DIAGNOSIS — D649 Anemia, unspecified: Secondary | ICD-10-CM | POA: Diagnosis not present

## 2017-12-07 DIAGNOSIS — M5137 Other intervertebral disc degeneration, lumbosacral region: Secondary | ICD-10-CM | POA: Diagnosis not present

## 2017-12-07 DIAGNOSIS — M5030 Other cervical disc degeneration, unspecified cervical region: Secondary | ICD-10-CM | POA: Diagnosis not present

## 2017-12-07 DIAGNOSIS — E785 Hyperlipidemia, unspecified: Secondary | ICD-10-CM | POA: Diagnosis not present

## 2017-12-07 DIAGNOSIS — Z809 Family history of malignant neoplasm, unspecified: Secondary | ICD-10-CM | POA: Diagnosis not present

## 2017-12-07 DIAGNOSIS — M546 Pain in thoracic spine: Secondary | ICD-10-CM | POA: Diagnosis not present

## 2017-12-07 DIAGNOSIS — M47817 Spondylosis without myelopathy or radiculopathy, lumbosacral region: Secondary | ICD-10-CM | POA: Diagnosis not present

## 2017-12-07 DIAGNOSIS — Z1211 Encounter for screening for malignant neoplasm of colon: Secondary | ICD-10-CM | POA: Diagnosis not present

## 2017-12-07 DIAGNOSIS — S299XXA Unspecified injury of thorax, initial encounter: Secondary | ICD-10-CM | POA: Diagnosis not present

## 2017-12-07 DIAGNOSIS — M4316 Spondylolisthesis, lumbar region: Secondary | ICD-10-CM | POA: Diagnosis not present

## 2017-12-07 DIAGNOSIS — Q909 Down syndrome, unspecified: Secondary | ICD-10-CM | POA: Diagnosis not present

## 2017-12-07 DIAGNOSIS — S3992XA Unspecified injury of lower back, initial encounter: Secondary | ICD-10-CM | POA: Diagnosis not present

## 2017-12-08 DIAGNOSIS — Q909 Down syndrome, unspecified: Secondary | ICD-10-CM | POA: Diagnosis not present

## 2017-12-09 DIAGNOSIS — N183 Chronic kidney disease, stage 3 (moderate): Secondary | ICD-10-CM | POA: Diagnosis not present

## 2017-12-09 DIAGNOSIS — Z9181 History of falling: Secondary | ICD-10-CM | POA: Diagnosis not present

## 2017-12-09 DIAGNOSIS — G919 Hydrocephalus, unspecified: Secondary | ICD-10-CM | POA: Diagnosis not present

## 2017-12-09 DIAGNOSIS — R29818 Other symptoms and signs involving the nervous system: Secondary | ICD-10-CM | POA: Diagnosis not present

## 2017-12-09 DIAGNOSIS — R296 Repeated falls: Secondary | ICD-10-CM | POA: Diagnosis not present

## 2017-12-09 DIAGNOSIS — Q909 Down syndrome, unspecified: Secondary | ICD-10-CM | POA: Diagnosis not present

## 2017-12-10 ENCOUNTER — Ambulatory Visit: Payer: Medicare HMO | Attending: Family Medicine | Admitting: Physical Therapy

## 2017-12-10 ENCOUNTER — Encounter: Payer: Self-pay | Admitting: Physical Therapy

## 2017-12-10 DIAGNOSIS — R269 Unspecified abnormalities of gait and mobility: Secondary | ICD-10-CM | POA: Insufficient documentation

## 2017-12-10 DIAGNOSIS — M6281 Muscle weakness (generalized): Secondary | ICD-10-CM | POA: Diagnosis not present

## 2017-12-10 DIAGNOSIS — Q909 Down syndrome, unspecified: Secondary | ICD-10-CM | POA: Diagnosis not present
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
## 2017-12-11 DIAGNOSIS — Q909 Down syndrome, unspecified: Secondary | ICD-10-CM | POA: Diagnosis not present

## 2017-12-12 DIAGNOSIS — Q909 Down syndrome, unspecified: Secondary | ICD-10-CM | POA: Diagnosis not present

## 2017-12-13 DIAGNOSIS — Q909 Down syndrome, unspecified: Secondary | ICD-10-CM | POA: Diagnosis not present

## 2017-12-15 ENCOUNTER — Emergency Department
Admission: EM | Admit: 2017-12-15 | Discharge: 2017-12-15 | Discharge status: Against Medical Advice | Payer: Medicare HMO

## 2017-12-15 ENCOUNTER — Encounter: Payer: Medicare HMO | Admitting: Physical Therapy

## 2017-12-15 DIAGNOSIS — Q909 Down syndrome, unspecified: Secondary | ICD-10-CM | POA: Diagnosis not present

## 2017-12-15 DIAGNOSIS — F99 Mental disorder, not otherwise specified: Secondary | ICD-10-CM | POA: Diagnosis not present

## 2017-12-15 NOTE — ED Notes (Addendum)
FIRST NURSE NOTE:  Arrived via EMS from home, was found walking around outside with no shoes. Pt lives at home with family.  VSS with EMS Pt c/o back pain. Pt alert at this time, respirations equal and unlabored. Placed in wheelchair in lobby.

## 2017-12-15 NOTE — ED Notes (Signed)
Pt's sister Joseph Flores arrived to ED to pick up patient, states the patient does not need to be seen and was found walking outside.  Joseph Flores is not the patient's legal guardian. I called patient's legal guardian who is another sister Joseph Flores at [phone].  Joseph Flores gave permission via telephone for patient to be discharged back to [place] being seen by a provider for his complaint of back pain. Both Joseph Flores and Joseph Flores verified patient has a provider available and his back pain is not something new.  I had Joseph Flores sign AMA form.  I also instructed Joseph Flores and Joseph Flores that if they felt the patient needed to be seen by a provider in the ED to bring him back anytime.

## 2017-12-16 DIAGNOSIS — Q909 Down syndrome, unspecified: Secondary | ICD-10-CM | POA: Diagnosis not present

## 2017-12-17 ENCOUNTER — Encounter: Payer: Medicare HMO | Admitting: Physical Therapy

## 2017-12-17 DIAGNOSIS — Q909 Down syndrome, unspecified: Secondary | ICD-10-CM | POA: Diagnosis not present

## 2017-12-18 ENCOUNTER — Encounter: Payer: Medicare HMO | Admitting: Physical Therapy

## 2017-12-18 DIAGNOSIS — Q909 Down syndrome, unspecified: Secondary | ICD-10-CM | POA: Diagnosis not present

## 2017-12-19 DIAGNOSIS — Q909 Down syndrome, unspecified: Secondary | ICD-10-CM | POA: Diagnosis not present

## 2017-12-20 DIAGNOSIS — Q909 Down syndrome, unspecified: Secondary | ICD-10-CM | POA: Diagnosis not present

## 2017-12-22 ENCOUNTER — Encounter: Payer: Medicare HMO | Admitting: Physical Therapy

## 2017-12-22 DIAGNOSIS — Q909 Down syndrome, unspecified: Secondary | ICD-10-CM | POA: Diagnosis not present

## 2017-12-23 ENCOUNTER — Ambulatory Visit: Payer: Medicare HMO | Attending: Family Medicine | Admitting: Physical Therapy

## 2017-12-23 DIAGNOSIS — G8929 Other chronic pain: Secondary | ICD-10-CM | POA: Diagnosis not present

## 2017-12-23 DIAGNOSIS — M6281 Muscle weakness (generalized): Secondary | ICD-10-CM | POA: Insufficient documentation

## 2017-12-23 DIAGNOSIS — Q909 Down syndrome, unspecified: Secondary | ICD-10-CM | POA: Diagnosis not present

## 2017-12-23 DIAGNOSIS — M545 Low back pain: Secondary | ICD-10-CM | POA: Insufficient documentation

## 2017-12-23 DIAGNOSIS — R269 Unspecified abnormalities of gait and mobility: Secondary | ICD-10-CM | POA: Insufficient documentation

## 2017-12-24 ENCOUNTER — Ambulatory Visit: Payer: Medicare HMO | Admitting: Physical Therapy

## 2017-12-24 ENCOUNTER — Encounter: Payer: Self-pay | Admitting: Physical Therapy

## 2017-12-24 DIAGNOSIS — G8929 Other chronic pain: Secondary | ICD-10-CM | POA: Diagnosis not present

## 2017-12-24 DIAGNOSIS — Q909 Down syndrome, unspecified: Secondary | ICD-10-CM | POA: Diagnosis not present

## 2017-12-24 DIAGNOSIS — M6281 Muscle weakness (generalized): Secondary | ICD-10-CM

## 2017-12-24 DIAGNOSIS — M545 Low back pain: Secondary | ICD-10-CM | POA: Diagnosis not present

## 2017-12-24 DIAGNOSIS — R269 Unspecified abnormalities of gait and mobility: Secondary | ICD-10-CM | POA: Diagnosis not present
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
## 2017-12-25 DIAGNOSIS — Q909 Down syndrome, unspecified: Secondary | ICD-10-CM | POA: Diagnosis not present
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
## 2017-12-26 DIAGNOSIS — Q909 Down syndrome, unspecified: Secondary | ICD-10-CM | POA: Diagnosis not present

## 2017-12-27 DIAGNOSIS — Q909 Down syndrome, unspecified: Secondary | ICD-10-CM | POA: Diagnosis not present

## 2017-12-29 ENCOUNTER — Ambulatory Visit: Payer: Medicare HMO | Admitting: Physical Therapy

## 2017-12-29 DIAGNOSIS — G8929 Other chronic pain: Secondary | ICD-10-CM | POA: Diagnosis not present

## 2017-12-29 DIAGNOSIS — M545 Low back pain: Secondary | ICD-10-CM | POA: Diagnosis not present

## 2017-12-29 DIAGNOSIS — R269 Unspecified abnormalities of gait and mobility: Secondary | ICD-10-CM | POA: Diagnosis not present

## 2017-12-29 DIAGNOSIS — M6281 Muscle weakness (generalized): Secondary | ICD-10-CM | POA: Diagnosis not present

## 2017-12-29 DIAGNOSIS — Q909 Down syndrome, unspecified: Secondary | ICD-10-CM | POA: Diagnosis not present

## 2017-12-30 DIAGNOSIS — Q909 Down syndrome, unspecified: Secondary | ICD-10-CM | POA: Diagnosis not present

## 2017-12-31 ENCOUNTER — Encounter: Payer: Self-pay | Admitting: Physical Therapy

## 2017-12-31 ENCOUNTER — Ambulatory Visit: Payer: Medicare HMO | Admitting: Physical Therapy

## 2017-12-31 DIAGNOSIS — R269 Unspecified abnormalities of gait and mobility: Secondary | ICD-10-CM

## 2017-12-31 DIAGNOSIS — Q909 Down syndrome, unspecified: Secondary | ICD-10-CM | POA: Diagnosis not present

## 2017-12-31 DIAGNOSIS — G8929 Other chronic pain: Secondary | ICD-10-CM | POA: Diagnosis not present

## 2017-12-31 DIAGNOSIS — M545 Low back pain: Secondary | ICD-10-CM | POA: Diagnosis not present

## 2017-12-31 DIAGNOSIS — M6281 Muscle weakness (generalized): Secondary | ICD-10-CM | POA: Diagnosis not present
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
## 2018-01-01 DIAGNOSIS — Q909 Down syndrome, unspecified: Secondary | ICD-10-CM | POA: Diagnosis not present

## 2018-01-02 DIAGNOSIS — Q909 Down syndrome, unspecified: Secondary | ICD-10-CM | POA: Diagnosis not present

## 2018-01-03 DIAGNOSIS — Q909 Down syndrome, unspecified: Secondary | ICD-10-CM | POA: Diagnosis not present

## 2018-01-05 ENCOUNTER — Encounter: Payer: Self-pay | Admitting: Physical Therapy

## 2018-01-05 ENCOUNTER — Ambulatory Visit: Payer: Medicare HMO | Admitting: Physical Therapy

## 2018-01-05 DIAGNOSIS — R269 Unspecified abnormalities of gait and mobility: Secondary | ICD-10-CM

## 2018-01-05 DIAGNOSIS — M6281 Muscle weakness (generalized): Secondary | ICD-10-CM

## 2018-01-05 DIAGNOSIS — G8929 Other chronic pain: Secondary | ICD-10-CM | POA: Diagnosis not present

## 2018-01-05 DIAGNOSIS — Q909 Down syndrome, unspecified: Secondary | ICD-10-CM | POA: Diagnosis not present

## 2018-01-05 DIAGNOSIS — M545 Low back pain: Secondary | ICD-10-CM | POA: Diagnosis not present
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
## 2018-01-06 DIAGNOSIS — Q909 Down syndrome, unspecified: Secondary | ICD-10-CM | POA: Diagnosis not present

## 2018-01-07 ENCOUNTER — Encounter: Payer: Self-pay | Admitting: Physical Therapy

## 2018-01-07 ENCOUNTER — Ambulatory Visit: Payer: Medicare HMO | Admitting: Physical Therapy

## 2018-01-07 DIAGNOSIS — R269 Unspecified abnormalities of gait and mobility: Secondary | ICD-10-CM | POA: Diagnosis not present

## 2018-01-07 DIAGNOSIS — Q909 Down syndrome, unspecified: Secondary | ICD-10-CM | POA: Diagnosis not present

## 2018-01-07 DIAGNOSIS — M6281 Muscle weakness (generalized): Secondary | ICD-10-CM | POA: Diagnosis not present

## 2018-01-07 DIAGNOSIS — M545 Low back pain: Secondary | ICD-10-CM | POA: Diagnosis not present

## 2018-01-07 DIAGNOSIS — G8929 Other chronic pain: Secondary | ICD-10-CM | POA: Diagnosis not present
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
## 2018-01-08 DIAGNOSIS — Q909 Down syndrome, unspecified: Secondary | ICD-10-CM | POA: Diagnosis not present

## 2018-01-09 DIAGNOSIS — Q909 Down syndrome, unspecified: Secondary | ICD-10-CM | POA: Diagnosis not present

## 2018-01-10 DIAGNOSIS — Q909 Down syndrome, unspecified: Secondary | ICD-10-CM | POA: Diagnosis not present

## 2018-01-12 DIAGNOSIS — Q909 Down syndrome, unspecified: Secondary | ICD-10-CM | POA: Diagnosis not present

## 2018-01-13 DIAGNOSIS — Q909 Down syndrome, unspecified: Secondary | ICD-10-CM | POA: Diagnosis not present

## 2018-01-14 DIAGNOSIS — Q909 Down syndrome, unspecified: Secondary | ICD-10-CM | POA: Diagnosis not present

## 2018-01-15 DIAGNOSIS — Q909 Down syndrome, unspecified: Secondary | ICD-10-CM | POA: Diagnosis not present

## 2018-01-16 DIAGNOSIS — Q909 Down syndrome, unspecified: Secondary | ICD-10-CM | POA: Diagnosis not present

## 2018-01-17 DIAGNOSIS — Q909 Down syndrome, unspecified: Secondary | ICD-10-CM | POA: Diagnosis not present

## 2018-01-19 DIAGNOSIS — Q909 Down syndrome, unspecified: Secondary | ICD-10-CM | POA: Diagnosis not present

## 2018-01-20 DIAGNOSIS — Q909 Down syndrome, unspecified: Secondary | ICD-10-CM | POA: Diagnosis not present

## 2018-01-21 DIAGNOSIS — Q909 Down syndrome, unspecified: Secondary | ICD-10-CM | POA: Diagnosis not present

## 2018-01-22 DIAGNOSIS — Q909 Down syndrome, unspecified: Secondary | ICD-10-CM | POA: Diagnosis not present

## 2018-01-23 DIAGNOSIS — Q909 Down syndrome, unspecified: Secondary | ICD-10-CM | POA: Diagnosis not present

## 2018-01-24 DIAGNOSIS — Q909 Down syndrome, unspecified: Secondary | ICD-10-CM | POA: Diagnosis not present

## 2018-01-26 DIAGNOSIS — Q909 Down syndrome, unspecified: Secondary | ICD-10-CM | POA: Diagnosis not present

## 2018-01-27 DIAGNOSIS — Q909 Down syndrome, unspecified: Secondary | ICD-10-CM | POA: Diagnosis not present

## 2018-01-28 DIAGNOSIS — Q909 Down syndrome, unspecified: Secondary | ICD-10-CM | POA: Diagnosis not present

## 2018-01-29 DIAGNOSIS — Q909 Down syndrome, unspecified: Secondary | ICD-10-CM | POA: Diagnosis not present

## 2018-01-30 DIAGNOSIS — G629 Polyneuropathy, unspecified: Secondary | ICD-10-CM | POA: Diagnosis not present

## 2018-01-30 DIAGNOSIS — R270 Ataxia, unspecified: Secondary | ICD-10-CM | POA: Diagnosis not present

## 2018-01-30 DIAGNOSIS — R0683 Snoring: Secondary | ICD-10-CM | POA: Diagnosis not present

## 2018-01-30 DIAGNOSIS — Q909 Down syndrome, unspecified: Secondary | ICD-10-CM | POA: Diagnosis not present

## 2018-01-30 DIAGNOSIS — R5383 Other fatigue: Secondary | ICD-10-CM | POA: Diagnosis not present

## 2018-01-30 DIAGNOSIS — R29818 Other symptoms and signs involving the nervous system: Secondary | ICD-10-CM | POA: Diagnosis not present

## 2018-01-30 DIAGNOSIS — R269 Unspecified abnormalities of gait and mobility: Secondary | ICD-10-CM | POA: Diagnosis not present

## 2018-01-30 DIAGNOSIS — G919 Hydrocephalus, unspecified: Secondary | ICD-10-CM | POA: Diagnosis not present

## 2018-01-30 DIAGNOSIS — R3915 Urgency of urination: Secondary | ICD-10-CM | POA: Diagnosis not present

## 2018-01-30 DIAGNOSIS — R296 Repeated falls: Secondary | ICD-10-CM | POA: Diagnosis not present

## 2018-01-30 DIAGNOSIS — Z9181 History of falling: Secondary | ICD-10-CM | POA: Diagnosis not present

## 2018-01-31 DIAGNOSIS — Q909 Down syndrome, unspecified: Secondary | ICD-10-CM | POA: Diagnosis not present

## 2018-02-02 DIAGNOSIS — Q909 Down syndrome, unspecified: Secondary | ICD-10-CM | POA: Diagnosis not present

## 2018-02-03 DIAGNOSIS — Q909 Down syndrome, unspecified: Secondary | ICD-10-CM | POA: Diagnosis not present

## 2018-02-04 DIAGNOSIS — Q909 Down syndrome, unspecified: Secondary | ICD-10-CM | POA: Diagnosis not present

## 2018-02-05 DIAGNOSIS — Q909 Down syndrome, unspecified: Secondary | ICD-10-CM | POA: Diagnosis not present

## 2018-02-06 DIAGNOSIS — Q909 Down syndrome, unspecified: Secondary | ICD-10-CM | POA: Diagnosis not present

## 2018-02-07 DIAGNOSIS — Q909 Down syndrome, unspecified: Secondary | ICD-10-CM | POA: Diagnosis not present

## 2018-02-09 DIAGNOSIS — Q909 Down syndrome, unspecified: Secondary | ICD-10-CM | POA: Diagnosis not present

## 2018-02-10 DIAGNOSIS — Q909 Down syndrome, unspecified: Secondary | ICD-10-CM | POA: Diagnosis not present

## 2018-02-11 DIAGNOSIS — Q909 Down syndrome, unspecified: Secondary | ICD-10-CM | POA: Diagnosis not present

## 2018-02-12 DIAGNOSIS — Q909 Down syndrome, unspecified: Secondary | ICD-10-CM | POA: Diagnosis not present

## 2018-02-13 DIAGNOSIS — Q909 Down syndrome, unspecified: Secondary | ICD-10-CM | POA: Diagnosis not present

## 2018-02-14 DIAGNOSIS — Q909 Down syndrome, unspecified: Secondary | ICD-10-CM | POA: Diagnosis not present

## 2018-02-16 DIAGNOSIS — Q909 Down syndrome, unspecified: Secondary | ICD-10-CM | POA: Diagnosis not present

## 2018-02-17 DIAGNOSIS — Q909 Down syndrome, unspecified: Secondary | ICD-10-CM | POA: Diagnosis not present

## 2018-02-18 DIAGNOSIS — Q909 Down syndrome, unspecified: Secondary | ICD-10-CM | POA: Diagnosis not present

## 2018-02-19 DIAGNOSIS — Q909 Down syndrome, unspecified: Secondary | ICD-10-CM | POA: Diagnosis not present

## 2018-02-20 DIAGNOSIS — Q909 Down syndrome, unspecified: Secondary | ICD-10-CM | POA: Diagnosis not present

## 2018-02-21 ENCOUNTER — Other Ambulatory Visit: Payer: Self-pay

## 2018-02-21 ENCOUNTER — Encounter: Payer: Self-pay | Admitting: Emergency Medicine

## 2018-02-21 ENCOUNTER — Emergency Department: Payer: Medicare HMO

## 2018-02-21 ENCOUNTER — Emergency Department
Admission: EM | Admit: 2018-02-21 | Discharge: 2018-02-21 | Disposition: A | Discharge status: Home / Self Care | Payer: Medicare HMO | Attending: Emergency Medicine | Admitting: Emergency Medicine

## 2018-02-21 DIAGNOSIS — R531 Weakness: Secondary | ICD-10-CM | POA: Diagnosis not present

## 2018-02-21 DIAGNOSIS — M109 Gout, unspecified: Secondary | ICD-10-CM

## 2018-02-21 DIAGNOSIS — Q909 Down syndrome, unspecified: Secondary | ICD-10-CM | POA: Insufficient documentation

## 2018-02-21 DIAGNOSIS — M79604 Pain in right leg: Secondary | ICD-10-CM | POA: Diagnosis not present

## 2018-02-21 DIAGNOSIS — M549 Dorsalgia, unspecified: Secondary | ICD-10-CM | POA: Insufficient documentation

## 2018-02-21 DIAGNOSIS — R262 Difficulty in walking, not elsewhere classified: Secondary | ICD-10-CM | POA: Diagnosis not present

## 2018-02-21 DIAGNOSIS — M545 Low back pain: Secondary | ICD-10-CM | POA: Diagnosis not present

## 2018-02-21 DIAGNOSIS — M79661 Pain in right lower leg: Secondary | ICD-10-CM | POA: Diagnosis not present

## 2018-02-21 DIAGNOSIS — M79671 Pain in right foot: Secondary | ICD-10-CM | POA: Diagnosis not present

## 2018-02-21 DIAGNOSIS — M25571 Pain in right ankle and joints of right foot: Secondary | ICD-10-CM | POA: Diagnosis not present

## 2018-02-21 LAB — URINALYSIS, ROUTINE W REFLEX MICROSCOPIC
BACTERIA UA: NONE SEEN
Bilirubin Urine: NEGATIVE
Glucose, UA: NEGATIVE mg/dL
Ketones, ur: NEGATIVE mg/dL
NITRITE: NEGATIVE
Protein, ur: NEGATIVE mg/dL
SPECIFIC GRAVITY, URINE: 1.018 (ref 1.005–1.030)
pH: 5 (ref 5.0–8.0)

## 2018-02-21 LAB — CBC
HEMATOCRIT: 34.8 % — AB (ref 40.0–52.0)
HEMOGLOBIN: 11.5 g/dL — AB (ref 13.0–18.0)
MCH: 31.3 pg (ref 26.0–34.0)
MCHC: 33 g/dL (ref 32.0–36.0)
MCV: 94.6 fL (ref 80.0–100.0)
Platelets: 236 10*3/uL (ref 150–440)
RBC: 3.68 MIL/uL — ABNORMAL LOW (ref 4.40–5.90)
RDW: 14.7 % — ABNORMAL HIGH (ref 11.5–14.5)
WBC: 4.5 10*3/uL (ref 3.8–10.6)

## 2018-02-21 LAB — COMPREHENSIVE METABOLIC PANEL
ALBUMIN: 3.2 g/dL — AB (ref 3.5–5.0)
ALT: 13 U/L — ABNORMAL LOW (ref 17–63)
ANION GAP: 6 (ref 5–15)
AST: 23 U/L (ref 15–41)
Alkaline Phosphatase: 47 U/L (ref 38–126)
BILIRUBIN TOTAL: 0.6 mg/dL (ref 0.3–1.2)
BUN: 16 mg/dL (ref 6–20)
CALCIUM: 8.1 mg/dL — AB (ref 8.9–10.3)
CO2: 26 mmol/L (ref 22–32)
Chloride: 104 mmol/L (ref 101–111)
Creatinine, Ser: 1.3 mg/dL — ABNORMAL HIGH (ref 0.61–1.24)
GFR calc Af Amer: >60 mL/min (ref >60)
GLUCOSE: 103 mg/dL — AB (ref 65–99)
Potassium: 3.1 mmol/L — ABNORMAL LOW (ref 3.5–5.1)
Sodium: 136 mmol/L (ref 135–145)
TOTAL PROTEIN: 6.9 g/dL (ref 6.5–8.1)

## 2018-02-21 LAB — URIC ACID: URIC ACID, SERUM: 8.4 mg/dL — AB (ref 4.4–7.6)

## 2018-02-21 LAB — TROPONIN I

## 2018-02-21 LAB — CK: CK TOTAL: 484 U/L — AB (ref 49–397)

## 2018-02-21 MED ORDER — METHYLPREDNISOLONE SODIUM SUCC 125 MG IJ SOLR
125.0000 mg | Freq: Once | INTRAMUSCULAR | Status: AC
  Administered 2018-02-21: 125 mg via INTRAVENOUS
  Filled 2018-02-21: qty 2

## 2018-02-21 MED ORDER — SODIUM CHLORIDE 0.9 % IV BOLUS
1000.0000 mL | Freq: Once | INTRAVENOUS | Status: AC
  Administered 2018-02-21: 1000 mL via INTRAVENOUS

## 2018-02-21 MED ORDER — PREDNISONE 20 MG PO TABS: 40.0000 mg | ORAL_TABLET | Freq: Every day | ORAL | 0 refills | Status: DC
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
## 2018-02-21 NOTE — ED Notes (Signed)
Pt is unable to ambulate even with assist - Dr Kerman Passey notified - family request that pt be transferred to [facility] - Provider notified

## 2018-02-21 NOTE — Discharge Instructions (Addendum)
As we discussed please start your steroids tomorrow.  Please follow-up with your doctor in the next 2 days for recheck/reevaluation.  Return to the emergency department for any other concerning symptoms such as increased weakness, lethargy (extreme fatigue/difficulty awakening), fever, or increased pain.

## 2018-02-23 DIAGNOSIS — Q909 Down syndrome, unspecified: Secondary | ICD-10-CM | POA: Diagnosis not present

## 2018-02-24 DIAGNOSIS — Q909 Down syndrome, unspecified: Secondary | ICD-10-CM | POA: Diagnosis not present

## 2018-02-25 DIAGNOSIS — Q909 Down syndrome, unspecified: Secondary | ICD-10-CM | POA: Diagnosis not present

## 2018-02-26 DIAGNOSIS — Q909 Down syndrome, unspecified: Secondary | ICD-10-CM | POA: Diagnosis not present

## 2018-02-27 DIAGNOSIS — Q909 Down syndrome, unspecified: Secondary | ICD-10-CM | POA: Diagnosis not present

## 2018-02-28 DIAGNOSIS — Q909 Down syndrome, unspecified: Secondary | ICD-10-CM | POA: Diagnosis not present

## 2018-03-02 DIAGNOSIS — Q909 Down syndrome, unspecified: Secondary | ICD-10-CM | POA: Diagnosis not present

## 2018-03-03 DIAGNOSIS — Q909 Down syndrome, unspecified: Secondary | ICD-10-CM | POA: Diagnosis not present

## 2018-03-04 DIAGNOSIS — Q909 Down syndrome, unspecified: Secondary | ICD-10-CM | POA: Diagnosis not present

## 2018-03-05 DIAGNOSIS — Q909 Down syndrome, unspecified: Secondary | ICD-10-CM | POA: Diagnosis not present

## 2018-03-06 DIAGNOSIS — Q909 Down syndrome, unspecified: Secondary | ICD-10-CM | POA: Diagnosis not present

## 2018-03-07 DIAGNOSIS — Q909 Down syndrome, unspecified: Secondary | ICD-10-CM | POA: Diagnosis not present

## 2018-03-09 DIAGNOSIS — Q909 Down syndrome, unspecified: Secondary | ICD-10-CM | POA: Diagnosis not present

## 2018-03-10 DIAGNOSIS — Q909 Down syndrome, unspecified: Secondary | ICD-10-CM | POA: Diagnosis not present

## 2018-03-11 DIAGNOSIS — Q909 Down syndrome, unspecified: Secondary | ICD-10-CM | POA: Diagnosis not present

## 2018-03-12 DIAGNOSIS — Q909 Down syndrome, unspecified: Secondary | ICD-10-CM | POA: Diagnosis not present

## 2018-03-13 DIAGNOSIS — Q909 Down syndrome, unspecified: Secondary | ICD-10-CM | POA: Diagnosis not present

## 2018-03-14 DIAGNOSIS — Q909 Down syndrome, unspecified: Secondary | ICD-10-CM | POA: Diagnosis not present

## 2018-03-15 ENCOUNTER — Emergency Department: Payer: Medicare HMO

## 2018-03-15 ENCOUNTER — Encounter: Payer: Self-pay | Admitting: Emergency Medicine

## 2018-03-15 ENCOUNTER — Other Ambulatory Visit: Payer: Self-pay

## 2018-03-15 ENCOUNTER — Emergency Department
Admission: EM | Admit: 2018-03-15 | Discharge: 2018-03-15 | Disposition: A | Discharge status: Home / Self Care | Payer: Medicare HMO | Attending: Emergency Medicine | Admitting: Emergency Medicine

## 2018-03-15 DIAGNOSIS — Q909 Down syndrome, unspecified: Secondary | ICD-10-CM | POA: Insufficient documentation

## 2018-03-15 DIAGNOSIS — R531 Weakness: Secondary | ICD-10-CM | POA: Insufficient documentation

## 2018-03-15 DIAGNOSIS — G919 Hydrocephalus, unspecified: Secondary | ICD-10-CM | POA: Diagnosis not present

## 2018-03-15 DIAGNOSIS — R0989 Other specified symptoms and signs involving the circulatory and respiratory systems: Secondary | ICD-10-CM | POA: Diagnosis not present

## 2018-03-15 DIAGNOSIS — E860 Dehydration: Secondary | ICD-10-CM | POA: Diagnosis not present

## 2018-03-15 DIAGNOSIS — R4182 Altered mental status, unspecified: Secondary | ICD-10-CM | POA: Diagnosis not present

## 2018-03-15 LAB — CBC WITH DIFFERENTIAL/PLATELET
BASOS ABS: 0.1 10*3/uL (ref 0–0.1)
BASOS PCT: 1 %
Eosinophils Absolute: 0.1 10*3/uL (ref 0–0.7)
Eosinophils Relative: 1 %
HEMATOCRIT: 36 % — AB (ref 40.0–52.0)
Hemoglobin: 12.3 g/dL — ABNORMAL LOW (ref 13.0–18.0)
Lymphocytes Relative: 18 %
Lymphs Abs: 0.9 10*3/uL — ABNORMAL LOW (ref 1.0–3.6)
MCH: 31.7 pg (ref 26.0–34.0)
MCHC: 34.2 g/dL (ref 32.0–36.0)
MCV: 92.8 fL (ref 80.0–100.0)
Monocytes Absolute: 0.6 10*3/uL (ref 0.2–1.0)
Monocytes Relative: 11 %
NEUTROS ABS: 3.5 10*3/uL (ref 1.4–6.5)
NEUTROS PCT: 69 %
PLATELETS: 197 10*3/uL (ref 150–440)
RBC: 3.87 MIL/uL — ABNORMAL LOW (ref 4.40–5.90)
RDW: 14.9 % — AB (ref 11.5–14.5)
WBC: 5.1 10*3/uL (ref 3.8–10.6)

## 2018-03-15 LAB — ETHANOL

## 2018-03-15 LAB — COMPREHENSIVE METABOLIC PANEL
ALBUMIN: 3.3 g/dL — AB (ref 3.5–5.0)
ALT: 13 U/L — ABNORMAL LOW (ref 17–63)
AST: 25 U/L (ref 15–41)
Alkaline Phosphatase: 60 U/L (ref 38–126)
Anion gap: 6 (ref 5–15)
BUN: 14 mg/dL (ref 6–20)
CHLORIDE: 105 mmol/L (ref 101–111)
CO2: 29 mmol/L (ref 22–32)
Calcium: 8.3 mg/dL — ABNORMAL LOW (ref 8.9–10.3)
Creatinine, Ser: 1.55 mg/dL — ABNORMAL HIGH (ref 0.61–1.24)
GFR calc Af Amer: 57 mL/min — ABNORMAL LOW (ref >60)
GFR calc non Af Amer: 49 mL/min — ABNORMAL LOW (ref >60)
GLUCOSE: 107 mg/dL — AB (ref 65–99)
POTASSIUM: 3.7 mmol/L (ref 3.5–5.1)
Sodium: 140 mmol/L (ref 135–145)
Total Bilirubin: 0.6 mg/dL (ref 0.3–1.2)
Total Protein: 6.7 g/dL (ref 6.5–8.1)

## 2018-03-15 LAB — BRAIN NATRIURETIC PEPTIDE: B NATRIURETIC PEPTIDE 5: 7 pg/mL (ref 0.0–100.0)

## 2018-03-15 LAB — PROTIME-INR
INR: 1.09
Prothrombin Time: 14 seconds (ref 11.4–15.2)

## 2018-03-15 LAB — LACTIC ACID, PLASMA: Lactic Acid, Venous: 1.7 mmol/L (ref 0.5–1.9)

## 2018-03-15 MED ORDER — SODIUM CHLORIDE 0.9 % IV BOLUS
1000.0000 mL | Freq: Once | INTRAVENOUS | Status: AC
Start: 2018-03-15 — End: 2018-03-15
  Administered 2018-03-15: 1000 mL via INTRAVENOUS

## 2018-03-15 NOTE — Discharge Instructions (Signed)
Return to the emergency room for any new or worrisome symptoms including lethargy, increased falls, seizures, fever, vomiting, diarrhea, or other concerns.
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
## 2018-03-15 NOTE — ED Triage Notes (Signed)
Pt presents to ED via [facility] with c/o AMS, pt from home, lives with his sister who is POA, pt with hx of Down Syndrome. Per EMS pt's sister reports at baseline pt is normally walking and talking, for last 2-3 days pt has not wanted to walk, talk, or eat. EMS reports fall yesterday and pt did hit his head, no obvious injury, pt was not evaluated for the fall.

## 2018-03-15 NOTE — ED Notes (Signed)
Attempted to perform in and out cath, pt did not tolerate well, began screaming, pt became too tense to pass the catheter. MD made aware unable to tolerate cath.

## 2018-03-15 NOTE — ED Notes (Signed)
Pt unable to void at this time. MD McShane aware and pt will be DC home.

## 2018-03-16 DIAGNOSIS — Q909 Down syndrome, unspecified: Secondary | ICD-10-CM | POA: Diagnosis not present

## 2018-03-17 DIAGNOSIS — Q909 Down syndrome, unspecified: Secondary | ICD-10-CM | POA: Diagnosis not present

## 2018-03-18 DIAGNOSIS — Q909 Down syndrome, unspecified: Secondary | ICD-10-CM | POA: Diagnosis not present

## 2018-03-19 DIAGNOSIS — Q909 Down syndrome, unspecified: Secondary | ICD-10-CM | POA: Diagnosis not present

## 2018-03-20 DIAGNOSIS — Q909 Down syndrome, unspecified: Secondary | ICD-10-CM | POA: Diagnosis not present

## 2018-03-21 DIAGNOSIS — Q909 Down syndrome, unspecified: Secondary | ICD-10-CM | POA: Diagnosis not present

## 2018-03-23 DIAGNOSIS — Q909 Down syndrome, unspecified: Secondary | ICD-10-CM | POA: Diagnosis not present

## 2018-03-24 DIAGNOSIS — Q909 Down syndrome, unspecified: Secondary | ICD-10-CM | POA: Diagnosis not present

## 2018-03-25 DIAGNOSIS — Q909 Down syndrome, unspecified: Secondary | ICD-10-CM | POA: Diagnosis not present

## 2018-03-26 DIAGNOSIS — Q909 Down syndrome, unspecified: Secondary | ICD-10-CM | POA: Diagnosis not present

## 2018-03-27 DIAGNOSIS — Q909 Down syndrome, unspecified: Secondary | ICD-10-CM | POA: Diagnosis not present

## 2018-03-28 DIAGNOSIS — Q909 Down syndrome, unspecified: Secondary | ICD-10-CM | POA: Diagnosis not present

## 2018-03-30 DIAGNOSIS — Q909 Down syndrome, unspecified: Secondary | ICD-10-CM | POA: Diagnosis not present

## 2018-03-31 DIAGNOSIS — Q909 Down syndrome, unspecified: Secondary | ICD-10-CM | POA: Diagnosis not present

## 2018-04-01 DIAGNOSIS — Q909 Down syndrome, unspecified: Secondary | ICD-10-CM | POA: Diagnosis not present

## 2018-04-02 DIAGNOSIS — Q909 Down syndrome, unspecified: Secondary | ICD-10-CM | POA: Diagnosis not present

## 2018-04-03 DIAGNOSIS — N183 Chronic kidney disease, stage 3 (moderate): Secondary | ICD-10-CM | POA: Diagnosis not present

## 2018-04-03 DIAGNOSIS — R4189 Other symptoms and signs involving cognitive functions and awareness: Secondary | ICD-10-CM | POA: Diagnosis not present

## 2018-04-03 DIAGNOSIS — Q909 Down syndrome, unspecified: Secondary | ICD-10-CM | POA: Diagnosis not present

## 2018-04-03 DIAGNOSIS — R4689 Other symptoms and signs involving appearance and behavior: Secondary | ICD-10-CM | POA: Diagnosis not present

## 2018-04-03 DIAGNOSIS — E785 Hyperlipidemia, unspecified: Secondary | ICD-10-CM | POA: Diagnosis not present

## 2018-04-04 DIAGNOSIS — Q909 Down syndrome, unspecified: Secondary | ICD-10-CM | POA: Diagnosis not present

## 2018-04-06 DIAGNOSIS — Q909 Down syndrome, unspecified: Secondary | ICD-10-CM | POA: Diagnosis not present

## 2018-04-07 DIAGNOSIS — Q909 Down syndrome, unspecified: Secondary | ICD-10-CM | POA: Diagnosis not present

## 2018-04-08 DIAGNOSIS — Q909 Down syndrome, unspecified: Secondary | ICD-10-CM | POA: Diagnosis not present

## 2018-04-09 DIAGNOSIS — Q909 Down syndrome, unspecified: Secondary | ICD-10-CM | POA: Diagnosis not present

## 2018-04-10 DIAGNOSIS — G919 Hydrocephalus, unspecified: Secondary | ICD-10-CM | POA: Diagnosis not present

## 2018-04-10 DIAGNOSIS — M48061 Spinal stenosis, lumbar region without neurogenic claudication: Secondary | ICD-10-CM | POA: Diagnosis not present

## 2018-04-10 DIAGNOSIS — G129 Spinal muscular atrophy, unspecified: Secondary | ICD-10-CM | POA: Diagnosis not present

## 2018-04-10 DIAGNOSIS — Q909 Down syndrome, unspecified: Secondary | ICD-10-CM | POA: Diagnosis not present

## 2018-04-10 DIAGNOSIS — G9589 Other specified diseases of spinal cord: Secondary | ICD-10-CM | POA: Diagnosis not present

## 2018-04-10 DIAGNOSIS — E854 Organ-limited amyloidosis: Secondary | ICD-10-CM | POA: Diagnosis not present

## 2018-04-10 DIAGNOSIS — R270 Ataxia, unspecified: Secondary | ICD-10-CM | POA: Diagnosis not present

## 2018-04-10 DIAGNOSIS — R296 Repeated falls: Secondary | ICD-10-CM | POA: Diagnosis not present

## 2018-04-10 DIAGNOSIS — R2689 Other abnormalities of gait and mobility: Secondary | ICD-10-CM | POA: Diagnosis not present

## 2018-04-10 DIAGNOSIS — M47812 Spondylosis without myelopathy or radiculopathy, cervical region: Secondary | ICD-10-CM | POA: Diagnosis not present

## 2018-04-11 DIAGNOSIS — Q909 Down syndrome, unspecified: Secondary | ICD-10-CM | POA: Diagnosis not present

## 2018-04-13 DIAGNOSIS — Q909 Down syndrome, unspecified: Secondary | ICD-10-CM | POA: Diagnosis not present

## 2018-04-14 DIAGNOSIS — Q909 Down syndrome, unspecified: Secondary | ICD-10-CM | POA: Diagnosis not present

## 2018-04-15 DIAGNOSIS — Q909 Down syndrome, unspecified: Secondary | ICD-10-CM | POA: Diagnosis not present

## 2018-04-16 DIAGNOSIS — Q909 Down syndrome, unspecified: Secondary | ICD-10-CM | POA: Diagnosis not present

## 2018-04-17 DIAGNOSIS — Q909 Down syndrome, unspecified: Secondary | ICD-10-CM | POA: Diagnosis not present

## 2018-04-18 DIAGNOSIS — Q909 Down syndrome, unspecified: Secondary | ICD-10-CM | POA: Diagnosis not present

## 2018-04-20 DIAGNOSIS — Q909 Down syndrome, unspecified: Secondary | ICD-10-CM | POA: Diagnosis not present

## 2018-04-21 DIAGNOSIS — Q909 Down syndrome, unspecified: Secondary | ICD-10-CM | POA: Diagnosis not present

## 2018-04-22 DIAGNOSIS — Q909 Down syndrome, unspecified: Secondary | ICD-10-CM | POA: Diagnosis not present

## 2018-04-23 DIAGNOSIS — Q909 Down syndrome, unspecified: Secondary | ICD-10-CM | POA: Diagnosis not present

## 2018-04-24 DIAGNOSIS — Q909 Down syndrome, unspecified: Secondary | ICD-10-CM | POA: Diagnosis not present

## 2018-04-25 DIAGNOSIS — Q909 Down syndrome, unspecified: Secondary | ICD-10-CM | POA: Diagnosis not present

## 2018-04-27 DIAGNOSIS — Q909 Down syndrome, unspecified: Secondary | ICD-10-CM | POA: Diagnosis not present

## 2018-04-28 DIAGNOSIS — Q909 Down syndrome, unspecified: Secondary | ICD-10-CM | POA: Diagnosis not present

## 2018-04-29 DIAGNOSIS — R296 Repeated falls: Secondary | ICD-10-CM | POA: Diagnosis not present

## 2018-04-29 DIAGNOSIS — Z8679 Personal history of other diseases of the circulatory system: Secondary | ICD-10-CM | POA: Diagnosis not present

## 2018-04-29 DIAGNOSIS — G9589 Other specified diseases of spinal cord: Secondary | ICD-10-CM | POA: Diagnosis not present

## 2018-04-29 DIAGNOSIS — S14109S Unspecified injury at unspecified level of cervical spinal cord, sequela: Secondary | ICD-10-CM | POA: Diagnosis not present

## 2018-04-29 DIAGNOSIS — J634 Siderosis: Secondary | ICD-10-CM | POA: Diagnosis not present

## 2018-04-29 DIAGNOSIS — R269 Unspecified abnormalities of gait and mobility: Secondary | ICD-10-CM | POA: Diagnosis not present

## 2018-04-29 DIAGNOSIS — G309 Alzheimer's disease, unspecified: Secondary | ICD-10-CM | POA: Diagnosis not present

## 2018-04-29 DIAGNOSIS — Q909 Down syndrome, unspecified: Secondary | ICD-10-CM | POA: Diagnosis not present

## 2018-04-29 DIAGNOSIS — F0281 Dementia in other diseases classified elsewhere with behavioral disturbance: Secondary | ICD-10-CM | POA: Diagnosis not present

## 2018-04-29 DIAGNOSIS — R5383 Other fatigue: Secondary | ICD-10-CM | POA: Diagnosis not present

## 2018-04-30 DIAGNOSIS — Q909 Down syndrome, unspecified: Secondary | ICD-10-CM | POA: Diagnosis not present

## 2018-05-01 DIAGNOSIS — Q909 Down syndrome, unspecified: Secondary | ICD-10-CM | POA: Diagnosis not present

## 2018-05-02 DIAGNOSIS — Q909 Down syndrome, unspecified: Secondary | ICD-10-CM | POA: Diagnosis not present

## 2018-05-04 DIAGNOSIS — Q909 Down syndrome, unspecified: Secondary | ICD-10-CM | POA: Diagnosis not present

## 2018-05-05 DIAGNOSIS — Q909 Down syndrome, unspecified: Secondary | ICD-10-CM | POA: Diagnosis not present

## 2018-05-06 DIAGNOSIS — Q909 Down syndrome, unspecified: Secondary | ICD-10-CM | POA: Diagnosis not present

## 2018-05-07 DIAGNOSIS — Q909 Down syndrome, unspecified: Secondary | ICD-10-CM | POA: Diagnosis not present

## 2018-05-07 DIAGNOSIS — S14103D Unspecified injury at C3 level of cervical spinal cord, subsequent encounter: Secondary | ICD-10-CM | POA: Diagnosis not present

## 2018-05-07 DIAGNOSIS — G309 Alzheimer's disease, unspecified: Secondary | ICD-10-CM | POA: Diagnosis not present

## 2018-05-07 DIAGNOSIS — G9589 Other specified diseases of spinal cord: Secondary | ICD-10-CM | POA: Diagnosis not present

## 2018-05-07 DIAGNOSIS — F0281 Dementia in other diseases classified elsewhere with behavioral disturbance: Secondary | ICD-10-CM | POA: Diagnosis not present

## 2018-05-07 DIAGNOSIS — S06360D Traumatic hemorrhage of cerebrum, unspecified, without loss of consciousness, subsequent encounter: Secondary | ICD-10-CM | POA: Diagnosis not present

## 2018-05-08 DIAGNOSIS — S06360D Traumatic hemorrhage of cerebrum, unspecified, without loss of consciousness, subsequent encounter: Secondary | ICD-10-CM | POA: Diagnosis not present

## 2018-05-08 DIAGNOSIS — G9589 Other specified diseases of spinal cord: Secondary | ICD-10-CM | POA: Diagnosis not present

## 2018-05-08 DIAGNOSIS — F0281 Dementia in other diseases classified elsewhere with behavioral disturbance: Secondary | ICD-10-CM | POA: Diagnosis not present

## 2018-05-08 DIAGNOSIS — S14103D Unspecified injury at C3 level of cervical spinal cord, subsequent encounter: Secondary | ICD-10-CM | POA: Diagnosis not present

## 2018-05-08 DIAGNOSIS — G309 Alzheimer's disease, unspecified: Secondary | ICD-10-CM | POA: Diagnosis not present

## 2018-05-08 DIAGNOSIS — Q909 Down syndrome, unspecified: Secondary | ICD-10-CM | POA: Diagnosis not present

## 2018-05-09 DIAGNOSIS — Q909 Down syndrome, unspecified: Secondary | ICD-10-CM | POA: Diagnosis not present

## 2018-05-11 DIAGNOSIS — Q909 Down syndrome, unspecified: Secondary | ICD-10-CM | POA: Diagnosis not present

## 2018-05-12 DIAGNOSIS — G309 Alzheimer's disease, unspecified: Secondary | ICD-10-CM | POA: Diagnosis not present

## 2018-05-12 DIAGNOSIS — F0281 Dementia in other diseases classified elsewhere with behavioral disturbance: Secondary | ICD-10-CM | POA: Diagnosis not present

## 2018-05-12 DIAGNOSIS — S06360D Traumatic hemorrhage of cerebrum, unspecified, without loss of consciousness, subsequent encounter: Secondary | ICD-10-CM | POA: Diagnosis not present

## 2018-05-12 DIAGNOSIS — Q909 Down syndrome, unspecified: Secondary | ICD-10-CM | POA: Diagnosis not present

## 2018-05-12 DIAGNOSIS — G9589 Other specified diseases of spinal cord: Secondary | ICD-10-CM | POA: Diagnosis not present

## 2018-05-12 DIAGNOSIS — S14103D Unspecified injury at C3 level of cervical spinal cord, subsequent encounter: Secondary | ICD-10-CM | POA: Diagnosis not present

## 2018-05-13 DIAGNOSIS — Q909 Down syndrome, unspecified: Secondary | ICD-10-CM | POA: Diagnosis not present

## 2018-05-14 DIAGNOSIS — Q909 Down syndrome, unspecified: Secondary | ICD-10-CM | POA: Diagnosis not present

## 2018-05-15 DIAGNOSIS — G9589 Other specified diseases of spinal cord: Secondary | ICD-10-CM | POA: Diagnosis not present

## 2018-05-15 DIAGNOSIS — Q909 Down syndrome, unspecified: Secondary | ICD-10-CM | POA: Diagnosis not present

## 2018-05-15 DIAGNOSIS — S06360D Traumatic hemorrhage of cerebrum, unspecified, without loss of consciousness, subsequent encounter: Secondary | ICD-10-CM | POA: Diagnosis not present

## 2018-05-15 DIAGNOSIS — G309 Alzheimer's disease, unspecified: Secondary | ICD-10-CM | POA: Diagnosis not present

## 2018-05-15 DIAGNOSIS — S14103D Unspecified injury at C3 level of cervical spinal cord, subsequent encounter: Secondary | ICD-10-CM | POA: Diagnosis not present

## 2018-05-15 DIAGNOSIS — F0281 Dementia in other diseases classified elsewhere with behavioral disturbance: Secondary | ICD-10-CM | POA: Diagnosis not present

## 2018-05-16 DIAGNOSIS — Q909 Down syndrome, unspecified: Secondary | ICD-10-CM | POA: Diagnosis not present

## 2018-05-17 ENCOUNTER — Emergency Department
Admission: EM | Admit: 2018-05-17 | Discharge: 2018-05-17 | Disposition: A | Discharge status: Home / Self Care | Payer: Medicare HMO | Attending: Emergency Medicine | Admitting: Emergency Medicine

## 2018-05-17 ENCOUNTER — Other Ambulatory Visit: Payer: Self-pay

## 2018-05-17 ENCOUNTER — Emergency Department: Payer: Medicare HMO

## 2018-05-17 DIAGNOSIS — R339 Retention of urine, unspecified: Secondary | ICD-10-CM | POA: Diagnosis not present

## 2018-05-17 DIAGNOSIS — M25572 Pain in left ankle and joints of left foot: Secondary | ICD-10-CM | POA: Diagnosis not present

## 2018-05-17 DIAGNOSIS — N390 Urinary tract infection, site not specified: Secondary | ICD-10-CM

## 2018-05-17 DIAGNOSIS — Z79899 Other long term (current) drug therapy: Secondary | ICD-10-CM | POA: Insufficient documentation

## 2018-05-17 DIAGNOSIS — M25551 Pain in right hip: Secondary | ICD-10-CM | POA: Diagnosis not present

## 2018-05-17 DIAGNOSIS — R4182 Altered mental status, unspecified: Secondary | ICD-10-CM | POA: Diagnosis not present

## 2018-05-17 DIAGNOSIS — Z743 Need for continuous supervision: Secondary | ICD-10-CM | POA: Diagnosis not present

## 2018-05-17 DIAGNOSIS — R34 Anuria and oliguria: Secondary | ICD-10-CM | POA: Diagnosis present

## 2018-05-17 DIAGNOSIS — Q909 Down syndrome, unspecified: Secondary | ICD-10-CM | POA: Diagnosis not present

## 2018-05-17 LAB — BASIC METABOLIC PANEL
ANION GAP: 10 (ref 5–15)
BUN: 18 mg/dL (ref 6–20)
CALCIUM: 8.5 mg/dL — AB (ref 8.9–10.3)
CO2: 25 mmol/L (ref 22–32)
CREATININE: 1.47 mg/dL — AB (ref 0.61–1.24)
Chloride: 108 mmol/L (ref 98–111)
GFR, EST NON AFRICAN AMERICAN: 52 mL/min — AB (ref >60)
GLUCOSE: 100 mg/dL — AB (ref 70–99)
Potassium: 3.6 mmol/L (ref 3.5–5.1)
Sodium: 143 mmol/L (ref 135–145)

## 2018-05-17 LAB — URINALYSIS, COMPLETE (UACMP) WITH MICROSCOPIC
BILIRUBIN URINE: NEGATIVE
Bacteria, UA: NONE SEEN
GLUCOSE, UA: NEGATIVE mg/dL
Hgb urine dipstick: NEGATIVE
KETONES UR: NEGATIVE mg/dL
Nitrite: NEGATIVE
PH: 5 (ref 5.0–8.0)
PROTEIN: NEGATIVE mg/dL
Specific Gravity, Urine: 1.02 (ref 1.005–1.030)

## 2018-05-17 LAB — CBC
HCT: 35.4 % — ABNORMAL LOW (ref 40.0–52.0)
Hemoglobin: 11.6 g/dL — ABNORMAL LOW (ref 13.0–18.0)
MCH: 30.5 pg (ref 26.0–34.0)
MCHC: 32.8 g/dL (ref 32.0–36.0)
MCV: 92.9 fL (ref 80.0–100.0)
Platelets: 239 10*3/uL (ref 150–440)
RBC: 3.81 MIL/uL — ABNORMAL LOW (ref 4.40–5.90)
RDW: 14.7 % — ABNORMAL HIGH (ref 11.5–14.5)
WBC: 5.6 10*3/uL (ref 3.8–10.6)

## 2018-05-17 MED ORDER — CEPHALEXIN 500 MG PO CAPS: 500.0000 mg | ORAL_CAPSULE | Freq: Two times a day (BID) | ORAL | 0 refills | Status: AC

## 2018-05-17 MED ORDER — SODIUM CHLORIDE 0.9 % IV BOLUS
500.0000 mL | Freq: Once | INTRAVENOUS | Status: AC
  Administered 2018-05-17: 500 mL via INTRAVENOUS

## 2018-05-17 MED ORDER — CEFTRIAXONE SODIUM 1 G IJ SOLR
1.0000 g | Freq: Once | INTRAMUSCULAR | Status: AC
  Administered 2018-05-17: 1 g via INTRAVENOUS
  Filled 2018-05-17: qty 10

## 2018-05-17 NOTE — ED Triage Notes (Signed)
Pt arrives from home via [facility]. EMS reports Pt caregiver states oligura x 1 day. Pt is a poor historian d/t advanced dementia.  Upon arrival, pt abdomen is distended and pt unable to void. In and out cath complete. UA ordered.

## 2018-05-17 NOTE — Discharge Instructions (Signed)
Take the antibiotic as prescribed and finish the full course.  Follow-up with the urologist in 2 weeks to make sure that the infection is cleared up and to remove the catheter.  Return to the ER for new or worsening pain, clogging of the Foley, fevers, vomiting or inability to take the medication, or any other new or worsening symptoms that concern you.
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
## 2018-05-18 DIAGNOSIS — Q909 Down syndrome, unspecified: Secondary | ICD-10-CM | POA: Diagnosis not present

## 2018-05-19 DIAGNOSIS — Q909 Down syndrome, unspecified: Secondary | ICD-10-CM | POA: Diagnosis not present

## 2018-05-20 ENCOUNTER — Emergency Department: Payer: Medicare HMO

## 2018-05-20 ENCOUNTER — Emergency Department
Admission: EM | Admit: 2018-05-20 | Discharge: 2018-05-20 | Disposition: A | Discharge status: Home / Self Care | Payer: Medicare HMO | Attending: Emergency Medicine | Admitting: Emergency Medicine

## 2018-05-20 DIAGNOSIS — R55 Syncope and collapse: Secondary | ICD-10-CM | POA: Diagnosis not present

## 2018-05-20 DIAGNOSIS — Z79899 Other long term (current) drug therapy: Secondary | ICD-10-CM | POA: Insufficient documentation

## 2018-05-20 DIAGNOSIS — S299XXA Unspecified injury of thorax, initial encounter: Secondary | ICD-10-CM | POA: Diagnosis not present

## 2018-05-20 DIAGNOSIS — S0083XA Contusion of other part of head, initial encounter: Secondary | ICD-10-CM | POA: Diagnosis not present

## 2018-05-20 DIAGNOSIS — Q909 Down syndrome, unspecified: Secondary | ICD-10-CM | POA: Insufficient documentation

## 2018-05-20 DIAGNOSIS — S199XXA Unspecified injury of neck, initial encounter: Secondary | ICD-10-CM | POA: Diagnosis not present

## 2018-05-20 DIAGNOSIS — Z7401 Bed confinement status: Secondary | ICD-10-CM | POA: Diagnosis not present

## 2018-05-20 DIAGNOSIS — S0990XA Unspecified injury of head, initial encounter: Secondary | ICD-10-CM | POA: Diagnosis not present

## 2018-05-20 DIAGNOSIS — R4182 Altered mental status, unspecified: Secondary | ICD-10-CM | POA: Diagnosis not present

## 2018-05-20 LAB — COMPREHENSIVE METABOLIC PANEL
ALBUMIN: 3.4 g/dL — AB (ref 3.5–5.0)
ALK PHOS: 55 U/L (ref 38–126)
ALT: 33 U/L (ref 0–44)
AST: 108 U/L — ABNORMAL HIGH (ref 15–41)
Anion gap: 11 (ref 5–15)
BILIRUBIN TOTAL: 0.9 mg/dL (ref 0.3–1.2)
BUN: 23 mg/dL — AB (ref 6–20)
CO2: 27 mmol/L (ref 22–32)
CREATININE: 1.78 mg/dL — AB (ref 0.61–1.24)
Calcium: 8.7 mg/dL — ABNORMAL LOW (ref 8.9–10.3)
Chloride: 104 mmol/L (ref 98–111)
GFR calc non Af Amer: 41 mL/min — ABNORMAL LOW (ref >60)
GFR, EST AFRICAN AMERICAN: 48 mL/min — AB (ref >60)
Glucose, Bld: 157 mg/dL — ABNORMAL HIGH (ref 70–99)
Potassium: 3.6 mmol/L (ref 3.5–5.1)
SODIUM: 142 mmol/L (ref 135–145)
TOTAL PROTEIN: 8 g/dL (ref 6.5–8.1)

## 2018-05-20 LAB — CBC WITH DIFFERENTIAL/PLATELET
BASOS PCT: 0 %
Basophils Absolute: 0 10*3/uL (ref 0–0.1)
EOS ABS: 0 10*3/uL (ref 0–0.7)
Eosinophils Relative: 0 %
HCT: 36.3 % — ABNORMAL LOW (ref 40.0–52.0)
HEMOGLOBIN: 12.3 g/dL — AB (ref 13.0–18.0)
Lymphocytes Relative: 6 %
Lymphs Abs: 0.7 10*3/uL — ABNORMAL LOW (ref 1.0–3.6)
MCH: 30.9 pg (ref 26.0–34.0)
MCHC: 33.9 g/dL (ref 32.0–36.0)
MCV: 91.1 fL (ref 80.0–100.0)
MONOS PCT: 10 %
Monocytes Absolute: 1.3 10*3/uL — ABNORMAL HIGH (ref 0.2–1.0)
NEUTROS PCT: 84 %
Neutro Abs: 10.6 10*3/uL — ABNORMAL HIGH (ref 1.4–6.5)
Platelets: 212 10*3/uL (ref 150–440)
RBC: 3.98 MIL/uL — ABNORMAL LOW (ref 4.40–5.90)
RDW: 14.8 % — ABNORMAL HIGH (ref 11.5–14.5)
WBC: 12.7 10*3/uL — AB (ref 3.8–10.6)

## 2018-05-20 LAB — URINALYSIS, COMPLETE (UACMP) WITH MICROSCOPIC
BILIRUBIN URINE: NEGATIVE
Glucose, UA: NEGATIVE mg/dL
KETONES UR: NEGATIVE mg/dL
Leukocytes, UA: NEGATIVE
Nitrite: NEGATIVE
PH: 5 (ref 5.0–8.0)
Protein, ur: 30 mg/dL — AB
SQUAMOUS EPITHELIAL / LPF: NONE SEEN (ref 0–5)
Specific Gravity, Urine: 1.027 (ref 1.005–1.030)

## 2018-05-20 LAB — TROPONIN I

## 2018-05-20 MED ORDER — SODIUM CHLORIDE 0.9 % IV BOLUS
1000.0000 mL | Freq: Once | INTRAVENOUS | Status: AC
  Administered 2018-05-20: 1000 mL via INTRAVENOUS

## 2018-05-20 NOTE — ED Triage Notes (Signed)
Witnessed fall from standing while at home with parents. Pt became weak and eyes rolled back in head and he passed out. +abrasion to forehead. Hx downs syndrome. Denies pain

## 2018-05-20 NOTE — ED Notes (Signed)
Verbal order from MD to remove foley catheter. Advised pt/family to return if he does not urinate for 6 hours.

## 2018-05-20 NOTE — ED Notes (Signed)
RNx2 attempted to dress pt and get him to wheelchair. Pt not able to bear any weight. Pt sister and caregiver is at bedside and they request EMS transport home. Pt sister to contact pt PCP to set up possibly getting pt into nursing faciliy for more extensive care/therapy. D/c instructions reviewed and all questions answered.

## 2018-05-20 NOTE — ED Notes (Signed)
Pt family at bedside updated with plan of care and requests that foley cath be removed because "it is hurting him so much." RN explained that MD Schaevitz will make that decision and will be in to update them.
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
## 2018-05-20 NOTE — ED Notes (Signed)
Pt sleeping on stretcher but is arousable to voice. Syncopal episode at home while taking a bath. +abrasion to right forehead. Pt denies any complaints. Pt was here 3 days ago for "fluid on the brain" per EMS. No family at bedside yet. VSS.

## 2018-05-21 DIAGNOSIS — Q909 Down syndrome, unspecified: Secondary | ICD-10-CM | POA: Diagnosis not present

## 2018-05-22 DIAGNOSIS — Q909 Down syndrome, unspecified: Secondary | ICD-10-CM | POA: Diagnosis not present

## 2018-05-22 LAB — URINE CULTURE: Culture: NO GROWTH

## 2018-05-23 DIAGNOSIS — Q909 Down syndrome, unspecified: Secondary | ICD-10-CM | POA: Diagnosis not present

## 2018-05-25 DIAGNOSIS — Q909 Down syndrome, unspecified: Secondary | ICD-10-CM | POA: Diagnosis not present

## 2018-05-26 DIAGNOSIS — Q909 Down syndrome, unspecified: Secondary | ICD-10-CM | POA: Diagnosis not present

## 2018-05-27 DIAGNOSIS — M1712 Unilateral primary osteoarthritis, left knee: Secondary | ICD-10-CM | POA: Diagnosis not present

## 2018-05-27 DIAGNOSIS — R6521 Severe sepsis with septic shock: Secondary | ICD-10-CM | POA: Diagnosis not present

## 2018-05-27 DIAGNOSIS — F05 Delirium due to known physiological condition: Secondary | ICD-10-CM | POA: Diagnosis not present

## 2018-05-27 DIAGNOSIS — R4189 Other symptoms and signs involving cognitive functions and awareness: Secondary | ICD-10-CM | POA: Diagnosis not present

## 2018-05-27 DIAGNOSIS — R509 Fever, unspecified: Secondary | ICD-10-CM | POA: Diagnosis not present

## 2018-05-27 DIAGNOSIS — I82431 Acute embolism and thrombosis of right popliteal vein: Secondary | ICD-10-CM | POA: Diagnosis not present

## 2018-05-27 DIAGNOSIS — Q909 Down syndrome, unspecified: Secondary | ICD-10-CM | POA: Diagnosis not present

## 2018-05-27 DIAGNOSIS — R296 Repeated falls: Secondary | ICD-10-CM | POA: Diagnosis not present

## 2018-05-27 DIAGNOSIS — M25572 Pain in left ankle and joints of left foot: Secondary | ICD-10-CM | POA: Diagnosis not present

## 2018-05-27 DIAGNOSIS — A419 Sepsis, unspecified organism: Secondary | ICD-10-CM | POA: Diagnosis not present

## 2018-05-27 DIAGNOSIS — M6281 Muscle weakness (generalized): Secondary | ICD-10-CM | POA: Diagnosis not present

## 2018-05-27 DIAGNOSIS — I82411 Acute embolism and thrombosis of right femoral vein: Secondary | ICD-10-CM | POA: Diagnosis not present

## 2018-05-27 DIAGNOSIS — R930 Abnormal findings on diagnostic imaging of skull and head, not elsewhere classified: Secondary | ICD-10-CM | POA: Diagnosis not present

## 2018-05-27 DIAGNOSIS — R4182 Altered mental status, unspecified: Secondary | ICD-10-CM | POA: Diagnosis not present

## 2018-05-27 DIAGNOSIS — R279 Unspecified lack of coordination: Secondary | ICD-10-CM | POA: Diagnosis not present

## 2018-05-27 DIAGNOSIS — R0989 Other specified symptoms and signs involving the circulatory and respiratory systems: Secondary | ICD-10-CM | POA: Diagnosis not present

## 2018-05-27 DIAGNOSIS — I340 Nonrheumatic mitral (valve) insufficiency: Secondary | ICD-10-CM | POA: Diagnosis not present

## 2018-05-27 DIAGNOSIS — I824Y1 Acute embolism and thrombosis of unspecified deep veins of right proximal lower extremity: Secondary | ICD-10-CM | POA: Diagnosis not present

## 2018-05-27 DIAGNOSIS — N179 Acute kidney failure, unspecified: Secondary | ICD-10-CM | POA: Diagnosis not present

## 2018-05-27 DIAGNOSIS — M25552 Pain in left hip: Secondary | ICD-10-CM | POA: Diagnosis not present

## 2018-05-27 DIAGNOSIS — I82441 Acute embolism and thrombosis of right tibial vein: Secondary | ICD-10-CM | POA: Diagnosis not present

## 2018-05-27 DIAGNOSIS — R4689 Other symptoms and signs involving appearance and behavior: Secondary | ICD-10-CM | POA: Diagnosis not present

## 2018-05-27 DIAGNOSIS — R109 Unspecified abdominal pain: Secondary | ICD-10-CM | POA: Diagnosis not present

## 2018-05-27 DIAGNOSIS — M79672 Pain in left foot: Secondary | ICD-10-CM | POA: Diagnosis not present

## 2018-05-27 DIAGNOSIS — I959 Hypotension, unspecified: Secondary | ICD-10-CM | POA: Diagnosis not present

## 2018-05-27 DIAGNOSIS — I82401 Acute embolism and thrombosis of unspecified deep veins of right lower extremity: Secondary | ICD-10-CM | POA: Diagnosis not present

## 2018-05-27 DIAGNOSIS — E870 Hyperosmolality and hypernatremia: Secondary | ICD-10-CM | POA: Diagnosis not present

## 2018-05-27 DIAGNOSIS — R000 Tachycardia, unspecified: Secondary | ICD-10-CM | POA: Diagnosis not present

## 2018-05-27 DIAGNOSIS — S7002XA Contusion of left hip, initial encounter: Secondary | ICD-10-CM | POA: Diagnosis not present

## 2018-05-27 DIAGNOSIS — A4189 Other specified sepsis: Secondary | ICD-10-CM | POA: Diagnosis not present

## 2018-05-27 DIAGNOSIS — R339 Retention of urine, unspecified: Secondary | ICD-10-CM | POA: Diagnosis not present

## 2018-05-27 DIAGNOSIS — R569 Unspecified convulsions: Secondary | ICD-10-CM | POA: Diagnosis not present

## 2018-05-27 DIAGNOSIS — Z7409 Other reduced mobility: Secondary | ICD-10-CM | POA: Diagnosis not present

## 2018-05-27 DIAGNOSIS — I824Z1 Acute embolism and thrombosis of unspecified deep veins of right distal lower extremity: Secondary | ICD-10-CM | POA: Diagnosis not present

## 2018-05-27 DIAGNOSIS — R918 Other nonspecific abnormal finding of lung field: Secondary | ICD-10-CM | POA: Diagnosis not present

## 2018-05-27 DIAGNOSIS — N183 Chronic kidney disease, stage 3 (moderate): Secondary | ICD-10-CM | POA: Diagnosis not present

## 2018-05-27 DIAGNOSIS — D649 Anemia, unspecified: Secondary | ICD-10-CM | POA: Diagnosis not present

## 2018-05-27 DIAGNOSIS — R5381 Other malaise: Secondary | ICD-10-CM | POA: Diagnosis not present

## 2018-05-27 DIAGNOSIS — N189 Chronic kidney disease, unspecified: Secondary | ICD-10-CM | POA: Diagnosis not present

## 2018-05-27 DIAGNOSIS — E785 Hyperlipidemia, unspecified: Secondary | ICD-10-CM | POA: Diagnosis not present

## 2018-05-27 DIAGNOSIS — Z86718 Personal history of other venous thrombosis and embolism: Secondary | ICD-10-CM | POA: Diagnosis not present

## 2018-05-27 DIAGNOSIS — G919 Hydrocephalus, unspecified: Secondary | ICD-10-CM | POA: Diagnosis not present

## 2018-05-29 DIAGNOSIS — R569 Unspecified convulsions: Secondary | ICD-10-CM | POA: Diagnosis not present

## 2018-06-03 ENCOUNTER — Encounter: Payer: Self-pay | Admitting: Urology

## 2018-06-03 ENCOUNTER — Ambulatory Visit: Payer: Medicare HMO | Admitting: Urology

## 2018-06-24 DIAGNOSIS — D649 Anemia, unspecified: Secondary | ICD-10-CM | POA: Diagnosis not present

## 2018-06-24 DIAGNOSIS — R296 Repeated falls: Secondary | ICD-10-CM | POA: Diagnosis not present

## 2018-06-24 DIAGNOSIS — R4689 Other symptoms and signs involving appearance and behavior: Secondary | ICD-10-CM | POA: Diagnosis not present

## 2018-06-24 DIAGNOSIS — R4189 Other symptoms and signs involving cognitive functions and awareness: Secondary | ICD-10-CM | POA: Diagnosis not present

## 2018-06-24 DIAGNOSIS — N183 Chronic kidney disease, stage 3 (moderate): Secondary | ICD-10-CM | POA: Diagnosis not present

## 2018-06-24 DIAGNOSIS — Q909 Down syndrome, unspecified: Secondary | ICD-10-CM | POA: Diagnosis not present

## 2018-06-24 DIAGNOSIS — G919 Hydrocephalus, unspecified: Secondary | ICD-10-CM | POA: Diagnosis not present

## 2018-06-24 DIAGNOSIS — E785 Hyperlipidemia, unspecified: Secondary | ICD-10-CM | POA: Diagnosis not present

## 2018-06-29 DIAGNOSIS — E538 Deficiency of other specified B group vitamins: Secondary | ICD-10-CM | POA: Diagnosis not present

## 2018-06-29 DIAGNOSIS — R5381 Other malaise: Secondary | ICD-10-CM | POA: Diagnosis not present

## 2018-06-29 DIAGNOSIS — A419 Sepsis, unspecified organism: Secondary | ICD-10-CM | POA: Diagnosis not present

## 2018-06-29 DIAGNOSIS — R1912 Hyperactive bowel sounds: Secondary | ICD-10-CM | POA: Diagnosis not present

## 2018-06-29 DIAGNOSIS — I82409 Acute embolism and thrombosis of unspecified deep veins of unspecified lower extremity: Secondary | ICD-10-CM | POA: Diagnosis not present

## 2018-06-29 DIAGNOSIS — I82491 Acute embolism and thrombosis of other specified deep vein of right lower extremity: Secondary | ICD-10-CM | POA: Diagnosis not present

## 2018-06-29 DIAGNOSIS — D631 Anemia in chronic kidney disease: Secondary | ICD-10-CM | POA: Diagnosis not present

## 2018-06-29 DIAGNOSIS — Q909 Down syndrome, unspecified: Secondary | ICD-10-CM | POA: Diagnosis not present

## 2018-06-29 DIAGNOSIS — Z95828 Presence of other vascular implants and grafts: Secondary | ICD-10-CM | POA: Diagnosis not present

## 2018-06-29 DIAGNOSIS — I82401 Acute embolism and thrombosis of unspecified deep veins of right lower extremity: Secondary | ICD-10-CM | POA: Diagnosis not present

## 2018-06-29 DIAGNOSIS — K5900 Constipation, unspecified: Secondary | ICD-10-CM | POA: Diagnosis not present

## 2018-06-29 DIAGNOSIS — N183 Chronic kidney disease, stage 3 (moderate): Secondary | ICD-10-CM | POA: Diagnosis not present

## 2018-06-29 DIAGNOSIS — B349 Viral infection, unspecified: Secondary | ICD-10-CM | POA: Diagnosis not present

## 2018-06-29 DIAGNOSIS — R262 Difficulty in walking, not elsewhere classified: Secondary | ICD-10-CM | POA: Diagnosis not present

## 2018-06-29 DIAGNOSIS — N189 Chronic kidney disease, unspecified: Secondary | ICD-10-CM | POA: Diagnosis not present

## 2018-06-29 DIAGNOSIS — Z9181 History of falling: Secondary | ICD-10-CM | POA: Diagnosis not present

## 2018-06-29 DIAGNOSIS — M6281 Muscle weakness (generalized): Secondary | ICD-10-CM | POA: Diagnosis not present

## 2018-06-29 DIAGNOSIS — R251 Tremor, unspecified: Secondary | ICD-10-CM | POA: Diagnosis not present

## 2018-06-29 DIAGNOSIS — M25572 Pain in left ankle and joints of left foot: Secondary | ICD-10-CM | POA: Diagnosis not present

## 2018-06-29 DIAGNOSIS — I619 Nontraumatic intracerebral hemorrhage, unspecified: Secondary | ICD-10-CM | POA: Diagnosis not present

## 2018-06-30 DIAGNOSIS — K5900 Constipation, unspecified: Secondary | ICD-10-CM | POA: Diagnosis not present

## 2018-06-30 DIAGNOSIS — R5381 Other malaise: Secondary | ICD-10-CM | POA: Diagnosis not present

## 2018-06-30 DIAGNOSIS — A419 Sepsis, unspecified organism: Secondary | ICD-10-CM | POA: Diagnosis not present

## 2018-06-30 DIAGNOSIS — B349 Viral infection, unspecified: Secondary | ICD-10-CM | POA: Diagnosis not present

## 2018-06-30 DIAGNOSIS — I82401 Acute embolism and thrombosis of unspecified deep veins of right lower extremity: Secondary | ICD-10-CM | POA: Diagnosis not present

## 2018-06-30 DIAGNOSIS — M25572 Pain in left ankle and joints of left foot: Secondary | ICD-10-CM | POA: Diagnosis not present

## 2018-06-30 DIAGNOSIS — Q909 Down syndrome, unspecified: Secondary | ICD-10-CM | POA: Diagnosis not present

## 2018-07-03 DIAGNOSIS — K5900 Constipation, unspecified: Secondary | ICD-10-CM | POA: Diagnosis not present

## 2018-07-03 DIAGNOSIS — R5381 Other malaise: Secondary | ICD-10-CM | POA: Diagnosis not present

## 2018-07-03 DIAGNOSIS — B349 Viral infection, unspecified: Secondary | ICD-10-CM | POA: Diagnosis not present

## 2018-07-03 DIAGNOSIS — A419 Sepsis, unspecified organism: Secondary | ICD-10-CM | POA: Diagnosis not present

## 2018-07-03 DIAGNOSIS — Q909 Down syndrome, unspecified: Secondary | ICD-10-CM | POA: Diagnosis not present

## 2018-07-03 DIAGNOSIS — M25572 Pain in left ankle and joints of left foot: Secondary | ICD-10-CM | POA: Diagnosis not present

## 2018-07-03 DIAGNOSIS — I82401 Acute embolism and thrombosis of unspecified deep veins of right lower extremity: Secondary | ICD-10-CM | POA: Diagnosis not present

## 2018-07-07 DIAGNOSIS — I619 Nontraumatic intracerebral hemorrhage, unspecified: Secondary | ICD-10-CM | POA: Diagnosis not present

## 2018-07-07 DIAGNOSIS — R251 Tremor, unspecified: Secondary | ICD-10-CM | POA: Diagnosis not present

## 2018-07-07 DIAGNOSIS — K5900 Constipation, unspecified: Secondary | ICD-10-CM | POA: Diagnosis not present

## 2018-07-12 DIAGNOSIS — R29818 Other symptoms and signs involving the nervous system: Secondary | ICD-10-CM | POA: Diagnosis not present

## 2018-07-12 DIAGNOSIS — D631 Anemia in chronic kidney disease: Secondary | ICD-10-CM | POA: Diagnosis not present

## 2018-07-12 DIAGNOSIS — R262 Difficulty in walking, not elsewhere classified: Secondary | ICD-10-CM | POA: Diagnosis not present

## 2018-07-12 DIAGNOSIS — Z9181 History of falling: Secondary | ICD-10-CM | POA: Diagnosis not present

## 2018-07-12 DIAGNOSIS — R2243 Localized swelling, mass and lump, lower limb, bilateral: Secondary | ICD-10-CM | POA: Diagnosis not present

## 2018-07-12 DIAGNOSIS — R509 Fever, unspecified: Secondary | ICD-10-CM | POA: Diagnosis not present

## 2018-07-12 DIAGNOSIS — G8929 Other chronic pain: Secondary | ICD-10-CM | POA: Diagnosis not present

## 2018-07-12 DIAGNOSIS — F0280 Dementia in other diseases classified elsewhere without behavioral disturbance: Secondary | ICD-10-CM | POA: Diagnosis not present

## 2018-07-12 DIAGNOSIS — I82491 Acute embolism and thrombosis of other specified deep vein of right lower extremity: Secondary | ICD-10-CM | POA: Diagnosis not present

## 2018-07-12 DIAGNOSIS — N183 Chronic kidney disease, stage 3 (moderate): Secondary | ICD-10-CM | POA: Diagnosis not present

## 2018-07-12 DIAGNOSIS — R1312 Dysphagia, oropharyngeal phase: Secondary | ICD-10-CM | POA: Diagnosis not present

## 2018-07-12 DIAGNOSIS — H5589 Other irregular eye movements: Secondary | ICD-10-CM | POA: Diagnosis not present

## 2018-07-12 DIAGNOSIS — T7840XA Allergy, unspecified, initial encounter: Secondary | ICD-10-CM | POA: Diagnosis not present

## 2018-07-12 DIAGNOSIS — M25572 Pain in left ankle and joints of left foot: Secondary | ICD-10-CM | POA: Diagnosis not present

## 2018-07-12 DIAGNOSIS — Q909 Down syndrome, unspecified: Secondary | ICD-10-CM | POA: Diagnosis not present

## 2018-07-12 DIAGNOSIS — R259 Unspecified abnormal involuntary movements: Secondary | ICD-10-CM | POA: Diagnosis not present

## 2018-07-12 DIAGNOSIS — Z79899 Other long term (current) drug therapy: Secondary | ICD-10-CM | POA: Diagnosis not present

## 2018-07-12 DIAGNOSIS — G4720 Circadian rhythm sleep disorder, unspecified type: Secondary | ICD-10-CM | POA: Diagnosis not present

## 2018-07-12 DIAGNOSIS — M6281 Muscle weakness (generalized): Secondary | ICD-10-CM | POA: Diagnosis not present

## 2018-07-12 DIAGNOSIS — Z95828 Presence of other vascular implants and grafts: Secondary | ICD-10-CM | POA: Diagnosis not present

## 2018-07-12 DIAGNOSIS — R05 Cough: Secondary | ICD-10-CM | POA: Diagnosis not present

## 2018-07-21 DIAGNOSIS — R259 Unspecified abnormal involuntary movements: Secondary | ICD-10-CM | POA: Diagnosis not present

## 2018-07-21 DIAGNOSIS — Q909 Down syndrome, unspecified: Secondary | ICD-10-CM | POA: Diagnosis not present

## 2018-07-21 DIAGNOSIS — R05 Cough: Secondary | ICD-10-CM | POA: Diagnosis not present

## 2018-07-21 DIAGNOSIS — F0280 Dementia in other diseases classified elsewhere without behavioral disturbance: Secondary | ICD-10-CM | POA: Diagnosis not present

## 2018-07-21 DIAGNOSIS — H5589 Other irregular eye movements: Secondary | ICD-10-CM | POA: Diagnosis not present

## 2018-07-21 DIAGNOSIS — R29818 Other symptoms and signs involving the nervous system: Secondary | ICD-10-CM | POA: Diagnosis not present

## 2018-07-21 DIAGNOSIS — R509 Fever, unspecified: Secondary | ICD-10-CM | POA: Diagnosis not present

## 2018-07-21 DIAGNOSIS — G4720 Circadian rhythm sleep disorder, unspecified type: Secondary | ICD-10-CM | POA: Diagnosis not present

## 2018-07-23 DIAGNOSIS — R509 Fever, unspecified: Secondary | ICD-10-CM | POA: Diagnosis not present

## 2018-07-23 DIAGNOSIS — R259 Unspecified abnormal involuntary movements: Secondary | ICD-10-CM | POA: Diagnosis not present

## 2018-07-23 DIAGNOSIS — R05 Cough: Secondary | ICD-10-CM | POA: Diagnosis not present

## 2018-07-28 DIAGNOSIS — Q909 Down syndrome, unspecified: Secondary | ICD-10-CM | POA: Diagnosis not present

## 2018-07-28 DIAGNOSIS — G8929 Other chronic pain: Secondary | ICD-10-CM | POA: Diagnosis not present

## 2018-07-28 DIAGNOSIS — T7840XA Allergy, unspecified, initial encounter: Secondary | ICD-10-CM | POA: Diagnosis not present

## 2018-07-29 DIAGNOSIS — R2243 Localized swelling, mass and lump, lower limb, bilateral: Secondary | ICD-10-CM | POA: Diagnosis not present

## 2018-08-18 DIAGNOSIS — Z9181 History of falling: Secondary | ICD-10-CM | POA: Diagnosis not present

## 2018-08-18 DIAGNOSIS — N5089 Other specified disorders of the male genital organs: Secondary | ICD-10-CM | POA: Diagnosis not present

## 2018-08-18 DIAGNOSIS — Q909 Down syndrome, unspecified: Secondary | ICD-10-CM | POA: Diagnosis not present

## 2018-08-18 DIAGNOSIS — R400 Somnolence: Secondary | ICD-10-CM | POA: Diagnosis not present

## 2018-08-18 DIAGNOSIS — R58 Hemorrhage, not elsewhere classified: Secondary | ICD-10-CM | POA: Diagnosis not present

## 2018-08-24 DIAGNOSIS — T7840XA Allergy, unspecified, initial encounter: Secondary | ICD-10-CM | POA: Diagnosis not present

## 2018-08-24 DIAGNOSIS — N189 Chronic kidney disease, unspecified: Secondary | ICD-10-CM | POA: Diagnosis not present

## 2018-08-24 DIAGNOSIS — G8929 Other chronic pain: Secondary | ICD-10-CM | POA: Diagnosis not present

## 2018-08-24 DIAGNOSIS — Q909 Down syndrome, unspecified: Secondary | ICD-10-CM | POA: Diagnosis not present

## 2018-09-06 DIAGNOSIS — M25572 Pain in left ankle and joints of left foot: Secondary | ICD-10-CM | POA: Diagnosis not present

## 2018-09-06 DIAGNOSIS — M79662 Pain in left lower leg: Secondary | ICD-10-CM | POA: Diagnosis not present

## 2018-09-08 DIAGNOSIS — Q909 Down syndrome, unspecified: Secondary | ICD-10-CM | POA: Diagnosis not present

## 2018-09-08 DIAGNOSIS — R400 Somnolence: Secondary | ICD-10-CM | POA: Diagnosis not present

## 2018-09-08 DIAGNOSIS — Z9181 History of falling: Secondary | ICD-10-CM | POA: Diagnosis not present

## 2018-09-08 DIAGNOSIS — N5089 Other specified disorders of the male genital organs: Secondary | ICD-10-CM | POA: Diagnosis not present

## 2018-09-08 DIAGNOSIS — R58 Hemorrhage, not elsewhere classified: Secondary | ICD-10-CM | POA: Diagnosis not present

## 2018-09-09 DIAGNOSIS — Z7401 Bed confinement status: Secondary | ICD-10-CM | POA: Diagnosis not present

## 2018-09-09 DIAGNOSIS — R269 Unspecified abnormalities of gait and mobility: Secondary | ICD-10-CM | POA: Diagnosis not present

## 2018-09-09 DIAGNOSIS — Q909 Down syndrome, unspecified: Secondary | ICD-10-CM | POA: Diagnosis not present

## 2018-09-09 DIAGNOSIS — R569 Unspecified convulsions: Secondary | ICD-10-CM | POA: Diagnosis not present

## 2018-09-09 DIAGNOSIS — F0280 Dementia in other diseases classified elsewhere without behavioral disturbance: Secondary | ICD-10-CM | POA: Diagnosis not present

## 2018-09-09 DIAGNOSIS — R296 Repeated falls: Secondary | ICD-10-CM | POA: Diagnosis not present

## 2018-09-09 DIAGNOSIS — S14109S Unspecified injury at unspecified level of cervical spinal cord, sequela: Secondary | ICD-10-CM | POA: Diagnosis not present

## 2018-09-10 DIAGNOSIS — N5089 Other specified disorders of the male genital organs: Secondary | ICD-10-CM | POA: Diagnosis not present

## 2018-09-10 DIAGNOSIS — Z9181 History of falling: Secondary | ICD-10-CM | POA: Diagnosis not present

## 2018-09-10 DIAGNOSIS — R400 Somnolence: Secondary | ICD-10-CM | POA: Diagnosis not present

## 2018-09-10 DIAGNOSIS — Q909 Down syndrome, unspecified: Secondary | ICD-10-CM | POA: Diagnosis not present

## 2018-09-10 DIAGNOSIS — R58 Hemorrhage, not elsewhere classified: Secondary | ICD-10-CM | POA: Diagnosis not present

## 2018-09-12 DIAGNOSIS — R400 Somnolence: Secondary | ICD-10-CM | POA: Diagnosis not present

## 2018-09-17 DIAGNOSIS — L97919 Non-pressure chronic ulcer of unspecified part of right lower leg with unspecified severity: Secondary | ICD-10-CM | POA: Diagnosis not present

## 2018-09-17 DIAGNOSIS — L97929 Non-pressure chronic ulcer of unspecified part of left lower leg with unspecified severity: Secondary | ICD-10-CM | POA: Diagnosis not present

## 2018-09-24 DIAGNOSIS — R339 Retention of urine, unspecified: Secondary | ICD-10-CM | POA: Diagnosis not present

## 2018-09-24 DIAGNOSIS — M25551 Pain in right hip: Secondary | ICD-10-CM | POA: Diagnosis not present

## 2018-09-24 DIAGNOSIS — F0280 Dementia in other diseases classified elsewhere without behavioral disturbance: Secondary | ICD-10-CM | POA: Diagnosis not present

## 2018-09-24 DIAGNOSIS — M009 Pyogenic arthritis, unspecified: Secondary | ICD-10-CM | POA: Diagnosis not present

## 2018-09-24 DIAGNOSIS — M67351 Transient synovitis, right hip: Secondary | ICD-10-CM | POA: Diagnosis not present

## 2018-09-24 DIAGNOSIS — M25552 Pain in left hip: Secondary | ICD-10-CM | POA: Diagnosis not present

## 2018-09-24 DIAGNOSIS — E860 Dehydration: Secondary | ICD-10-CM | POA: Diagnosis not present

## 2018-09-24 DIAGNOSIS — M6588 Other synovitis and tenosynovitis, other site: Secondary | ICD-10-CM | POA: Diagnosis not present

## 2018-09-24 DIAGNOSIS — Q902 Trisomy 21, translocation: Secondary | ICD-10-CM | POA: Diagnosis not present

## 2018-09-24 DIAGNOSIS — R404 Transient alteration of awareness: Secondary | ICD-10-CM | POA: Diagnosis not present

## 2018-09-24 DIAGNOSIS — D699 Hemorrhagic condition, unspecified: Secondary | ICD-10-CM | POA: Diagnosis not present

## 2018-09-24 DIAGNOSIS — N183 Chronic kidney disease, stage 3 (moderate): Secondary | ICD-10-CM | POA: Diagnosis not present

## 2018-09-24 DIAGNOSIS — N390 Urinary tract infection, site not specified: Secondary | ICD-10-CM | POA: Diagnosis not present

## 2018-09-24 DIAGNOSIS — M25452 Effusion, left hip: Secondary | ICD-10-CM | POA: Diagnosis not present

## 2018-09-24 DIAGNOSIS — Z86718 Personal history of other venous thrombosis and embolism: Secondary | ICD-10-CM | POA: Diagnosis not present

## 2018-09-24 DIAGNOSIS — N16 Renal tubulo-interstitial disorders in diseases classified elsewhere: Secondary | ICD-10-CM | POA: Diagnosis not present

## 2018-09-24 DIAGNOSIS — R740 Nonspecific elevation of levels of transaminase and lactic acid dehydrogenase [LDH]: Secondary | ICD-10-CM | POA: Diagnosis not present

## 2018-09-24 DIAGNOSIS — F99 Mental disorder, not otherwise specified: Secondary | ICD-10-CM | POA: Diagnosis not present

## 2018-09-24 DIAGNOSIS — R52 Pain, unspecified: Secondary | ICD-10-CM | POA: Diagnosis not present

## 2018-09-24 DIAGNOSIS — S79912A Unspecified injury of left hip, initial encounter: Secondary | ICD-10-CM | POA: Diagnosis not present

## 2018-09-24 DIAGNOSIS — R5381 Other malaise: Secondary | ICD-10-CM | POA: Diagnosis not present

## 2018-09-24 DIAGNOSIS — B961 Klebsiella pneumoniae [K. pneumoniae] as the cause of diseases classified elsewhere: Secondary | ICD-10-CM | POA: Diagnosis not present

## 2018-09-24 DIAGNOSIS — R279 Unspecified lack of coordination: Secondary | ICD-10-CM | POA: Diagnosis not present

## 2018-09-24 DIAGNOSIS — N179 Acute kidney failure, unspecified: Secondary | ICD-10-CM | POA: Diagnosis not present

## 2018-09-24 DIAGNOSIS — I679 Cerebrovascular disease, unspecified: Secondary | ICD-10-CM | POA: Diagnosis not present

## 2018-09-24 DIAGNOSIS — R5383 Other fatigue: Secondary | ICD-10-CM | POA: Diagnosis not present

## 2018-09-24 DIAGNOSIS — R4182 Altered mental status, unspecified: Secondary | ICD-10-CM | POA: Diagnosis not present

## 2018-09-24 DIAGNOSIS — S72092A Other fracture of head and neck of left femur, initial encounter for closed fracture: Secondary | ICD-10-CM | POA: Diagnosis not present

## 2018-09-24 DIAGNOSIS — M6281 Muscle weakness (generalized): Secondary | ICD-10-CM | POA: Diagnosis not present

## 2018-09-24 DIAGNOSIS — N12 Tubulo-interstitial nephritis, not specified as acute or chronic: Secondary | ICD-10-CM | POA: Diagnosis not present

## 2018-09-24 DIAGNOSIS — S76012A Strain of muscle, fascia and tendon of left hip, initial encounter: Secondary | ICD-10-CM | POA: Diagnosis not present

## 2018-09-24 DIAGNOSIS — Z95828 Presence of other vascular implants and grafts: Secondary | ICD-10-CM | POA: Diagnosis not present

## 2018-09-24 DIAGNOSIS — R531 Weakness: Secondary | ICD-10-CM | POA: Diagnosis not present

## 2018-09-24 DIAGNOSIS — S76092A Other specified injury of muscle, fascia and tendon of left hip, initial encounter: Secondary | ICD-10-CM | POA: Diagnosis not present

## 2018-09-24 DIAGNOSIS — S3993XA Unspecified injury of pelvis, initial encounter: Secondary | ICD-10-CM | POA: Diagnosis not present

## 2018-09-24 DIAGNOSIS — A419 Sepsis, unspecified organism: Secondary | ICD-10-CM | POA: Diagnosis not present

## 2018-09-24 DIAGNOSIS — S79911A Unspecified injury of right hip, initial encounter: Secondary | ICD-10-CM | POA: Diagnosis not present

## 2018-09-24 DIAGNOSIS — Q909 Down syndrome, unspecified: Secondary | ICD-10-CM | POA: Diagnosis not present

## 2018-09-24 DIAGNOSIS — M25451 Effusion, right hip: Secondary | ICD-10-CM | POA: Diagnosis not present

## 2018-10-27 DIAGNOSIS — M24562 Contracture, left knee: Secondary | ICD-10-CM | POA: Diagnosis not present

## 2018-10-27 DIAGNOSIS — R5381 Other malaise: Secondary | ICD-10-CM | POA: Diagnosis not present

## 2018-10-27 DIAGNOSIS — Q909 Down syndrome, unspecified: Secondary | ICD-10-CM | POA: Diagnosis not present

## 2018-10-27 DIAGNOSIS — R1312 Dysphagia, oropharyngeal phase: Secondary | ICD-10-CM | POA: Diagnosis not present

## 2018-10-27 DIAGNOSIS — M24561 Contracture, right knee: Secondary | ICD-10-CM | POA: Diagnosis not present

## 2018-10-27 DIAGNOSIS — M6281 Muscle weakness (generalized): Secondary | ICD-10-CM | POA: Diagnosis not present

## 2018-10-27 DIAGNOSIS — E7849 Other hyperlipidemia: Secondary | ICD-10-CM | POA: Diagnosis not present

## 2018-10-27 DIAGNOSIS — N390 Urinary tract infection, site not specified: Secondary | ICD-10-CM | POA: Diagnosis not present

## 2018-10-28 DIAGNOSIS — M24562 Contracture, left knee: Secondary | ICD-10-CM | POA: Diagnosis not present

## 2018-10-28 DIAGNOSIS — M6281 Muscle weakness (generalized): Secondary | ICD-10-CM | POA: Diagnosis not present

## 2018-10-28 DIAGNOSIS — R1312 Dysphagia, oropharyngeal phase: Secondary | ICD-10-CM | POA: Diagnosis not present

## 2018-10-28 DIAGNOSIS — M24561 Contracture, right knee: Secondary | ICD-10-CM | POA: Diagnosis not present

## 2018-10-29 DIAGNOSIS — M24562 Contracture, left knee: Secondary | ICD-10-CM | POA: Diagnosis not present

## 2018-10-29 DIAGNOSIS — R1312 Dysphagia, oropharyngeal phase: Secondary | ICD-10-CM | POA: Diagnosis not present

## 2018-10-29 DIAGNOSIS — M24561 Contracture, right knee: Secondary | ICD-10-CM | POA: Diagnosis not present

## 2018-10-29 DIAGNOSIS — M6281 Muscle weakness (generalized): Secondary | ICD-10-CM | POA: Diagnosis not present

## 2018-10-30 DIAGNOSIS — M6281 Muscle weakness (generalized): Secondary | ICD-10-CM | POA: Diagnosis not present

## 2018-10-30 DIAGNOSIS — R531 Weakness: Secondary | ICD-10-CM | POA: Diagnosis not present

## 2018-10-30 DIAGNOSIS — M24562 Contracture, left knee: Secondary | ICD-10-CM | POA: Diagnosis not present

## 2018-10-30 DIAGNOSIS — M24561 Contracture, right knee: Secondary | ICD-10-CM | POA: Diagnosis not present

## 2018-10-30 DIAGNOSIS — R1312 Dysphagia, oropharyngeal phase: Secondary | ICD-10-CM | POA: Diagnosis not present

## 2018-10-30 DIAGNOSIS — M2550 Pain in unspecified joint: Secondary | ICD-10-CM | POA: Diagnosis not present

## 2018-10-30 DIAGNOSIS — R404 Transient alteration of awareness: Secondary | ICD-10-CM | POA: Diagnosis not present

## 2018-10-30 DIAGNOSIS — Z7401 Bed confinement status: Secondary | ICD-10-CM | POA: Diagnosis not present

## 2018-10-31 DIAGNOSIS — M24562 Contracture, left knee: Secondary | ICD-10-CM | POA: Diagnosis not present

## 2018-10-31 DIAGNOSIS — M24561 Contracture, right knee: Secondary | ICD-10-CM | POA: Diagnosis not present

## 2018-10-31 DIAGNOSIS — M6281 Muscle weakness (generalized): Secondary | ICD-10-CM | POA: Diagnosis not present

## 2018-10-31 DIAGNOSIS — R1312 Dysphagia, oropharyngeal phase: Secondary | ICD-10-CM | POA: Diagnosis not present

## 2018-11-01 DIAGNOSIS — M6281 Muscle weakness (generalized): Secondary | ICD-10-CM | POA: Diagnosis not present

## 2018-11-01 DIAGNOSIS — M24562 Contracture, left knee: Secondary | ICD-10-CM | POA: Diagnosis not present

## 2018-11-01 DIAGNOSIS — M24561 Contracture, right knee: Secondary | ICD-10-CM | POA: Diagnosis not present

## 2018-11-01 DIAGNOSIS — R1312 Dysphagia, oropharyngeal phase: Secondary | ICD-10-CM | POA: Diagnosis not present

## 2018-11-02 DIAGNOSIS — M24562 Contracture, left knee: Secondary | ICD-10-CM | POA: Diagnosis not present

## 2018-11-02 DIAGNOSIS — M6281 Muscle weakness (generalized): Secondary | ICD-10-CM | POA: Diagnosis not present

## 2018-11-02 DIAGNOSIS — M24561 Contracture, right knee: Secondary | ICD-10-CM | POA: Diagnosis not present

## 2018-11-02 DIAGNOSIS — R1312 Dysphagia, oropharyngeal phase: Secondary | ICD-10-CM | POA: Diagnosis not present

## 2018-11-03 DIAGNOSIS — M24561 Contracture, right knee: Secondary | ICD-10-CM | POA: Diagnosis not present

## 2018-11-03 DIAGNOSIS — M24562 Contracture, left knee: Secondary | ICD-10-CM | POA: Diagnosis not present

## 2018-11-03 DIAGNOSIS — R1312 Dysphagia, oropharyngeal phase: Secondary | ICD-10-CM | POA: Diagnosis not present

## 2018-11-03 DIAGNOSIS — M6281 Muscle weakness (generalized): Secondary | ICD-10-CM | POA: Diagnosis not present

## 2018-11-04 DIAGNOSIS — M24562 Contracture, left knee: Secondary | ICD-10-CM | POA: Diagnosis not present

## 2018-11-04 DIAGNOSIS — R1312 Dysphagia, oropharyngeal phase: Secondary | ICD-10-CM | POA: Diagnosis not present

## 2018-11-04 DIAGNOSIS — M6281 Muscle weakness (generalized): Secondary | ICD-10-CM | POA: Diagnosis not present

## 2018-11-04 DIAGNOSIS — M24561 Contracture, right knee: Secondary | ICD-10-CM | POA: Diagnosis not present

## 2018-11-05 DIAGNOSIS — R1312 Dysphagia, oropharyngeal phase: Secondary | ICD-10-CM | POA: Diagnosis not present

## 2018-11-05 DIAGNOSIS — M6281 Muscle weakness (generalized): Secondary | ICD-10-CM | POA: Diagnosis not present

## 2018-11-05 DIAGNOSIS — M24562 Contracture, left knee: Secondary | ICD-10-CM | POA: Diagnosis not present

## 2018-11-05 DIAGNOSIS — M24561 Contracture, right knee: Secondary | ICD-10-CM | POA: Diagnosis not present

## 2018-11-06 DIAGNOSIS — M24561 Contracture, right knee: Secondary | ICD-10-CM | POA: Diagnosis not present

## 2018-11-06 DIAGNOSIS — R1312 Dysphagia, oropharyngeal phase: Secondary | ICD-10-CM | POA: Diagnosis not present

## 2018-11-06 DIAGNOSIS — M6281 Muscle weakness (generalized): Secondary | ICD-10-CM | POA: Diagnosis not present

## 2018-11-06 DIAGNOSIS — M24562 Contracture, left knee: Secondary | ICD-10-CM | POA: Diagnosis not present

## 2018-11-07 DIAGNOSIS — M6281 Muscle weakness (generalized): Secondary | ICD-10-CM | POA: Diagnosis not present

## 2018-11-07 DIAGNOSIS — R1312 Dysphagia, oropharyngeal phase: Secondary | ICD-10-CM | POA: Diagnosis not present

## 2018-11-07 DIAGNOSIS — M24562 Contracture, left knee: Secondary | ICD-10-CM | POA: Diagnosis not present

## 2018-11-07 DIAGNOSIS — M24561 Contracture, right knee: Secondary | ICD-10-CM | POA: Diagnosis not present

## 2018-11-09 DIAGNOSIS — M24562 Contracture, left knee: Secondary | ICD-10-CM | POA: Diagnosis not present

## 2018-11-09 DIAGNOSIS — R1312 Dysphagia, oropharyngeal phase: Secondary | ICD-10-CM | POA: Diagnosis not present

## 2018-11-09 DIAGNOSIS — I82401 Acute embolism and thrombosis of unspecified deep veins of right lower extremity: Secondary | ICD-10-CM | POA: Diagnosis not present

## 2018-11-09 DIAGNOSIS — M24561 Contracture, right knee: Secondary | ICD-10-CM | POA: Diagnosis not present

## 2018-11-09 DIAGNOSIS — M6281 Muscle weakness (generalized): Secondary | ICD-10-CM | POA: Diagnosis not present

## 2018-11-10 DIAGNOSIS — M6281 Muscle weakness (generalized): Secondary | ICD-10-CM | POA: Diagnosis not present

## 2018-11-10 DIAGNOSIS — M24561 Contracture, right knee: Secondary | ICD-10-CM | POA: Diagnosis not present

## 2018-11-10 DIAGNOSIS — M24562 Contracture, left knee: Secondary | ICD-10-CM | POA: Diagnosis not present

## 2018-11-10 DIAGNOSIS — R1312 Dysphagia, oropharyngeal phase: Secondary | ICD-10-CM | POA: Diagnosis not present

## 2018-11-19 ENCOUNTER — Ambulatory Visit: Payer: Medicare HMO

## 2019-05-05 ENCOUNTER — Other Ambulatory Visit (HOSPITAL_COMMUNITY): Payer: Self-pay | Admitting: Family Medicine

## 2019-05-09 LAB — NOVEL CORONAVIRUS, NAA: SARS-CoV-2, NAA: NOT DETECTED

## 2019-05-12 ENCOUNTER — Other Ambulatory Visit: Payer: Self-pay | Admitting: Family Medicine

## 2019-05-19 LAB — NOVEL CORONAVIRUS, NAA: SARS-CoV-2, NAA: NOT DETECTED

## 2019-08-19 ENCOUNTER — Inpatient Hospital Stay
Admission: EM | Admit: 2019-08-19 | Discharge: 2019-08-24 | DRG: 871 | Disposition: A | Discharge status: Skilled Nursing Facility | Payer: Medicare Other | Source: Skilled Nursing Facility | Attending: Internal Medicine | Admitting: Internal Medicine

## 2019-08-19 ENCOUNTER — Other Ambulatory Visit: Payer: Self-pay

## 2019-08-19 ENCOUNTER — Inpatient Hospital Stay: Payer: Medicare Other

## 2019-08-19 ENCOUNTER — Emergency Department: Payer: Medicare Other

## 2019-08-19 DIAGNOSIS — Z8249 Family history of ischemic heart disease and other diseases of the circulatory system: Secondary | ICD-10-CM | POA: Diagnosis not present

## 2019-08-19 DIAGNOSIS — Z515 Encounter for palliative care: Secondary | ICD-10-CM | POA: Diagnosis not present

## 2019-08-19 DIAGNOSIS — N184 Chronic kidney disease, stage 4 (severe): Secondary | ICD-10-CM | POA: Diagnosis present

## 2019-08-19 DIAGNOSIS — Z841 Family history of disorders of kidney and ureter: Secondary | ICD-10-CM | POA: Diagnosis not present

## 2019-08-19 DIAGNOSIS — Z8261 Family history of arthritis: Secondary | ICD-10-CM

## 2019-08-19 DIAGNOSIS — J9690 Respiratory failure, unspecified, unspecified whether with hypoxia or hypercapnia: Secondary | ICD-10-CM

## 2019-08-19 DIAGNOSIS — Z7189 Other specified counseling: Secondary | ICD-10-CM

## 2019-08-19 DIAGNOSIS — N179 Acute kidney failure, unspecified: Secondary | ICD-10-CM | POA: Diagnosis present

## 2019-08-19 DIAGNOSIS — R6521 Severe sepsis with septic shock: Secondary | ICD-10-CM | POA: Diagnosis present

## 2019-08-19 DIAGNOSIS — Z20828 Contact with and (suspected) exposure to other viral communicable diseases: Secondary | ICD-10-CM | POA: Diagnosis present

## 2019-08-19 DIAGNOSIS — L89521 Pressure ulcer of left ankle, stage 1: Secondary | ICD-10-CM | POA: Diagnosis present

## 2019-08-19 DIAGNOSIS — Z0189 Encounter for other specified special examinations: Secondary | ICD-10-CM

## 2019-08-19 DIAGNOSIS — E876 Hypokalemia: Secondary | ICD-10-CM | POA: Diagnosis not present

## 2019-08-19 DIAGNOSIS — F0390 Unspecified dementia without behavioral disturbance: Secondary | ICD-10-CM | POA: Diagnosis present

## 2019-08-19 DIAGNOSIS — E870 Hyperosmolality and hypernatremia: Secondary | ICD-10-CM | POA: Diagnosis present

## 2019-08-19 DIAGNOSIS — J690 Pneumonitis due to inhalation of food and vomit: Secondary | ICD-10-CM | POA: Diagnosis present

## 2019-08-19 DIAGNOSIS — J9602 Acute respiratory failure with hypercapnia: Secondary | ICD-10-CM | POA: Diagnosis present

## 2019-08-19 DIAGNOSIS — A419 Sepsis, unspecified organism: Secondary | ICD-10-CM | POA: Diagnosis present

## 2019-08-19 DIAGNOSIS — J189 Pneumonia, unspecified organism: Secondary | ICD-10-CM

## 2019-08-19 DIAGNOSIS — M1990 Unspecified osteoarthritis, unspecified site: Secondary | ICD-10-CM | POA: Diagnosis present

## 2019-08-19 DIAGNOSIS — Q909 Down syndrome, unspecified: Secondary | ICD-10-CM

## 2019-08-19 DIAGNOSIS — J9600 Acute respiratory failure, unspecified whether with hypoxia or hypercapnia: Secondary | ICD-10-CM

## 2019-08-19 DIAGNOSIS — M5136 Other intervertebral disc degeneration, lumbar region: Secondary | ICD-10-CM | POA: Diagnosis present

## 2019-08-19 DIAGNOSIS — J9601 Acute respiratory failure with hypoxia: Secondary | ICD-10-CM | POA: Diagnosis present

## 2019-08-19 DIAGNOSIS — R1310 Dysphagia, unspecified: Secondary | ICD-10-CM | POA: Diagnosis present

## 2019-08-19 DIAGNOSIS — L8990 Pressure ulcer of unspecified site, unspecified stage: Secondary | ICD-10-CM | POA: Insufficient documentation

## 2019-08-19 LAB — BLOOD GAS, ARTERIAL
Acid-base deficit: 7.4 mmol/L — ABNORMAL HIGH (ref 0.0–2.0)
Acid-base deficit: 8.7 mmol/L — ABNORMAL HIGH (ref 0.0–2.0)
Bicarbonate: 17.3 mmol/L — ABNORMAL LOW (ref 20.0–28.0)
Bicarbonate: 19.5 mmol/L — ABNORMAL LOW (ref 20.0–28.0)
FIO2: 0.32
FIO2: 100
MECHVT: 400 mL
O2 Saturation: 91 %
O2 Saturation: 99.3 %
PEEP: 7 cmH2O
Patient temperature: 37
Patient temperature: 39.3
RATE: 16 resp/min
pCO2 arterial: 32 mmHg (ref 32.0–48.0)
pCO2 arterial: 55 mmHg — ABNORMAL HIGH (ref 32.0–48.0)
pH, Arterial: 7.34 — ABNORMAL LOW (ref 7.350–7.450)
pH, Arterial: 7.17 — CL (ref 7.350–7.450)
pO2, Arterial: 65 mmHg — ABNORMAL LOW (ref 83.0–108.0)
pO2, Arterial: 190 mmHg — ABNORMAL HIGH (ref 83.0–108.0)

## 2019-08-19 LAB — URINALYSIS, ROUTINE W REFLEX MICROSCOPIC
Bilirubin Urine: NEGATIVE
Glucose, UA: NEGATIVE mg/dL
Hgb urine dipstick: NEGATIVE
Ketones, ur: NEGATIVE mg/dL
Leukocytes,Ua: NEGATIVE
Nitrite: NEGATIVE
Protein, ur: 30 mg/dL — AB
Specific Gravity, Urine: 1.026 (ref 1.005–1.030)
pH: 5 (ref 5.0–8.0)

## 2019-08-19 LAB — CBC WITH DIFFERENTIAL/PLATELET
Abs Immature Granulocytes: 0.03 10*3/uL (ref 0.00–0.07)
Basophils Absolute: 0 10*3/uL (ref 0.0–0.1)
Basophils Relative: 1 %
Eosinophils Absolute: 0 10*3/uL (ref 0.0–0.5)
Eosinophils Relative: 0 %
HCT: 39.7 % (ref 39.0–52.0)
Hemoglobin: 12.2 g/dL — ABNORMAL LOW (ref 13.0–17.0)
Immature Granulocytes: 0 %
Lymphocytes Relative: 14 %
Lymphs Abs: 1.2 10*3/uL (ref 0.7–4.0)
MCH: 29.4 pg (ref 26.0–34.0)
MCHC: 30.7 g/dL (ref 30.0–36.0)
MCV: 95.7 fL (ref 80.0–100.0)
Monocytes Absolute: 0.5 10*3/uL (ref 0.1–1.0)
Monocytes Relative: 5 %
Neutro Abs: 7 10*3/uL (ref 1.7–7.7)
Neutrophils Relative %: 80 %
Platelets: 284 10*3/uL (ref 150–400)
RBC: 4.15 MIL/uL — ABNORMAL LOW (ref 4.22–5.81)
RDW: 17.2 % — ABNORMAL HIGH (ref 11.5–15.5)
Smear Review: NORMAL
WBC: 8.7 10*3/uL (ref 4.0–10.5)
nRBC: 0 % (ref 0.0–0.2)

## 2019-08-19 LAB — CREATININE, SERUM
Creatinine, Ser: 1.81 mg/dL — ABNORMAL HIGH (ref 0.61–1.24)
GFR calc Af Amer: 47 mL/min — ABNORMAL LOW (ref >60)
GFR calc non Af Amer: 41 mL/min — ABNORMAL LOW (ref >60)

## 2019-08-19 LAB — LACTIC ACID, PLASMA
Lactic Acid, Venous: 7.2 mmol/L (ref 0.5–1.9)
Lactic Acid, Venous: 8.7 mmol/L (ref 0.5–1.9)
Lactic Acid, Venous: 8.1 mmol/L (ref 0.5–1.9)

## 2019-08-19 LAB — HIV ANTIBODY (ROUTINE TESTING W REFLEX): HIV Screen 4th Generation wRfx: NONREACTIVE

## 2019-08-19 LAB — COMPREHENSIVE METABOLIC PANEL
ALT: 82 U/L — ABNORMAL HIGH (ref 0–44)
AST: 61 U/L — ABNORMAL HIGH (ref 15–41)
Albumin: 3.2 g/dL — ABNORMAL LOW (ref 3.5–5.0)
Alkaline Phosphatase: 49 U/L (ref 38–126)
Anion gap: 24 — ABNORMAL HIGH (ref 5–15)
BUN: 41 mg/dL — ABNORMAL HIGH (ref 6–20)
CO2: 20 mmol/L — ABNORMAL LOW (ref 22–32)
Calcium: 8 mg/dL — ABNORMAL LOW (ref 8.9–10.3)
Chloride: 108 mmol/L (ref 98–111)
Creatinine, Ser: 1.72 mg/dL — ABNORMAL HIGH (ref 0.61–1.24)
GFR calc Af Amer: 50 mL/min — ABNORMAL LOW (ref >60)
GFR calc non Af Amer: 43 mL/min — ABNORMAL LOW (ref >60)
Glucose, Bld: 147 mg/dL — ABNORMAL HIGH (ref 70–99)
Potassium: 4.3 mmol/L (ref 3.5–5.1)
Sodium: 152 mmol/L — ABNORMAL HIGH (ref 135–145)
Total Bilirubin: 0.6 mg/dL (ref 0.3–1.2)
Total Protein: 7.3 g/dL (ref 6.5–8.1)

## 2019-08-19 LAB — APTT: aPTT: 28 seconds (ref 24–36)

## 2019-08-19 LAB — MRSA PCR SCREENING: MRSA by PCR: NEGATIVE

## 2019-08-19 LAB — PROCALCITONIN: Procalcitonin: 53.7 ng/mL

## 2019-08-19 LAB — PROTIME-INR
INR: 1.3 — ABNORMAL HIGH (ref 0.8–1.2)
Prothrombin Time: 15.8 seconds — ABNORMAL HIGH (ref 11.4–15.2)

## 2019-08-19 LAB — SARS CORONAVIRUS 2 BY RT PCR (HOSPITAL ORDER, PERFORMED IN ~~LOC~~ HOSPITAL LAB): SARS Coronavirus 2: NEGATIVE

## 2019-08-19 LAB — GLUCOSE, CAPILLARY: Glucose-Capillary: 138 mg/dL — ABNORMAL HIGH (ref 70–99)

## 2019-08-19 LAB — STREP PNEUMONIAE URINARY ANTIGEN: Strep Pneumo Urinary Antigen: NEGATIVE

## 2019-08-19 MED ORDER — ALBUTEROL SULFATE (2.5 MG/3ML) 0.083% IN NEBU: 2.5000 mg | INHALATION_SOLUTION | RESPIRATORY_TRACT | Status: DC | PRN

## 2019-08-19 MED ORDER — ORAL CARE MOUTH RINSE: 15.0000 mL | Freq: Two times a day (BID) | OROMUCOSAL | Status: DC

## 2019-08-19 MED ORDER — NOREPINEPHRINE 16 MG/250ML-% IV SOLN
0.0000 ug/min | INTRAVENOUS | Status: DC
  Administered 2019-08-19: 20 ug/min via INTRAVENOUS
  Filled 2019-08-19: qty 250

## 2019-08-19 MED ORDER — HYDROCODONE-ACETAMINOPHEN 5-325 MG PO TABS: 1.0000 | ORAL_TABLET | ORAL | Status: DC | PRN

## 2019-08-19 MED ORDER — SODIUM CHLORIDE 0.9 % IV SOLN
2.0000 g | Freq: Two times a day (BID) | INTRAVENOUS | Status: DC
  Administered 2019-08-19 – 2019-08-20 (×3): 2 g via INTRAVENOUS
  Filled 2019-08-19 (×5): qty 2

## 2019-08-19 MED ORDER — SODIUM BICARBONATE-DEXTROSE 150-5 MEQ/L-% IV SOLN
150.0000 meq | INTRAVENOUS | Status: AC
  Administered 2019-08-19: 150 meq via INTRAVENOUS
  Filled 2019-08-19: qty 1000

## 2019-08-19 MED ORDER — DOCUSATE SODIUM 50 MG/5ML PO LIQD
100.0000 mg | Freq: Two times a day (BID) | ORAL | Status: DC | PRN
  Filled 2019-08-19: qty 10

## 2019-08-19 MED ORDER — CHLORHEXIDINE GLUCONATE 0.12% ORAL RINSE (MEDLINE KIT)
15.0000 mL | Freq: Two times a day (BID) | OROMUCOSAL | Status: DC
  Administered 2019-08-19 – 2019-08-21 (×5): 15 mL via OROMUCOSAL

## 2019-08-19 MED ORDER — SODIUM CHLORIDE 0.9 % IV BOLUS
1000.0000 mL | Freq: Once | INTRAVENOUS | Status: AC
  Administered 2019-08-19: 1000 mL via INTRAVENOUS

## 2019-08-19 MED ORDER — FENTANYL CITRATE (PF) 100 MCG/2ML IJ SOLN
100.0000 ug | Freq: Once | INTRAMUSCULAR | Status: AC
  Administered 2019-08-19: 100 ug via INTRAVENOUS
  Filled 2019-08-19: qty 2

## 2019-08-19 MED ORDER — MIDAZOLAM HCL 2 MG/2ML IJ SOLN
2.0000 mg | INTRAMUSCULAR | Status: DC | PRN
  Administered 2019-08-20 (×2): 2 mg via INTRAVENOUS
  Filled 2019-08-19: qty 2

## 2019-08-19 MED ORDER — ACETAMINOPHEN 650 MG RE SUPP: 650.0000 mg | Freq: Four times a day (QID) | RECTAL | Status: DC | PRN

## 2019-08-19 MED ORDER — CLINDAMYCIN PHOSPHATE 600 MG/50ML IV SOLN
600.0000 mg | Freq: Once | INTRAVENOUS | Status: AC
  Administered 2019-08-19: 600 mg via INTRAVENOUS
  Filled 2019-08-19: qty 50

## 2019-08-19 MED ORDER — DEXTROSE 5 % IV SOLN
INTRAVENOUS | Status: DC
  Administered 2019-08-19: 15:00:00 via INTRAVENOUS

## 2019-08-19 MED ORDER — VANCOMYCIN HCL 1.5 G IV SOLR
1500.0000 mg | Freq: Once | INTRAVENOUS | Status: AC
  Administered 2019-08-19: 1500 mg via INTRAVENOUS
  Filled 2019-08-19: qty 1500

## 2019-08-19 MED ORDER — ONDANSETRON HCL 4 MG/2ML IJ SOLN: 4.0000 mg | Freq: Four times a day (QID) | INTRAMUSCULAR | Status: DC | PRN

## 2019-08-19 MED ORDER — NOREPINEPHRINE-SODIUM CHLORIDE 4-0.9 MG/250ML-% IV SOLN
INTRAVENOUS | Status: AC
  Administered 2019-08-19: 20 ug/min via INTRAVENOUS
  Filled 2019-08-19: qty 250

## 2019-08-19 MED ORDER — FENTANYL CITRATE (PF) 100 MCG/2ML IJ SOLN: 50.0000 ug | Freq: Once | INTRAMUSCULAR | Status: DC

## 2019-08-19 MED ORDER — ORAL CARE MOUTH RINSE
15.0000 mL | OROMUCOSAL | Status: DC
  Administered 2019-08-19 – 2019-08-22 (×24): 15 mL via OROMUCOSAL

## 2019-08-19 MED ORDER — DOCUSATE SODIUM 50 MG/5ML PO LIQD
100.0000 mg | Freq: Every day | ORAL | Status: DC
  Administered 2019-08-19 – 2019-08-24 (×5): 100 mg
  Filled 2019-08-19 (×5): qty 10

## 2019-08-19 MED ORDER — ROCURONIUM BROMIDE 50 MG/5ML IV SOLN
50.0000 mg | Freq: Once | INTRAVENOUS | Status: AC
  Administered 2019-08-19: 16:00:00 50 mg via INTRAVENOUS
  Filled 2019-08-19: qty 1

## 2019-08-19 MED ORDER — SODIUM CHLORIDE 0.9 % IV SOLN: INTRAVENOUS | Status: DC

## 2019-08-19 MED ORDER — DEXTROSE IN LACTATED RINGERS 5 % IV SOLN
INTRAVENOUS | Status: DC
  Administered 2019-08-19 – 2019-08-21 (×4): via INTRAVENOUS
  Administered 2019-08-22: 75 mL/h via INTRAVENOUS
  Administered 2019-08-22 – 2019-08-23 (×2): via INTRAVENOUS

## 2019-08-19 MED ORDER — SODIUM CHLORIDE 0.9 % IV SOLN
25.0000 ug/min | INTRAVENOUS | Status: DC
  Filled 2019-08-19: qty 4

## 2019-08-19 MED ORDER — SODIUM CHLORIDE 0.9 % IV SOLN
25.0000 ug/min | INTRAVENOUS | Status: DC
  Administered 2019-08-19: 25 ug/min via INTRAVENOUS
  Filled 2019-08-19: qty 10
  Filled 2019-08-19: qty 1

## 2019-08-19 MED ORDER — SODIUM CHLORIDE 0.9 % IV SOLN
1.0000 g | Freq: Once | INTRAVENOUS | Status: AC
  Administered 2019-08-19: 1 g via INTRAVENOUS
  Filled 2019-08-19: qty 1

## 2019-08-19 MED ORDER — STERILE WATER FOR INJECTION IJ SOLN
INTRAMUSCULAR | Status: AC
  Filled 2019-08-19: qty 10

## 2019-08-19 MED ORDER — FAMOTIDINE IN NACL 20-0.9 MG/50ML-% IV SOLN
20.0000 mg | INTRAVENOUS | Status: DC
  Administered 2019-08-19 – 2019-08-23 (×6): 20 mg via INTRAVENOUS
  Filled 2019-08-19 (×6): qty 50

## 2019-08-19 MED ORDER — ACETAMINOPHEN 325 MG PO TABS: 650.0000 mg | ORAL_TABLET | Freq: Four times a day (QID) | ORAL | Status: DC | PRN

## 2019-08-19 MED ORDER — HYDROCORTISONE NA SUCCINATE PF 100 MG IJ SOLR
50.0000 mg | Freq: Four times a day (QID) | INTRAMUSCULAR | Status: DC
  Administered 2019-08-19 – 2019-08-23 (×17): 50 mg via INTRAVENOUS
  Filled 2019-08-19 (×2): qty 1
  Filled 2019-08-19 (×6): qty 2
  Filled 2019-08-19 (×2): qty 1
  Filled 2019-08-19: qty 2
  Filled 2019-08-19: qty 1
  Filled 2019-08-19 (×2): qty 2
  Filled 2019-08-19: qty 1
  Filled 2019-08-19 (×3): qty 2

## 2019-08-19 MED ORDER — VASOPRESSIN 20 UNIT/ML IV SOLN
0.0300 [IU]/min | INTRAVENOUS | Status: DC
  Filled 2019-08-19: qty 2

## 2019-08-19 MED ORDER — CITALOPRAM HYDROBROMIDE 20 MG PO TABS
10.0000 mg | ORAL_TABLET | Freq: Every day | ORAL | Status: DC
  Administered 2019-08-20 – 2019-08-24 (×4): 10 mg
  Filled 2019-08-19 (×4): qty 1

## 2019-08-19 MED ORDER — CHLORHEXIDINE GLUCONATE CLOTH 2 % EX PADS
6.0000 | MEDICATED_PAD | Freq: Every day | CUTANEOUS | Status: DC
  Administered 2019-08-19 – 2019-08-24 (×5): 6 via TOPICAL

## 2019-08-19 MED ORDER — PHENYLEPHRINE HCL-NACL 10-0.9 MG/250ML-% IV SOLN
25.0000 ug/min | INTRAVENOUS | Status: DC
  Filled 2019-08-19: qty 250

## 2019-08-19 MED ORDER — SODIUM CHLORIDE 0.9 % IV SOLN
250.0000 mL | INTRAVENOUS | Status: DC
  Administered 2019-08-19: 250 mL via INTRAVENOUS

## 2019-08-19 MED ORDER — SODIUM CHLORIDE 0.9 % IV SOLN
500.0000 mg | INTRAVENOUS | Status: AC
  Administered 2019-08-20 – 2019-08-23 (×4): 500 mg via INTRAVENOUS
  Filled 2019-08-19 (×4): qty 500

## 2019-08-19 MED ORDER — ETOMIDATE 2 MG/ML IV SOLN
20.0000 mg | Freq: Once | INTRAVENOUS | Status: AC
  Administered 2019-08-19: 20 mg via INTRAVENOUS
  Filled 2019-08-19: qty 10

## 2019-08-19 MED ORDER — BISACODYL 5 MG PO TBEC: 5.0000 mg | DELAYED_RELEASE_TABLET | Freq: Every day | ORAL | Status: DC | PRN

## 2019-08-19 MED ORDER — SENNOSIDES 8.8 MG/5ML PO SYRP
5.0000 mL | ORAL_SOLUTION | Freq: Two times a day (BID) | ORAL | Status: DC
  Administered 2019-08-19 – 2019-08-24 (×9): 5 mL
  Filled 2019-08-19 (×11): qty 5

## 2019-08-19 MED ORDER — FENTANYL 2500MCG IN NS 250ML (10MCG/ML) PREMIX INFUSION
50.0000 ug/h | INTRAVENOUS | Status: DC
  Administered 2019-08-19 – 2019-08-21 (×2): 50 ug/h via INTRAVENOUS
  Filled 2019-08-19 (×2): qty 250

## 2019-08-19 MED ORDER — NOREPINEPHRINE 4 MG/250ML-% IV SOLN
0.0000 ug/min | INTRAVENOUS | Status: DC
  Administered 2019-08-19: 18:00:00 20 ug/min via INTRAVENOUS

## 2019-08-19 MED ORDER — FENTANYL BOLUS VIA INFUSION
50.0000 ug | INTRAVENOUS | Status: DC | PRN
  Administered 2019-08-20 (×2): 50 ug via INTRAVENOUS
  Filled 2019-08-19: qty 50

## 2019-08-19 MED ORDER — HEPARIN SODIUM (PORCINE) 5000 UNIT/ML IJ SOLN
5000.0000 [IU] | Freq: Three times a day (TID) | INTRAMUSCULAR | Status: DC
  Administered 2019-08-19 – 2019-08-24 (×15): 5000 [IU] via SUBCUTANEOUS
  Filled 2019-08-19 (×16): qty 1

## 2019-08-19 MED ORDER — FREE WATER
100.0000 mL | Status: DC
  Administered 2019-08-19 – 2019-08-21 (×9): 100 mL

## 2019-08-19 MED ORDER — SENNOSIDES-DOCUSATE SODIUM 8.6-50 MG PO TABS: 1.0000 | ORAL_TABLET | Freq: Every evening | ORAL | Status: DC | PRN

## 2019-08-19 MED ORDER — VANCOMYCIN HCL 10 G IV SOLR
1500.0000 mg | Freq: Once | INTRAVENOUS | Status: DC
  Filled 2019-08-19: qty 1500

## 2019-08-19 MED ORDER — VANCOMYCIN HCL IN DEXTROSE 750-5 MG/150ML-% IV SOLN: 750.0000 mg | INTRAVENOUS | Status: DC

## 2019-08-19 MED ORDER — ONDANSETRON HCL 4 MG PO TABS: 4.0000 mg | ORAL_TABLET | Freq: Four times a day (QID) | ORAL | Status: DC | PRN

## 2019-08-19 MED ORDER — LACTATED RINGERS IV BOLUS
1000.0000 mL | Freq: Once | INTRAVENOUS | Status: AC
  Administered 2019-08-19: 1000 mL via INTRAVENOUS

## 2019-08-19 MED ORDER — ACETAMINOPHEN 650 MG RE SUPP
650.0000 mg | Freq: Once | RECTAL | Status: AC
  Administered 2019-08-19: 650 mg via RECTAL
  Filled 2019-08-19: qty 1

## 2019-08-19 MED ORDER — SODIUM CHLORIDE 0.9 % IV SOLN
500.0000 mg | Freq: Once | INTRAVENOUS | Status: AC
  Administered 2019-08-19: 500 mg via INTRAVENOUS
  Filled 2019-08-19: qty 500

## 2019-08-19 MED ORDER — MIDAZOLAM HCL 2 MG/2ML IJ SOLN
2.0000 mg | INTRAMUSCULAR | Status: DC | PRN
  Filled 2019-08-19: qty 2

## 2019-08-19 NOTE — ED Notes (Signed)
Spoke with sister on phone and gave her an update on pt condition as she is legal guardian

## 2019-08-19 NOTE — ED Notes (Signed)
Nurse getting pt stated she was at lunch and would call back in about 10 minutes

## 2019-08-19 NOTE — Progress Notes (Signed)
   08/19/19 2100  Clinical Encounter Type  Visited With Family;Patient and family together  Visit Type Initial;Spiritual support;Social support;Critical Care;Other (Comment)  Referral From Other (Comment)  Spiritual Encounters  Spiritual Needs Other (Comment)  Stress Factors  Patient Stress Factors None identified  Family Stress Factors Exhausted;Lack of caregivers;Other (Comment)

## 2019-08-19 NOTE — Progress Notes (Signed)
Pharmacy Electrolyte Monitoring Consult:  Pharmacy consulted to assist in monitoring and replacing electrolytes in this 58 y.o. male admitted on 08/19/2019 with sepsis. Patient subsequently intubated upon admission to ICU. Patient's sedation regimen includes continuous fentanyl infusion. Patient with past medical history significant for Down's syndrome, arthritis, and degenerative disc. Patient taking prednisone 40mg  daily as an outpatient.   Labs:  Sodium (mmol/L)  Date Value  08/19/2019 152 (H)  12/06/2015 138  08/26/2014 142   Potassium (mmol/L)  Date Value  08/19/2019 4.3  08/26/2014 3.4 (L)   Calcium (mg/dL)  Date Value  08/19/2019 8.0 (L)   Calcium, Total (mg/dL)  Date Value  08/26/2014 8.0 (L)   Albumin (g/dL)  Date Value  08/19/2019 3.2 (L)  12/06/2015 3.4 (L)  02/04/2012 3.2 (L)    Assessment/Plan: 1. Electrolytes: Patient with hypernatremia. Patient requiring sodium bicarb infusion at 118mL/hr x 6 hours. Free water flushes initiated at 131mL VT Q4hr.   2. Glucose: Patient initiated on hydrocortisone 50mg  IV Q6hr. Will initiate SSI Q4hr.   3. Constipation: Will initiate senna/docusate 2 tabs VT BID.   Pharmacy will continue to monitor and adjust per consult.   Samhitha Rosen L 08/19/2019 4:48 PM

## 2019-08-19 NOTE — Consult Note (Signed)
Name: Joseph Flores MRN:   [id number] DOB:   [birth year]            CONSULTATION DATE: 08/19/2019  REFERRING MD :  Dr. Bridgett Larsson  CHIEF COMPLAINT:  Shortness of breath  SIGNIFICANT EVENTS/STUDIES:  10/8 Presented to ICU with shortness of breath, admitted to ICU with diagnosis of sepsis given elevated lactic acid (7.2), elevated temperature (102), tachypnea, tachycardia, and evidence of bilateral infiltrates on chest X-ray.  SIGNIFICANT EVENTS/STUDIES: 10/8: Pt admitted to the ICU unit on NRB  MICRO DATA: COVID-19 10/8>>negative  Blood x2 10/8>> Urine 10/8>>  HISTORY OF PRESENT ILLNESS: This is a 58 yo male with a PMH of Down's Syndrome, Degeneration of Intervertebral Disc of Lumbar Region, and Arthritis. He presented to [facility] ER via EMS from [facility] on 10/8 with shortness of breath with fever suspected secondary to aspiration according to facility. EMS reported upon their arrival at [facility] they placed pt on NRB with O2 sats increasing to 95%. The pt was also hypotensive, therefore EMS started initiated levophed gtt. In the ER lab results Na+ 152, CO2 20, glucose 147, BUN 41, creatinine 1.72, anion gap 24, AST 61, ALT 82, lactic acid 7.2, and hgb 12.2. COVID-19 negative, however CXR concerning for pneumonia. Vital signs: temp 102.7 F, hr 138, rr 32, and O2 sats 94% on NRB. He met sepsis protocol therefore he received azithromycin, cefepime, clindamycin, and 2L NS bolus. He was subsequently admitted to the stepdown unit per hospitalist team for additional workup and treatment.   Patient with increased WOB and severe shock Severe metabolic acidosis  PAST MEDICAL HISTORY :   has a past medical history of Arthritis, Degeneration of intervertebral disc of lumbar region, and Down's syndrome.  has a past surgical history that includes Leg Surgery (Right); Colonoscopy with propofol (N/A, 09/19/2016); and Knee surgery (Right).        Prior to Admission medications    Medication Sig Start Date End Date Taking? Authorizing Provider  citalopram (CELEXA) 10 MG tablet Take 10 mg by mouth daily. 08/19/19   [provider]  predniSONE (DELTASONE) 20 MG tablet Take 2 tablets (40 mg total) by mouth daily. Patient not taking: Reported on 08/19/2019 02/21/18   Harvest Dark, MD  tamsulosin (FLOMAX) 0.4 MG CAPS capsule Take 0.4 mg by mouth daily. 08/19/19   [provider]  traMADol (ULTRAM) 50 MG tablet Take 1 tablet (50 mg total) by mouth every 12 (twelve) hours as needed. Patient not taking: Reported on 11/18/2017 09/24/16   Roselee Nova, MD  Vitamin D, Ergocalciferol, (DRISDOL) 50000 units CAPS capsule Take 1 capsule (50,000 Units total) by mouth once a week. For 12 weeks Patient not taking: Reported on 11/18/2017 05/23/17   Roselee Nova, MD   No Known Allergies  FAMILY HISTORY:  family history includes Cancer in his brother and mother; Healthy in his sister, sister, and sister; Hypertension in his father and mother; Kidney disease in his mother; Rheum arthritis in his sister. SOCIAL HISTORY:  reports that he has never smoked. He has never used smokeless tobacco. He reports that he does not drink alcohol or use drugs.  REVIEW OF SYSTEMS:   Unable to obtain due to critical illness   VITAL SIGNS: Temp:  [102.7 F (39.3 C)] 102.7 F (39.3 C) (10/08 1052) Pulse Rate:  [123-138] 123 (10/08 1230) Resp:  [32-33] 33 (10/08 1215) BP: (93-96)/(54-68) 94/68 (10/08 1230) SpO2:  [93 %-96 %] 93 % (10/08 1230)  Weight:  [62.2 kg] 62.2 kg (10/08 1054)   No intake/output data recorded. Total I/O In: 350 [IV Piggyback:350] Out: -    SpO2: 93 % O2 Flow Rate (L/min): 10 L/min   Physical Examination:  GENERAL:critically ill appearing, +resp distress HEAD: Normocephalic, atraumatic.  EYES: Pupils equal, round, reactive to light.  No scleral icterus.  MOUTH: Moist mucosal membrane. NECK: Supple. No JVD.  PULMONARY:  +rhonchi, +wheezing CARDIOVASCULAR: S1 and S2. Regular rate and rhythm. No murmurs, rubs, or gallops.  GASTROINTESTINAL: Soft, nontender, -distended. No masses. Positive bowel sounds. No hepatosplenomegaly.  MUSCULOSKELETAL: No swelling, clubbing, or edema.  NEUROLOGIC: obtunded SKIN:intact,warm,dry  MEDICATIONS: I have reviewed all medications and confirmed regimen as documented   CULTURE RESULTS          Recent Results (from the past 240 hour(s))  SARS Coronavirus 2 [facility] order, Performed in [facility] hospital lab) Nasopharyngeal Nasopharyngeal Swab     Status: None   Collection Time: 08/19/19 10:56 AM   Specimen: Nasopharyngeal Swab  Result Value Ref Range Status   SARS Coronavirus 2 NEGATIVE NEGATIVE Final    Comment: (NOTE) If result is NEGATIVE SARS-CoV-2 target nucleic acids are NOT DETECTED. The SARS-CoV-2 RNA is generally detectable in upper and lower  respiratory specimens during the acute phase of infection. The lowest  concentration of SARS-CoV-2 viral copies this assay can detect is 250  copies / mL. A negative result does not preclude SARS-CoV-2 infection  and should not be used as the sole basis for treatment or other  patient management decisions.  A negative result may occur with  improper specimen collection / handling, submission of specimen other  than nasopharyngeal swab, presence of viral mutation(s) within the  areas targeted by this assay, and inadequate number of viral copies  (<250 copies / mL). A negative result must be combined with clinical  observations, patient history, and epidemiological information. If result is POSITIVE SARS-CoV-2 target nucleic acids are DETECTED. The SARS-CoV-2 RNA is generally detectable in upper and lower  respiratory specimens dur ing the acute phase of infection.  Positive  results are indicative of active infection with SARS-CoV-2.  Clinical  correlation with patient history and other diagnostic  information is  necessary to determine patient infection status.  Positive results do  not rule out bacterial infection or co-infection with other viruses. If result is PRESUMPTIVE POSTIVE SARS-CoV-2 nucleic acids MAY BE PRESENT.   A presumptive positive result was obtained on the submitted specimen  and confirmed on repeat testing.  While 2019 novel coronavirus  (SARS-CoV-2) nucleic acids may be present in the submitted sample  additional confirmatory testing may be necessary for epidemiological  and / or clinical management purposes  to differentiate between  SARS-CoV-2 and other Sarbecovirus currently known to infect humans.  If clinically indicated additional testing with an alternate test  methodology [phone]) is advised. The SARS-CoV-2 RNA is generally  detectable in upper and lower respiratory sp ecimens during the acute  phase of infection. The expected result is Negative. Fact Sheet for Patients:  StrictlyIdeas.no Fact Sheet for Healthcare Providers: BankingDealers.co.za This test is not yet approved or cleared by the [place] FDA and has been authorized for detection and/or diagnosis of SARS-CoV-2 by FDA under an Emergency Use Authorization (EUA).  This EUA will remain in effect (meaning this test can be used) for the duration of the COVID-19 declaration under Section 564(b)(1) of the Act, 21 U.S.C. section 360bbb-3(b)(1), unless the authorization is terminated or revoked sooner. Performed  at [facility], [address] [place], [place] [address]           IMAGING    Dg Chest Port 1 View  Result Date: 08/19/2019 CLINICAL DATA:  58 year old male with fever and shortness of breath. Requiring oxygen. Hypotensive. Test for COVID-19 pending. EXAM: PORTABLE CHEST 1 VIEW COMPARISON:  Portable chest. 05/20/2018 and earlier FINDINGS: Portable AP upright view at 1110 hours. Confluent bilateral pulmonary  opacity, maximal at the right lower lobe. Larger lung volumes compared to 2019. Mediastinal contours remain normal. Visualized tracheal air column is within normal limits. No superimposed pneumothorax, pulmonary edema or pleural effusion. No acute osseous abnormality identified. Paucity of bowel gas in the upper abdomen. IMPRESSION: Confluent bilateral pulmonary opacities. Top differential considerations are pneumonia and aspiration. No pleural effusion. Electronically Signed   By: Genevie Ann M.D.   On: 08/19/2019 11:41     ASSESSMENT AND PLAN SYNOPSIS 58 yo male with Down Syndrome admitted to the ICU for acute hypoxic respiratory failure secondary to bilateral pneumonia and septic shock.  Severe ACUTE Hypoxic and Hypercapnic Respiratory Failure -continue oxygen supplementation with NRB -continue Bronchodilator Therapy Very high risk for intubation and cardiac arrest Sister called and notified  SHOCK-SEPSIS -use vasopressors to keep MAP>65 -follow ABG and LA -follow up cultures -emperic ABX  -start stress dose steroids -aggressive IV fluid resuscitation  ACUTE KIDNEY INJURY/Renal Failure -follow chem 7 -follow UO -continue Foley Catheter-assess need -Avoid nephrotoxic agents  CARDIAC ICU monitoring  ID -continue IV abx as prescibed -follow up cultures Check legionalle AG and strep pneumonia AG Atypical antigen tests  empiric abx Check HIV   GI  GI PROPHYLAXIS as indicated  ENDO - will use ICU hypoglycemic\Hyperglycemia protocol if needed  ELECTROLYTES -follow labs as needed -replace as needed -pharmacy consultation and following  DVT/GI PRX ordered TRANSFUSIONS AS NEEDED MONITOR FSBS ASSESS the need for LABS    Critical Care Time devoted to patient care services described in this note is 35 minutes.   Overall, patient is critically ill, prognosis is guarded.  Patient with Multiorgan failure and at high risk for cardiac arrest and death.   Very  poor prognosis and baseline function End stage dementia with severe cognitive dysfunction Sister has been updated   [name], M.D.  [facility]  Medical Director [place] Director [facility] Cardio-Pulmonary Department

## 2019-08-19 NOTE — Procedures (Signed)
Central Venous Catheter Placement:TRIPLE LUMEN Indication: Patient receiving vesicant or irritant drug.; Patient receiving intravenous therapy for longer than 5 days.; Patient has limited or no vascular access.   Consent:emergent    Hand washing performed prior to starting the procedure.   Procedure:   An active timeout was performed and correct patient, name, & ID confirmed.   Patient was positioned correctly for central venous access.  Patient was prepped using strict sterile technique including chlorohexadine preps, sterile drape, sterile gown and sterile gloves.    The area was prepped, draped and anesthetized in the usual sterile manner. Patient comfort was obtained.    A triple lumen catheter was placed in LEFT Internal Jugular Vein There was good blood return, catheter caps were placed on lumens, catheter flushed easily, the line was secured and a sterile dressing and BIO-PATCH applied.   Ultrasound was used to visualize vasculature and guidance of needle.   Number of Attempts: 1 Complications:none Estimated Blood Loss: none Chest Radiograph indicated and ordered.  Operator: Jennfer Gassen.   Olisa Quesnel David Jennilee Demarco, M.D.   Pulmonary & Critical Care Medicine  Medical Director ICU-[facility] Director [facility] Cardio-Pulmonary Department     

## 2019-08-19 NOTE — Progress Notes (Signed)
ETT pulled back 2cm per MD request based on CXR results. ETT now 20 @ lip

## 2019-08-19 NOTE — ED Notes (Signed)
Dr Cinda Quest notified of lactic 7.2

## 2019-08-19 NOTE — Progress Notes (Signed)
Pharmacy Antibiotic Note  Joseph Flores is a 58 y.o. male admitted on 08/19/2019 with pneumonia.  Patient resides at [facility] and has history significant for Down's syndrome, arthritis, and degenerative disc. Patient with elevated PCT. Pharmacy has been consulted for vancomycin and cefepime dosing.  Plan: Cefepime 2g IV Q12hr.   Vancomycin 1515m IV x 1 followed by vancomycin 7520mIV Q24hr. Will discontinue vancomycin if MRSA PCR is negative.   Azithromycin 50042mV Q24hr x 5 doses.   Height: _0  (170.2 cm) Weight: 137 lb 3.2 oz (62.2 kg) IBW/kg (Calculated) : 66.1  Temp (24hrs), Avg:102.7 F (39.3 C), Min:102.7 F (39.3 C), Max:102.7 F (39.3 C)  Recent Labs  Lab 08/19/19 1106 08/19/19 1345 08/19/19 1445  WBC 8.7  --   --   CREATININE 1.72*  --  1.81*  LATICACIDVEN 7.2* 8.7*  --     Estimated Creatinine Clearance: 39.6 mL/min (A) (by C-G formula based on SCr of 1.81 mg/dL (H)).    No Known Allergies  Antimicrobials this admission: Azithromycin 10/08 >> 10/12 Cefepime 10/08 >>  Vancomycin 10/08 >> Clindamycin 10/08 x 1.   Dose adjustments this admission: N/A  Microbiology results: 10/08 BCx: pending  10/08 UCx: pending  10/08 BAL: negative  10/08 MRSA PCR: pending  10/08 COVID: negative   Thank you for allowing pharmacy to be a part of this patient's care.  Simpson,Michael L 08/19/2019 5:01 PM

## 2019-08-19 NOTE — Progress Notes (Signed)
CODE SEPSIS - PHARMACY COMMUNICATION  **Broad Spectrum Antibiotics should be administered within 1 hour of Sepsis diagnosis**  Time Code Sepsis Called/Page Received: 1105  Antibiotics Ordered: Azithromycin/Cefepime  Time of 1st antibiotic administration: 1131 Cefepime   Additional action taken by pharmacy: none indicated   If necessary, Name of Provider/Nurse Contacted: N/A   Simpson,Michael L RPh 08/19/2019  11:29 AM

## 2019-08-19 NOTE — Consult Note (Deleted)
Name: Joseph Flores MRN: [id number] DOB: [birth year]     CONSULTATION DATE: 08/19/2019  REFERRING MD :  Dr. Bridgett Larsson  CHIEF COMPLAINT:  Shortness of breath  SIGNIFICANT EVENTS/STUDIES:  10/8 Presented to ICU with shortness of breath, admitted to ICU with diagnosis of sepsis given elevated lactic acid (7.2), elevated temperature (102), tachypnea, tachycardia, and evidence of bilateral infiltrates on chest X-ray.  SIGNIFICANT EVENTS/STUDIES:  10/8: Pt admitted to the stepdown unit on NRB  MICRO DATA: COVID-19 10/8>>negative  Blood x2 10/8>> Urine 10/8>>  HISTORY OF PRESENT ILLNESS:   This is a 58 yo male with a PMH of Down's Syndrome, Degeneration of Intervertebral Disc of Lumbar Region, and Arthritis.  He presented to [facility] ER via EMS from [facility] on 10/8 with shortness of breath with fever suspected secondary to aspiration according to facility.  EMS reported upon their arrival at [facility] they placed pt on NRB with O2 sats increasing to 95%. The pt was also hypotensive, therefore EMS started initiated levophed gtt.  In the ER lab results Na+ 152, CO2 20, glucose 147, BUN 41, creatinine 1.72, anion gap 24, AST 61, ALT 82, lactic acid 7.2, and hgb 12.2.  COVID-19 negative, however CXR concerning for pneumonia.  Vital signs: temp 102.7 F, hr 138, rr 32, and O2 sats 94% on NRB.  He met sepsis protocol therefore he received azithromycin, cefepime, clindamycin, and 2L NS bolus.  He was subsequently admitted to the stepdown unit per hospitalist team for additional workup and treatment.   PAST MEDICAL HISTORY :   has a past medical history of Arthritis, Degeneration of intervertebral disc of lumbar region, and Down's syndrome.  has a past surgical history that includes Leg Surgery (Right); Colonoscopy with propofol (N/A, 09/19/2016); and Knee surgery (Right). Prior to Admission medications   Medication Sig Start Date End Date Taking? Authorizing Provider  citalopram (CELEXA) 10  MG tablet Take 10 mg by mouth daily. 08/19/19   [provider]  predniSONE (DELTASONE) 20 MG tablet Take 2 tablets (40 mg total) by mouth daily. Patient not taking: Reported on 08/19/2019 02/21/18   Harvest Dark, MD  tamsulosin (FLOMAX) 0.4 MG CAPS capsule Take 0.4 mg by mouth daily. 08/19/19   [provider]  traMADol (ULTRAM) 50 MG tablet Take 1 tablet (50 mg total) by mouth every 12 (twelve) hours as needed. Patient not taking: Reported on 11/18/2017 09/24/16   Roselee Nova, MD  Vitamin D, Ergocalciferol, (DRISDOL) 50000 units CAPS capsule Take 1 capsule (50,000 Units total) by mouth once a week. For 12 weeks Patient not taking: Reported on 11/18/2017 05/23/17   Roselee Nova, MD   No Known Allergies  FAMILY HISTORY:  family history includes Cancer in his brother and mother; Healthy in his sister, sister, and sister; Hypertension in his father and mother; Kidney disease in his mother; Rheum arthritis in his sister. SOCIAL HISTORY:  reports that he has never smoked. He has never used smokeless tobacco. He reports that he does not drink alcohol or use drugs.  REVIEW OF SYSTEMS:   Unable to obtain due to critical illness   Review of Systems:  Gen:  Denies  fever, sweats, chills weight loss  HEENT: Denies blurred vision, double vision, ear pain, eye pain, hearing loss, nose bleeds, sore throat Cardiac:  No dizziness, chest pain or heaviness, chest tightness,edema, No JVD Resp:   No cough, -sputum production, -shortness of breath,-wheezing, -hemoptysis,  Gi: Denies swallowing difficulty, stomach  pain, nausea or vomiting, diarrhea, constipation, bowel incontinence Gu:  Denies bladder incontinence, burning urine Ext:   Denies Joint pain, stiffness or swelling Skin: Denies  skin rash, easy bruising or bleeding or hives Endoc:  Denies polyuria, polydipsia , polyphagia or weight change Psych:   Denies depression, insomnia or hallucinations  Other:  All other systems  negative   VITAL SIGNS: Temp:  [102.7 F (39.3 C)] 102.7 F (39.3 C) (10/08 1052) Pulse Rate:  [123-138] 123 (10/08 1230) Resp:  [32-33] 33 (10/08 1215) BP: (93-96)/(54-68) 94/68 (10/08 1230) SpO2:  [93 %-96 %] 93 % (10/08 1230) Weight:  [62.2 kg] 62.2 kg (10/08 1054)   No intake/output data recorded. Total I/O In: 350 [IV Piggyback:350] Out: -    SpO2: 93 % O2 Flow Rate (L/min): 10 L/min   Physical Examination:  GENERAL:critically ill appearing, +resp distress HEAD: Normocephalic, atraumatic.  EYES: Pupils equal, round, reactive to light.  No scleral icterus.  MOUTH: Moist mucosal membrane. NECK: Supple. No JVD.  PULMONARY: +rhonchi, +wheezing CARDIOVASCULAR: S1 and S2. Regular rate and rhythm. No murmurs, rubs, or gallops.  GASTROINTESTINAL: Soft, nontender, -distended. No masses. Positive bowel sounds. No hepatosplenomegaly.  MUSCULOSKELETAL: No swelling, clubbing, or edema.  NEUROLOGIC: obtunded SKIN:intact,warm,dry  MEDICATIONS: I have reviewed all medications and confirmed regimen as documented   CULTURE RESULTS   Recent Results (from the past 240 hour(s))  SARS Coronavirus 2 [facility] order, Performed in [facility] hospital lab) Nasopharyngeal Nasopharyngeal Swab     Status: None   Collection Time: 08/19/19 10:56 AM   Specimen: Nasopharyngeal Swab  Result Value Ref Range Status   SARS Coronavirus 2 NEGATIVE NEGATIVE Final    Comment: (NOTE) If result is NEGATIVE SARS-CoV-2 target nucleic acids are NOT DETECTED. The SARS-CoV-2 RNA is generally detectable in upper and lower  respiratory specimens during the acute phase of infection. The lowest  concentration of SARS-CoV-2 viral copies this assay can detect is 250  copies / mL. A negative result does not preclude SARS-CoV-2 infection  and should not be used as the sole basis for treatment or other  patient management decisions.  A negative result may occur with  improper specimen collection / handling,  submission of specimen other  than nasopharyngeal swab, presence of viral mutation(s) within the  areas targeted by this assay, and inadequate number of viral copies  (<250 copies / mL). A negative result must be combined with clinical  observations, patient history, and epidemiological information. If result is POSITIVE SARS-CoV-2 target nucleic acids are DETECTED. The SARS-CoV-2 RNA is generally detectable in upper and lower  respiratory specimens dur ing the acute phase of infection.  Positive  results are indicative of active infection with SARS-CoV-2.  Clinical  correlation with patient history and other diagnostic information is  necessary to determine patient infection status.  Positive results do  not rule out bacterial infection or co-infection with other viruses. If result is PRESUMPTIVE POSTIVE SARS-CoV-2 nucleic acids MAY BE PRESENT.   A presumptive positive result was obtained on the submitted specimen  and confirmed on repeat testing.  While 2019 novel coronavirus  (SARS-CoV-2) nucleic acids may be present in the submitted sample  additional confirmatory testing may be necessary for epidemiological  and / or clinical management purposes  to differentiate between  SARS-CoV-2 and other Sarbecovirus currently known to infect humans.  If clinically indicated additional testing with an alternate test  methodology [phone]) is advised. The SARS-CoV-2 RNA is generally  detectable in upper and lower respiratory  sp ecimens during the acute  phase of infection. The expected result is Negative. Fact Sheet for Patients:  StrictlyIdeas.no Fact Sheet for Healthcare Providers: BankingDealers.co.za This test is not yet approved or cleared by the [place] FDA and has been authorized for detection and/or diagnosis of SARS-CoV-2 by FDA under an Emergency Use Authorization (EUA).  This EUA will remain in effect (meaning this test can be  used) for the duration of the COVID-19 declaration under Section 564(b)(1) of the Act, 21 U.S.C. section 360bbb-3(b)(1), unless the authorization is terminated or revoked sooner. Performed at [facility], [address], [place], [place] [address]           IMAGING    Dg Chest Port 1 View  Result Date: 08/19/2019 CLINICAL DATA:  59 year old male with fever and shortness of breath. Requiring oxygen. Hypotensive. Test for COVID-19 pending. EXAM: PORTABLE CHEST 1 VIEW COMPARISON:  Portable chest. 05/20/2018 and earlier FINDINGS: Portable AP upright view at 1110 hours. Confluent bilateral pulmonary opacity, maximal at the right lower lobe. Larger lung volumes compared to 2019. Mediastinal contours remain normal. Visualized tracheal air column is within normal limits. No superimposed pneumothorax, pulmonary edema or pleural effusion. No acute osseous abnormality identified. Paucity of bowel gas in the upper abdomen. IMPRESSION: Confluent bilateral pulmonary opacities. Top differential considerations are pneumonia and aspiration. No pleural effusion. Electronically Signed   By: Genevie Ann M.D.   On: 08/19/2019 11:41        Indwelling Urinary Catheter continued, requirement due to   Reason to continue Indwelling Urinary Catheter strict Intake/Output monitoring for hemodynamic instability   Central Line/ continued, requirement due to  Reason to continue [place] of central venous pressure or other hemodynamic parameters and poor IV access   Ventilator continued, requirement due to severe respiratory failure   Ventilator Sedation RASS 0 to -2      ASSESSMENT AND PLAN SYNOPSIS   Severe ACUTE Hypoxic and Hypercapnic Respiratory Failure -continue Full MV support -continue Bronchodilator Therapy -Wean Fio2 and PEEP as tolerated -will perform SAT/SBT when respiratory parameters are met   ACUTE SYSTOLIC CARDIAC FAILURE- EF -oxygen as needed -Lasix as tolerated  -follow up cardiac enzymes as indicated -follow up cardiology recs   SEVERE COPD EXACERBATION -continue IV steroids as prescribed -continue NEB THERAPY as prescribed -morphine as needed -wean fio2 as needed and tolerated   ACUTE KIDNEY INJURY/Renal Failure -follow chem 7 -follow UO -continue Foley Catheter-assess need -Avoid nephrotoxic agents -Renal ultrasound  NEUROLOGY - intubated and sedated - minimal sedation to achieve a RASS goal: -1 Wake up assessment pending   SHOCK-SEPSIS/HYPOVOLUMIC/CARDIOGENIC -use vasopressors to keep MAP>65 -follow ABG and LA -follow up cultures -emperic ABX -consider stress dose steroids -aggressive IV fluid resuscitation  CARDIAC ICU monitoring  ID -continue IV abx as prescibed -follow up cultures  GI GI PROPHYLAXIS as indicated  NUTRITIONAL STATUS DIET-->TF's as tolerated Constipation protocol as indicated   ENDO - will use ICU hypoglycemic\Hyperglycemia protocol if needed    ELECTROLYTES -follow labs as needed -replace as needed -pharmacy consultation and following   DVT/GI PRX ordered TRANSFUSIONS AS NEEDED MONITOR FSBS ASSESS the need for LABS    Overall, patient is critically ill, prognosis is guarded.  Patient with Multiorgan failure and at high risk for cardiac arrest and death.    [name], M.D.  [facility]  Medical Director [place] Director [facility] Cardio-Pulmonary Department

## 2019-08-19 NOTE — Procedures (Signed)
  PROCEDURE: BRONCHOSCOPY Therapeutic Aspiration of Tracheobronchial Tree  PROCEDURE DATE: 08/19/2019  TIME:  NAME:  Joseph Flores  DOB:12/13/1960  MRN: [id number] LOC:  IC11A/IC11A-AA     CODE STATUS:      Code Status Orders  (From admission, onward)         Start     Ordered   08/19/19 1337  Full code  Continuous     08/19/19 1336        Code Status History    This patient has a current code status but no historical code status.   Advance Care Planning Activity       Indications/Preliminary Diagnosis: Resp failure/pneumonia  Consent: (Place X beside choice/s below)  The benefits, risks and possible complications of the procedure were        explained to:  ___ patient  _x__ patient's family  ___ other:___________  who verbalized understanding and gave:  _x__ verbal  ___ written  ___ verbal and written  ___ telephone  ___ other:________ consent.      Unable to obtain consent; procedure performed on emergent basis.     Other:       PRESEDATION ASSESSMENT: History and Physical has been performed. Patient meds and allergies have been reviewed. Presedation airway examination has been performed and documented. Baseline vital signs, sedation score, oxygenation status, and cardiac rhythm were reviewed. Patient was deemed to be in satisfactory condition to undergo the procedure.     PROCEDURE DETAILS: Timeout performed and correct patient, name, & ID confirmed. Following prep per Pulmonary policy, appropriate sedation was administered. The Bronchoscope was inserted in to oral cavity with bite block in place. Therapeutic aspiration of Tracheobronchial tree was performed.  Airway exam proceeded with findings, technical procedures, and specimen collection as noted below. At the end of exam the scope was withdrawn without incident. Impression and Plan as noted below.      Airway Prep (Place X beside choice below)   1% Transtracheal Lidocaine Anesthetization 7 cc   Patient  prepped per Bronchoscopy Lab Policy       Insertion Route (Place X beside choice below)   Nasal   Oral  x Endotracheal Tube   Tracheostomy    TECHNICAL PROCEDURES: (Place X beside choice below)   Procedures  Description    None     Electrocautery     Cryotherapy     Balloon Dilatation     Bronchography     Stent Placement   x  Therapeutic Aspiration Thick, mucopurulent secretions were extracted     Laser/Argon Plasma    Brachytherapy Catheter Placement    Foreign Body Removal         SPECIMENS (Sites): (Place X beside choice below)  Specimens Description   No Specimens Obtained     Washings   x Lavage 30 cc's instilled and approx 20 cc's retrieved, cloudy and purulant   Biopsies    Fine Needle Aspirates    Brushings    Sputum    FINDINGS: extensive amounts of purulent secreiotns ESTIMATED BLOOD LOSS: none COMPLICATIONS/RESOLUTION: none   IMPRESSION:POST-PROCEDURE DX:  multilobar pneumonia   RECOMMENDATION/PLAN:   Continue IV ABX Continue vent support   Joseph Flores, M.D.  [facility]  Medical Director [place] Director Joseph Flores Department

## 2019-08-19 NOTE — Procedures (Signed)
Endotracheal Intubation: Patient required placement of an artificial airway secondary to Respiratory Failure  Consent: Emergent.   Hand washing performed prior to starting the procedure.   Medications administered for sedation prior to procedure:  Midazolam 4 mg IV,  ROCuronium 50 mg IV, Fentanyl 100 mcg IV.    A time out procedure was called and correct patient, name, & ID confirmed. Needed supplies and equipment were assembled and checked to include ETT, 10 ml syringe, Glidescope, Mac and Miller blades, suction, oxygen and bag mask valve, end tidal CO2 monitor.   Patient was positioned to align the mouth and pharynx to facilitate visualization of the glottis.   Heart rate, SpO2 and blood pressure was continuously monitored during the procedure. Pre-oxygenation was conducted prior to intubation and endotracheal tube was placed through the vocal cords into the trachea.     The artificial airway was placed under direct visualization via glidescope route using a 8.0 ETT on the first attempt.  ETT was secured at 23 cm mark.  Placement was confirmed by auscuitation of lungs with good breath sounds bilaterally and no stomach sounds.  Condensation was noted on endotracheal tube.   Pulse ox 98%.  CO2 detector in place with appropriate color change.   Complications: None .   Operator: Shamiah Kahler.   Chest radiograph ordered and pending.   Comments: OGT placed via glidescope.  [name], M.D.  [facility]  Medical Director [place] Director [facility] Cardio-Pulmonary Department

## 2019-08-19 NOTE — ED Notes (Signed)
Report given to Jamie, RN.
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
## 2019-08-19 NOTE — ED Triage Notes (Signed)
Pt arrives via EMS form [facility] for difficulty breathing- pt does not typically wear oxygen- EMS placed him on 10L via nonrebreather and got O2 sats to 95%- per facility they think he has aspirated- pt received levophed for low BP approximately 125 mcg- CBG 177- last bp per ems 97/78

## 2019-08-19 NOTE — ED Notes (Signed)
Dr. Malinda at bedside.  

## 2019-08-20 ENCOUNTER — Inpatient Hospital Stay: Payer: Medicare Other

## 2019-08-20 ENCOUNTER — Encounter: Payer: Self-pay | Admitting: Primary Care

## 2019-08-20 DIAGNOSIS — Z7189 Other specified counseling: Secondary | ICD-10-CM

## 2019-08-20 DIAGNOSIS — L8990 Pressure ulcer of unspecified site, unspecified stage: Secondary | ICD-10-CM | POA: Insufficient documentation

## 2019-08-20 DIAGNOSIS — Z515 Encounter for palliative care: Secondary | ICD-10-CM

## 2019-08-20 LAB — CBC
HCT: 29.8 % — ABNORMAL LOW (ref 39.0–52.0)
Hemoglobin: 9 g/dL — ABNORMAL LOW (ref 13.0–17.0)
MCH: 29.5 pg (ref 26.0–34.0)
MCHC: 30.2 g/dL (ref 30.0–36.0)
MCV: 97.7 fL (ref 80.0–100.0)
Platelets: 248 10*3/uL (ref 150–400)
RBC: 3.05 MIL/uL — ABNORMAL LOW (ref 4.22–5.81)
RDW: 17.4 % — ABNORMAL HIGH (ref 11.5–15.5)
WBC: 17.3 10*3/uL — ABNORMAL HIGH (ref 4.0–10.5)
nRBC: 0 % (ref 0.0–0.2)

## 2019-08-20 LAB — COMPREHENSIVE METABOLIC PANEL
ALT: 62 U/L — ABNORMAL HIGH (ref 0–44)
AST: 38 U/L (ref 15–41)
Albumin: 2.6 g/dL — ABNORMAL LOW (ref 3.5–5.0)
Alkaline Phosphatase: 40 U/L (ref 38–126)
Anion gap: 12 (ref 5–15)
BUN: 30 mg/dL — ABNORMAL HIGH (ref 6–20)
CO2: 27 mmol/L (ref 22–32)
Calcium: 7.9 mg/dL — ABNORMAL LOW (ref 8.9–10.3)
Chloride: 110 mmol/L (ref 98–111)
Creatinine, Ser: 1.13 mg/dL (ref 0.61–1.24)
GFR calc Af Amer: >60 mL/min (ref >60)
GFR calc non Af Amer: >60 mL/min (ref >60)
Glucose, Bld: 175 mg/dL — ABNORMAL HIGH (ref 70–99)
Potassium: 4.5 mmol/L (ref 3.5–5.1)
Sodium: 149 mmol/L — ABNORMAL HIGH (ref 135–145)
Total Bilirubin: 0.7 mg/dL (ref 0.3–1.2)
Total Protein: 6.3 g/dL — ABNORMAL LOW (ref 6.5–8.1)

## 2019-08-20 LAB — URINE CULTURE: Culture: NO GROWTH

## 2019-08-20 LAB — BLOOD GAS, ARTERIAL
Acid-Base Excess: 3.4 mmol/L — ABNORMAL HIGH (ref 0.0–2.0)
Bicarbonate: 29.6 mmol/L — ABNORMAL HIGH (ref 20.0–28.0)
FIO2: 0.7
MECHVT: 450 mL
O2 Saturation: 99.8 %
PEEP: 7 cmH2O
Patient temperature: 37
RATE: 16 resp/min
pCO2 arterial: 50 mmHg — ABNORMAL HIGH (ref 32.0–48.0)
pH, Arterial: 7.38 (ref 7.350–7.450)
pO2, Arterial: 244 mmHg — ABNORMAL HIGH (ref 83.0–108.0)

## 2019-08-20 LAB — GLUCOSE, CAPILLARY
Glucose-Capillary: 125 mg/dL — ABNORMAL HIGH (ref 70–99)
Glucose-Capillary: 155 mg/dL — ABNORMAL HIGH (ref 70–99)
Glucose-Capillary: 138 mg/dL — ABNORMAL HIGH (ref 70–99)

## 2019-08-20 LAB — HEMOGLOBIN A1C
Hgb A1c MFr Bld: 5.7 % — ABNORMAL HIGH (ref 4.8–5.6)
Mean Plasma Glucose: 116.89 mg/dL

## 2019-08-20 LAB — PHOSPHORUS: Phosphorus: 2.9 mg/dL (ref 2.5–4.6)

## 2019-08-20 LAB — LACTIC ACID, PLASMA
Lactic Acid, Venous: 3.9 mmol/L (ref 0.5–1.9)
Lactic Acid, Venous: 3.1 mmol/L (ref 0.5–1.9)

## 2019-08-20 LAB — PROCALCITONIN: Procalcitonin: 37.72 ng/mL

## 2019-08-20 LAB — HIV ANTIBODY (ROUTINE TESTING W REFLEX): HIV Screen 4th Generation wRfx: NONREACTIVE

## 2019-08-20 LAB — MAGNESIUM: Magnesium: 1.9 mg/dL (ref 1.7–2.4)

## 2019-08-20 MED ORDER — INSULIN ASPART 100 UNIT/ML ~~LOC~~ SOLN
0.0000 [IU] | SUBCUTANEOUS | Status: DC
  Administered 2019-08-20: 1 [IU] via SUBCUTANEOUS
  Administered 2019-08-20: 2 [IU] via SUBCUTANEOUS
  Administered 2019-08-20 – 2019-08-21 (×3): 1 [IU] via SUBCUTANEOUS
  Administered 2019-08-21: 2 [IU] via SUBCUTANEOUS
  Administered 2019-08-21 – 2019-08-22 (×5): 1 [IU] via SUBCUTANEOUS
  Filled 2019-08-20 (×10): qty 1

## 2019-08-20 MED ORDER — VITAL AF 1.2 CAL PO LIQD
1000.0000 mL | ORAL | Status: DC
  Administered 2019-08-20: 1000 mL

## 2019-08-20 NOTE — Progress Notes (Signed)
ETT pulled back to 23 per NP order from CXR results

## 2019-08-20 NOTE — Progress Notes (Signed)
ETT with Sign Leak, Balloon and ETT dislodged above the vocal cords I used glide scope to repositioned the ETT back into the trach.  Leak resolved, patient with adequate ventilation    [name], M.D.  [facility]  Medical Director [place] Director [facility] Cardio-Pulmonary Department

## 2019-08-20 NOTE — Progress Notes (Addendum)
Pharmacy Electrolyte Monitoring Consult:  Pharmacy consulted to assist in monitoring and replacing electrolytes in this 58 y.o. male admitted on 08/19/2019 with sepsis. Patient subsequently intubated upon admission to ICU. Patient's sedation regimen includes continuous fentanyl infusion. Patient with past medical history significant for Down's syndrome, arthritis, and degenerative disc. Patient taking prednisone 40mg  daily as an outpatient.   Labs:  Sodium (mmol/L)  Date Value  08/20/2019 149 (H)  12/06/2015 138  08/26/2014 142   Potassium (mmol/L)  Date Value  08/20/2019 4.5  08/26/2014 3.4 (L)   Magnesium (mg/dL)  Date Value  08/20/2019 1.9   Phosphorus (mg/dL)  Date Value  08/20/2019 2.9   Calcium (mg/dL)  Date Value  08/20/2019 7.9 (L)   Calcium, Total (mg/dL)  Date Value  08/26/2014 8.0 (L)   Albumin (g/dL)  Date Value  08/20/2019 2.6 (L)  12/06/2015 3.4 (L)  02/04/2012 3.2 (L)  Corrected calcium: 9 mg/dl  Assessment/Plan: 1. Electrolytes: Patient with hypernatremia, sodium value trending down. Sodium bicarb drip discontinued yesterday. Currently on D5LR @ 75 mL/hr. -Continue free water flushes at 161mL VT Q4hr.  -BMP, Mg, Phos tomorrow AM  2. Glucose: Patient with no history of diabetes. Last A1c from 06/2017 was 6.0 (pre-diabetes). Patient is on stress dose steroids. Insulin therapy with SSI Q4hr. Glucose trending up -Continue to monitor glucose trend -Consider adding basal insulin if glucose continues to trend up -Continue to assess need for steroids  3. Constipation: LBM 10/8. Patient is starting tube feeds today. Constipating medication includes Fentanyl  -Continue senna/docusate VT BID.  -Monitor for loose stools while on ABX.    Pharmacy will continue to monitor and adjust per consult.   Sallye Lat 08/20/2019 1:05 PM

## 2019-08-20 NOTE — Progress Notes (Signed)
Initial Nutrition Assessment  DOCUMENTATION CODES:   Underweight  INTERVENTION:   Initiate Vital 1.2 @50ml /hr  Free water flushes 47ml q4 hours to maintain tube patency   Regimen provides 1440kcal/day, 90g/day protein, 1181ml/day free water  Suspect pt is at high refeed risk; recommend monitor K, Mg and P labs for 3 days after tube feed initiation   NUTRITION DIAGNOSIS:   Inadequate oral intake related to inability to eat(pt sedated and ventilated) as evidenced by NPO status.  GOAL:   Provide needs based on ASPEN/SCCM guidelines  MONITOR:   Vent status, Labs, Weight trends, Skin, I & O's  REASON FOR ASSESSMENT:   Consult Enteral/tube feeding initiation and management  ASSESSMENT:   57 yo male with a PMH of Down's Syndrome, Degeneration of Intervertebral Disc of Lumbar Region, and Arthritis.  He presented to [facility] ER via EMS from [facility] on 10/8 with shortness of breath with fever and found to have aspiration PNA and septic shock   Pt sedated and ventilated. OGT in place. Per chart, pt appears to have lost 24lbs(17%) over the past 9 months; this is significant if admit weight is correct. RD suspects pt with poor appetite and oral intake at baseline. Plan is to initiate tube feeds today. Pt is likely at high refeed risk.   Medications reviewed and include: celexa, colace, fentanyl, senokot, azithromycin, cefepime, LRS w/ 5% dextrose @75ml /hr, pepcid, levophed   Labs reviewed: Na 149(H), P 2.9 wnl, Mg 1.9 wnl Wbc- 17.3(H), Hgb 9.0(L), Hct 29.8(L)  Patient is currently intubated on ventilator support MV: 4.6 L/min Temp (24hrs), Avg:98.6 F (37 C), Min:97.5 F (36.4 C), Max:99.9 F (37.7 C)  Propofol: none  MAP- >81mmHg  NUTRITION - FOCUSED PHYSICAL EXAM:    Most Recent Value  Orbital Region  No depletion  Upper Arm Region  Mild depletion  Thoracic and Lumbar Region  No depletion  Buccal Region  No depletion  Temple Region  Mild depletion  Clavicle  Bone Region  Mild depletion  Clavicle and Acromion Bone Region  Mild depletion  Scapular Bone Region  Mild depletion  Dorsal Hand  Mild depletion  Patellar Region  Severe depletion  Anterior Thigh Region  Severe depletion  Posterior Calf Region  Severe depletion  Edema (RD Assessment)  None  Hair  Reviewed  Eyes  Reviewed  Mouth  Reviewed [poor dentition]  Skin  Reviewed  Nails  Reviewed     Diet Order:   Diet Order    None     EDUCATION NEEDS:   Not appropriate for education at this time  Skin:  Skin Assessment: Reviewed RN Assessment(Stage I ankle)  Last BM:  10/8- type 6  Height:   Ht Readings from Last 1 Encounters:  08/19/19 5\' 7"  (1.702 m)    Weight:   Wt Readings from Last 1 Encounters:  08/19/19 53.7 kg    Ideal Body Weight:  67.2 kg  BMI:  Body mass index is 18.54 kg/m.  Estimated Nutritional Needs:   Kcal:  1496kcal/day  Protein:  80-92g/day  Fluid:  1.6L/day  Koleen Distance MS, RD, LDN Pager #- [phone] Office#- [phone] After Hours Pager: [phone]

## 2019-08-20 NOTE — Progress Notes (Addendum)
Pharmacy Antibiotic Note  Joseph Flores is a 58 y.o. male admitted on 08/19/2019 with pneumonia.  Patient resides at [facility] and has history significant for Down's syndrome, arthritis, and degenerative disc. Patient with elevated PCT. Pharmacy has been consulted for vancomycin and cefepime dosing. Vancomycin 743m IV Q24hr was discontinue on 10/08 due to negative MRSA PCR.   -PCT levels trending down -WBC trending up, on stress dose steroids -BUN and Scr trending down -CXR showed bibasilar opacities that are greater on the right lung, with concerns for pneumonia or atelectasis  Plan: -Continue cefepime 2g IV Q12hr (renally adjusted) -Continue azithromycin 5030mIV Q24hr x 5 doses  Monitor: -Renal dose adjustments as indicated -CAP duration of therapy is at least 5 days -Continue to monitor for signs of improvement and adverse effects   Height: _0  (170.2 cm) Weight: 118 lb 6.2 oz (53.7 kg) IBW/kg (Calculated) : 66.1  Temp (24hrs), Avg:98.6 F (37 C), Min:97.5 F (36.4 C), Max:99.9 F (37.7 C)  Recent Labs  Lab 08/19/19 1106 08/19/19 1345 08/19/19 1445 08/19/19 2019 08/20/19 0125 08/20/19 0338  WBC 8.7  --   --   --   --  17.3*  CREATININE 1.72*  --  1.81*  --   --  1.13  LATICACIDVEN 7.2* 8.7*  --  8.1* 3.9* 3.1*    Estimated Creatinine Clearance: 54.8 mL/min (by C-G formula based on SCr of 1.13 mg/dL).    No Known Allergies  Antimicrobials this admission: Azithromycin 10/08 >> 10/12 Cefepime 10/08 >>  Vancomycin 10/08 >>10/08 Clindamycin 10/08 x 1.   Dose adjustments this admission: N/A  Microbiology results: 10/08 BCx: final cultures pending, no growth <24hr 10/08 UCx: final, no growth 10/08 BAL: final cultures pending, rare gram positive cocci  10/08 MRSA PCR: negative  10/08 COVID: negative   Thank you for allowing pharmacy to be a part of this patient's care.  QuSallye LatPharmD Candidate 08/20/2019 12:52 PM

## 2019-08-20 NOTE — Progress Notes (Addendum)
 Name: Joseph Flores MRN: [id number] DOB: 03/29/1961     CONSULTATION DATE: 08/20/2019  CHIEF COMPLAINT:  Shortness of breath  SYNOPSIS: Joseph Flores is a 58 yo male with a PMH of Down's Syndrome, Degeneration of Intervertebral Disc of Lumbar Region, and Arthritis. He presented to [facility] ER via EMS from [facility] on 10/8 with shortness of breath with fever suspected secondary to aspiration according to facility. EMS reported upon their arrival at [facility] they placed pt on NRB with O2 sats increasing to 95%. The pt was also hypotensive, therefore EMS started initiated levophed gtt. In the ER lab results Na+ 152, CO2 20, glucose 147, BUN 41, creatinine 1.72, anion gap 24, AST 61, ALT 82, lactic acid 7.2, and hgb 12.2. COVID-19 negative, however CXR concerning for pneumonia. Vital signs: temp 102.7 F, hr 138, rr 32, and O2 sats 94% on NRB. He met sepsis protocol therefore he received azithromycin, cefepime, clindamycin, and 2L NS bolus. He was subsequently admitted to the stepdown unit per hospitalist team for additional workup and treatment.In stepdown, pt had significantly increased work of breathing and had to be intubated.  SIGNIFICANT EVENTS/STUDIES: 10/8Presented to ICU with shortness of breath, admitted to ICU with diagnosis of sepsis given elevated lactic acid (7.2), elevated temperature (102), tachypnea, tachycardia, and evidence of bilateral infiltrates on chest X-ray. 10/8 Pt had significantly increased work of breathing requiring intubation. A central line in the left IJ was subsequently placed and a bronchoscopy was performed, obtaining a tracheal aspirate culture and clearing copious amounts of mucous. 10/9 Upon examination, ET tube was found to be displaced from the trachea and had to be replaced using a glidescope  HISTORY OF PRESENT ILLNESS:   Today, ET tube was found to be displaced from the trachea and had to be replaced using a glidescope. Pt remains  intubated and sedated, with bilateral diffuse wheezing and rhonchi.  PAST MEDICAL HISTORY :   has a past medical history of Arthritis, Degeneration of intervertebral disc of lumbar region, and Down's syndrome.  has a past surgical history that includes Leg Surgery (Right); Colonoscopy with propofol (N/A, 09/19/2016); and Knee surgery (Right). Prior to Admission medications   Medication Sig Start Date End Date Taking? Authorizing Provider  citalopram (CELEXA) 10 MG tablet Take 10 mg by mouth daily. 08/19/19   [provider]  predniSONE (DELTASONE) 20 MG tablet Take 2 tablets (40 mg total) by mouth daily. Patient not taking: Reported on 08/19/2019 02/21/18   Paduchowski, Kevin, MD  tamsulosin (FLOMAX) 0.4 MG CAPS capsule Take 0.4 mg by mouth daily. 08/19/19   [provider]  traMADol (ULTRAM) 50 MG tablet Take 1 tablet (50 mg total) by mouth every 12 (twelve) hours as needed. Patient not taking: Reported on 11/18/2017 09/24/16   Shah, Syed Asad A, MD  Vitamin D, Ergocalciferol, (DRISDOL) 50000 units CAPS capsule Take 1 capsule (50,000 Units total) by mouth once a week. For 12 weeks Patient not taking: Reported on 11/18/2017 05/23/17   Shah, Syed Asad A, MD    REVIEW OF SYSTEMS:   Unable to obtain due to critical illness  VITAL SIGNS: Temp:  [98.3 F (36.8 C)-102.7 F (39.3 C)] 98.3 F (36.8 C) (10/09 0400) Pulse Rate:  [57-138] 74 (10/09 0700) Resp:  [13-33] 27 (10/09 0700) BP: (79-153)/(54-89) 85/61 (10/09 0600) SpO2:  [91 %-100 %] 100 % (10/09 0700) FiO2 (%):  [50 %-100 %] 50 % (10/09 0708) Weight:  [53.7 kg-62.2 kg] 53.7 kg (10/08 1600)     I/O last 3 completed shifts: In: 6217.2 [P.O.:60; I.V.:1909.2; NG/GT:200; IV Piggyback:4048.1] Out: 445 [Urine:445] No intake/output data recorded.   SpO2: 100 % O2 Flow Rate (L/min): 10 L/min FiO2 (%): 50 %  Vent Mode: PRVC FiO2 (%):  [50 %-100 %] 50 % Set Rate:  [16 bmp] 16 bmp Vt Set:  [400 mL-450 mL] 450 mL PEEP:  [7  cmH20] 7 cmH20 Plateau Pressure:  [12 cmH20-18 cmH20] 12 cmH20   Physical Examination:  GENERAL:critically ill appearing, intubated and sedated HEAD: Normocephalic, atraumatic.  EYES: Pupils equal, round, reactive to light.  No scleral icterus.  MOUTH: Moist mucosal membrane. NECK: Supple. No JVD.  PULMONARY: +rhonchi, +wheezing bilaterally CARDIOVASCULAR: S1 and S2. Regular rate and rhythm. No murmurs, rubs, or gallops.  GASTROINTESTINAL: Soft, nontender, -distended. No masses. Positive bowel sounds. No hepatosplenomegaly.  MUSCULOSKELETAL: No swelling, clubbing, or edema.  NEUROLOGIC: obtunded, RASS -4 SKIN:intact,warm,dry  I personally reviewed lab work that was obtained in last 24 hrs. CXR Independently reviewed  MEDICATIONS: I have reviewed all medications and confirmed regimen as documented   CULTURE RESULTS   Recent Results (from the past 240 hour(s))  SARS Coronavirus 2 (Hospital order, Performed in  hospital lab) Nasopharyngeal Nasopharyngeal Swab     Status: None   Collection Time: 08/19/19 10:56 AM   Specimen: Nasopharyngeal Swab  Result Value Ref Range Status   SARS Coronavirus 2 NEGATIVE NEGATIVE Final    Comment: (NOTE) If result is NEGATIVE SARS-CoV-2 target nucleic acids are NOT DETECTED. The SARS-CoV-2 RNA is generally detectable in upper and lower  respiratory specimens during the acute phase of infection. The lowest  concentration of SARS-CoV-2 viral copies this assay can detect is 250  copies / mL. A negative result does not preclude SARS-CoV-2 infection  and should not be used as the sole basis for treatment or other  patient management decisions.  A negative result may occur with  improper specimen collection / handling, submission of specimen other  than nasopharyngeal swab, presence of viral mutation(s) within the  areas targeted by this assay, and inadequate number of viral copies  (<250 copies / mL). A negative result must be combined  with clinical  observations, patient history, and epidemiological information. If result is POSITIVE SARS-CoV-2 target nucleic acids are DETECTED. The SARS-CoV-2 RNA is generally detectable in upper and lower  respiratory specimens dur ing the acute phase of infection.  Positive  results are indicative of active infection with SARS-CoV-2.  Clinical  correlation with patient history and other diagnostic information is  necessary to determine patient infection status.  Positive results do  not rule out bacterial infection or co-infection with other viruses. If result is PRESUMPTIVE POSTIVE SARS-CoV-2 nucleic acids MAY BE PRESENT.   A presumptive positive result was obtained on the submitted specimen  and confirmed on repeat testing.  While 2019 novel coronavirus  (SARS-CoV-2) nucleic acids may be present in the submitted sample  additional confirmatory testing may be necessary for epidemiological  and / or clinical management purposes  to differentiate between  SARS-CoV-2 and other Sarbecovirus currently known to infect humans.  If clinically indicated additional testing with an alternate test  methodology ([id number]) is advised. The SARS-CoV-2 RNA is generally  detectable in upper and lower respiratory sp ecimens during the acute  phase of infection. The expected result is Negative. Fact Sheet for Patients:  https://www.fda.gov/media/136312/download Fact Sheet for Healthcare Providers: https://www.fda.gov/media/136313/download This test is not yet approved or cleared by the [place] FDA and has been   authorized for detection and/or diagnosis of SARS-CoV-2 by FDA under an Emergency Use Authorization (EUA).  This EUA will remain in effect (meaning this test can be used) for the duration of the COVID-19 declaration under Section 564(b)(1) of the Act, 21 U.S.C. section 360bbb-3(b)(1), unless the authorization is terminated or revoked sooner. Performed at [facility], [address] [place], [place] [address]   Blood Culture (routine x 2)     Status: None (Preliminary result)   Collection Time: 08/19/19 11:05 AM   Specimen: BLOOD  Result Value Ref Range Status   Specimen Description BLOOD BLOOD RIGHT HAND  Final   Special Requests   Final    BOTTLES DRAWN AEROBIC AND ANAEROBIC Blood Culture results may not be optimal due to an inadequate volume of blood received in culture bottles   Culture   Final    NO GROWTH < 24 HOURS Performed at [facility], [address] [place], [place] [address]    Report Status PENDING  Incomplete  Blood Culture (routine x 2)     Status: None (Preliminary result)   Collection Time: 08/19/19 11:14 AM   Specimen: BLOOD  Result Value Ref Range Status   Specimen Description BLOOD BLOOD LEFT HAND  Final   Special Requests   Final    BOTTLES DRAWN AEROBIC AND ANAEROBIC Blood Culture adequate volume   Culture   Final    NO GROWTH < 24 HOURS Performed at [facility], [address] [place], [place] [address]    Report Status PENDING  Incomplete  MRSA PCR Screening     Status: None   Collection Time: 08/19/19  3:02 PM   Specimen: Nasopharyngeal  Result Value Ref Range Status   MRSA by PCR NEGATIVE NEGATIVE Final    Comment:        The GeneXpert MRSA Assay (FDA approved for NASAL specimens only), is one component of a comprehensive MRSA colonization surveillance program. It is not intended to diagnose MRSA infection nor to guide or monitor treatment for MRSA infections. Performed at [facility], [address] ,  [address]           IMAGING    Dg Chest Port 1 View  Result Date: 08/20/2019 CLINICAL DATA:  Acute respiratory failure EXAM: PORTABLE CHEST 1 VIEW COMPARISON:  None. FINDINGS: Endotracheal tube has been withdrawn such that the tip is 9.8 cm from the carina. Tip is at the thoracic inlet. Consider advancement by approximately 5 cm. Central venous line  unchanged. RIGHT lower lobe airspace disease noted. No pneumothorax IMPRESSION: 1. Endotracheal tube has with been withdrawn to thoracic inlet approximately 10 cm from carina. Consider advancement by 4-5 cm. 2. RIGHT lower lobe pneumonia Electronically Signed   By: Stewart  Edmunds M.D.   On: 08/20/2019 08:21   Dg Chest Port 1 View  Result Date: 08/19/2019 CLINICAL DATA:  Intubated. Orogastric tube place. Central venous line placed. EXAM: PORTABLE CHEST 1 VIEW COMPARISON:  Earlier today. FINDINGS: Interval endotracheal tube with its tip in the right mainstem bronchus. Interval nasogastric tube with its tip in the distal stomach and side hole in the proximal stomach. Interval left jugular catheter with its tip in the superior vena cava. No pneumothorax. Stable normal sized heart and bilateral patchy airspace opacity. No pleural fluid. Inferior vena cava filter. Mild thoracic spine degenerative changes. IMPRESSION: 1. Endotracheal tube tip in the right mainstem bronchus. It is recommended that this be retracted 8 cm period 2. Interval   left jugular catheter with its tip in the superior vena cava. 3. No pneumothorax. 4. Stable bilateral pneumonia. These results will be called to the ordering clinician or representative by the Radiologist Assistant, and communication documented in the PACS or zVision Dashboard. Electronically Signed   By: Steven  Reid M.D.   On: 08/19/2019 19:47   Dg Chest Port 1 View  Result Date: 08/19/2019 CLINICAL DATA:  58-year-old male with fever and shortness of breath. Requiring oxygen. Hypotensive. Test for COVID-19 pending. EXAM: PORTABLE CHEST 1 VIEW COMPARISON:  Portable chest. 05/20/2018 and earlier FINDINGS: Portable AP upright view at 1110 hours. Confluent bilateral pulmonary opacity, maximal at the right lower lobe. Larger lung volumes compared to 2019. Mediastinal contours remain normal. Visualized tracheal air column is within normal limits. No superimposed pneumothorax,  pulmonary edema or pleural effusion. No acute osseous abnormality identified. Paucity of bowel gas in the upper abdomen. IMPRESSION: Confluent bilateral pulmonary opacities. Top differential considerations are pneumonia and aspiration. No pleural effusion. Electronically Signed   By: H  Hall M.D.   On: 08/19/2019 11:41    Central Line/ continued, requirement due to  Reason to continue Centra Line Monitoring of central venous pressure or other hemodynamic parameters and poor IV access   Ventilator continued, requirement due to severe respiratory failure   Ventilator Sedation RASS 0 to -2      ASSESSMENT AND PLAN SYNOPSIS 58 yo male with Down Syndrome admitted to the ICU for acute hypoxic respiratory failure secondary to bilateral pneumonia and septic shock.  Severe ACUTE Hypoxic and Hypercapnic Respiratory Failure -continue Full MV support -continue Bronchodilator Therapy -Wean Fio2 and PEEP as tolerated -will perform SAT/SBT when respiratory parameters are met   SHOCK-SEPSIS -use vasopressors to keep MAP>65 -follow ABG and LA -follow up cultures -emperic ABX  -start stress dose steroids -aggressive IV fluid resuscitation  NEUROLOGY - intubated and sedated - minimal sedation to achieve a RASS goal: -1 Wake up assessment pending  CARDIAC ICU monitoring  ID - continue IV abx as prescibed - follow up cultures - HIV negative  GI GI PROPHYLAXIS as indicated  NUTRITIONAL STATUS DIET-->TF's as tolerated Constipation protocol as indicated  ENDO - will use ICU hypoglycemic\Hyperglycemia protocol if needed  ELECTROLYTES -follow labs as needed -replace as needed -pharmacy consultation and following  DVT/GI PRX ordered TRANSFUSIONS AS NEEDED MONITOR FSBS ASSESS the need for LABS    Critical Care Time devoted to patient care services described in this note is 40 minutes.   Overall, patient is critically ill, prognosis is guarded.  Patient with Multiorgan failure  and at high risk for cardiac arrest and death.    Family was updated    David , M.D.  [facility]  Medical Director ICU-[facility] [place] Medical Director [facility] Cardio-Pulmonary Department        

## 2019-08-20 NOTE — Consult Note (Signed)
                                                                                 Consultation Note Date: 08/20/2019   Patient Name: Joseph Flores  DOB: 09/16/1961  MRN: [id number]  Age / Sex: 58 y.o., male  PCP: Patel, Bhavesh, MD Referring Physician: Vachhani, Vaibhavkumar, *  Reason for Consultation: Establishing goals of care and Psychosocial/spiritual support  HPI/Patient Profile: 58 y.o. male  with past medical history of Down syndrome, arthritis, DDD lumbar, contracture admitted on 08/19/2019 with acute respiratory failure with hypoxia d/t sepsis and aspiration PNE.   Clinical Assessment and Goals of Care: [name] is lying quietly in bed, intubated/ventilated, sedated.  He appears comfortable. There is no family at bedside at this time.   Conference with bedside nursing staff related to patient condition, needs.    Return later in the morning, again family not at bedside.   Call to sister Joseph Flores at [phone], no answer, no message left dt spanish greeting. Call to [phone], no answer.   PMT to continue to follow.    HCPOA    LEGAL GUARDIAN - older sister, Joseph Flores    SUMMARY OF RECOMMENDATIONS   24-48 hours for outcomes.  Anticipate return to ALF/SNF.   Code Status/Advance Care Planning:  Full code   Symptom Management:   Per CCM, no additional needs at this time.   Palliative Prophylaxis:   Oral Care and Turn Reposition  Additional Recommendations (Limitations, Scope, Preferences):  Full Scope Treatment  Psycho-social/Spiritual:   Desire for further Chaplaincy support:yes  Additional Recommendations: Caregiving  Support/Resources and Education on Hospice  Prognosis:   Unable to determine, based on outcomes.   Discharge Planning: To be determined, based on outcomes.       Primary Diagnoses: Present on Admission: . Acute respiratory failure with hypoxia (HCC)   I have reviewed the medical record, interviewed the  patient and family, and examined the patient. The following aspects are pertinent.  Past Medical History:  Diagnosis Date  . Arthritis   . Degeneration of intervertebral disc of lumbar region   . Down's syndrome    Social History   Socioeconomic History  . Marital status: Single    Spouse name: Not on file  . Number of children: 0  . Years of education: 12  . Highest education level: Not on file  Occupational History  . Occupation: Disabled  Social Needs  . Financial resource strain: Not hard at all  . Food insecurity    Worry: Never true    Inability: Never true  . Transportation needs    Medical: Yes    Non-medical: Yes  Tobacco Use  . Smoking status: Never Smoker  . Smokeless tobacco: Never Used  Substance and Sexual Activity  . Alcohol use: No  . Drug use: No  . Sexual activity: Never  Lifestyle  . Physical activity    Days per week: 0 days    Minutes per session: 0 min  . Stress: Not at all  Relationships  . Social connections    Talks on phone: Patient refused    Gets together: Patient   refused    Attends religious service: Patient refused    Active member of club or organization: Patient refused    Attends meetings of clubs or organizations: Patient refused    Relationship status: Patient refused  Other Topics Concern  . Not on file  Social History Narrative   Sister transports to all appointments. Lives with sister who provided all care to pt   Family History  Problem Relation Age of Onset  . Kidney disease Mother        Had part of rib and kidney removed  . Cancer Mother   . Hypertension Mother   . Hypertension Father   . Healthy Sister   . Cancer Brother   . Healthy Sister   . Healthy Sister   . Rheum arthritis Sister    Scheduled Meds: . chlorhexidine gluconate (MEDLINE KIT)  15 mL Mouth Rinse BID  . Chlorhexidine Gluconate Cloth  6 each Topical Daily  . citalopram  10 mg Per Tube Daily  . docusate  100 mg Per Tube Daily  . fentaNYL  (SUBLIMAZE) injection  50 mcg Intravenous Once  . free water  100 mL Per Tube Q4H  . heparin  5,000 Units Subcutaneous Q8H  . hydrocortisone sod succinate (SOLU-CORTEF) inj  50 mg Intravenous Q6H  . mouth rinse  15 mL Mouth Rinse 10 times per day  . sennosides  5 mL Per Tube BID   Continuous Infusions: . sodium chloride 10 mL/hr at 08/20/19 0600  . azithromycin 500 mg (08/20/19 0957)  . ceFEPime (MAXIPIME) IV 2 g (08/20/19 0847)  . dextrose 5% lactated ringers 75 mL/hr at 08/20/19 0600  . famotidine (PEPCID) IV Stopped (08/19/19 2230)  . fentaNYL infusion INTRAVENOUS 50 mcg/hr (08/20/19 0600)  . norepinephrine (LEVOPHED) Adult infusion Stopped (08/20/19 0502)  . phenylephrine (NEO-SYNEPHRINE) Adult infusion    . vasopressin (PITRESSIN) infusion - *FOR SHOCK*     PRN Meds:.acetaminophen **OR** acetaminophen, albuterol, bisacodyl, docusate, fentaNYL, midazolam, midazolam, ondansetron **OR** ondansetron (ZOFRAN) IV Medications Prior to Admission:  Prior to Admission medications   Medication Sig Start Date End Date Taking? Authorizing Provider  citalopram (CELEXA) 10 MG tablet Take 10 mg by mouth daily. 08/19/19   [provider]  predniSONE (DELTASONE) 20 MG tablet Take 2 tablets (40 mg total) by mouth daily. Patient not taking: Reported on 08/19/2019 02/21/18   Paduchowski, Kevin, MD  tamsulosin (FLOMAX) 0.4 MG CAPS capsule Take 0.4 mg by mouth daily. 08/19/19   [provider]  traMADol (ULTRAM) 50 MG tablet Take 1 tablet (50 mg total) by mouth every 12 (twelve) hours as needed. Patient not taking: Reported on 11/18/2017 09/24/16   Shah, Syed Asad A, MD  Vitamin D, Ergocalciferol, (DRISDOL) 50000 units CAPS capsule Take 1 capsule (50,000 Units total) by mouth once a week. For 12 weeks Patient not taking: Reported on 11/18/2017 05/23/17   Shah, Syed Asad A, MD   No Known Allergies Review of Systems  Unable to perform ROS: Intubated    Physical Exam Vitals signs and nursing  note reviewed.     Vital Signs: BP 92/67   Pulse 73   Temp (!) 97.5 F (36.4 C) (Axillary)   Resp 16   Ht 5' 7" (1.702 m)   Wt 53.7 kg   SpO2 100%   BMI 18.54 kg/m  Pain Scale: CPOT       SpO2: SpO2: 100 % O2 Device:SpO2: 100 % O2 Flow Rate: .O2 Flow Rate (L/min): 10 L/min  IO: Intake/output   summary:   Intake/Output Summary (Last 24 hours) at 08/20/2019 1126 Last data filed at 08/20/2019 1000 Gross per 24 hour  Intake 6217.22 ml  Output 495 ml  Net 5722.22 ml    LBM: Last BM Date: 08/19/19 Baseline Weight: Weight: 62.2 kg Most recent weight: Weight: 53.7 kg     Palliative Assessment/Data:   Flowsheet Rows     Most Recent Value  Intake Tab  Referral Department  Hospitalist  Unit at Time of Referral  ICU  Palliative Care Primary Diagnosis  Pulmonary  Date Notified  08/19/19  Palliative Care Type  New Palliative care  Reason for referral  Clarify Goals of Care  Date of Admission  08/19/19  Date first seen by Palliative Care  08/20/19  # of days Palliative referral response time  1 Day(s)  # of days IP prior to Palliative referral  0  Clinical Assessment  Palliative Performance Scale Score  20%  Pain Max last 24 hours  Not able to report  Pain Min Last 24 hours  Not able to report  Dyspnea Max Last 24 Hours  Not able to report  Dyspnea Min Last 24 hours  Not able to report  Psychosocial & Spiritual Assessment  Palliative Care Outcomes      Time In: 1110 Time Out: 1140 Time Total: 30 minutes  Greater than 50%  of this time was spent counseling and coordinating care related to the above assessment and plan.  Signed by: Tasha A Dove, NP   Please contact Palliative Medicine Team phone at [phone] for questions and concerns.  For individual provider: See Amion 

## 2019-08-21 ENCOUNTER — Inpatient Hospital Stay: Payer: Medicare Other

## 2019-08-21 LAB — CBC WITH DIFFERENTIAL/PLATELET
Abs Immature Granulocytes: 0.19 10*3/uL — ABNORMAL HIGH (ref 0.00–0.07)
Basophils Absolute: 0 10*3/uL (ref 0.0–0.1)
Basophils Relative: 0 %
Eosinophils Absolute: 0 10*3/uL (ref 0.0–0.5)
Eosinophils Relative: 0 %
HCT: 25.9 % — ABNORMAL LOW (ref 39.0–52.0)
Hemoglobin: 8 g/dL — ABNORMAL LOW (ref 13.0–17.0)
Immature Granulocytes: 2 %
Lymphocytes Relative: 5 %
Lymphs Abs: 0.7 10*3/uL (ref 0.7–4.0)
MCH: 29.6 pg (ref 26.0–34.0)
MCHC: 30.9 g/dL (ref 30.0–36.0)
MCV: 95.9 fL (ref 80.0–100.0)
Monocytes Absolute: 0.6 10*3/uL (ref 0.1–1.0)
Monocytes Relative: 4 %
Neutro Abs: 11.5 10*3/uL — ABNORMAL HIGH (ref 1.7–7.7)
Neutrophils Relative %: 89 %
Platelets: 175 10*3/uL (ref 150–400)
RBC: 2.7 MIL/uL — ABNORMAL LOW (ref 4.22–5.81)
RDW: 16.8 % — ABNORMAL HIGH (ref 11.5–15.5)
Smear Review: NORMAL
WBC: 12.9 10*3/uL — ABNORMAL HIGH (ref 4.0–10.5)
nRBC: 0 % (ref 0.0–0.2)

## 2019-08-21 LAB — BASIC METABOLIC PANEL
Anion gap: 4 — ABNORMAL LOW (ref 5–15)
BUN: 26 mg/dL — ABNORMAL HIGH (ref 6–20)
CO2: 30 mmol/L (ref 22–32)
Calcium: 8.1 mg/dL — ABNORMAL LOW (ref 8.9–10.3)
Chloride: 111 mmol/L (ref 98–111)
Creatinine, Ser: 0.83 mg/dL (ref 0.61–1.24)
GFR calc Af Amer: >60 mL/min (ref >60)
GFR calc non Af Amer: >60 mL/min (ref >60)
Glucose, Bld: 171 mg/dL — ABNORMAL HIGH (ref 70–99)
Potassium: 3.6 mmol/L (ref 3.5–5.1)
Sodium: 145 mmol/L (ref 135–145)

## 2019-08-21 LAB — CULTURE, BAL-QUANTITATIVE W GRAM STAIN: Culture: >=100000 — AB

## 2019-08-21 LAB — BLOOD GAS, ARTERIAL
Acid-Base Excess: 6.6 mmol/L — ABNORMAL HIGH (ref 0.0–2.0)
Bicarbonate: 31.3 mmol/L — ABNORMAL HIGH (ref 20.0–28.0)
FIO2: 0.3
MECHVT: 450 mL
Mechanical Rate: 16
O2 Saturation: 97.9 %
Patient temperature: 37
pCO2 arterial: 44 mmHg (ref 32.0–48.0)
pH, Arterial: 7.46 — ABNORMAL HIGH (ref 7.350–7.450)
pO2, Arterial: 97 mmHg (ref 83.0–108.0)

## 2019-08-21 LAB — GLUCOSE, CAPILLARY
Glucose-Capillary: 121 mg/dL — ABNORMAL HIGH (ref 70–99)
Glucose-Capillary: 134 mg/dL — ABNORMAL HIGH (ref 70–99)
Glucose-Capillary: 134 mg/dL — ABNORMAL HIGH (ref 70–99)
Glucose-Capillary: 145 mg/dL — ABNORMAL HIGH (ref 70–99)
Glucose-Capillary: 153 mg/dL — ABNORMAL HIGH (ref 70–99)
Glucose-Capillary: 173 mg/dL — ABNORMAL HIGH (ref 70–99)

## 2019-08-21 LAB — MAGNESIUM: Magnesium: 2.1 mg/dL (ref 1.7–2.4)

## 2019-08-21 LAB — PHOSPHORUS: Phosphorus: 1.5 mg/dL — ABNORMAL LOW (ref 2.5–4.6)

## 2019-08-21 LAB — PROCALCITONIN: Procalcitonin: 26.65 ng/mL

## 2019-08-21 MED ORDER — POTASSIUM PHOSPHATES 15 MMOLE/5ML IV SOLN: 30.0000 mmol | Freq: Once | INTRAVENOUS | Status: DC

## 2019-08-21 MED ORDER — POTASSIUM PHOSPHATES 15 MMOLE/5ML IV SOLN
20.0000 meq | Freq: Once | INTRAVENOUS | Status: AC
  Administered 2019-08-21: 20 meq via INTRAVENOUS
  Filled 2019-08-21: qty 4.55

## 2019-08-21 MED ORDER — SODIUM CHLORIDE 0.9 % IV SOLN
2.0000 g | Freq: Three times a day (TID) | INTRAVENOUS | Status: DC
  Administered 2019-08-21 – 2019-08-24 (×10): 2 g via INTRAVENOUS
  Filled 2019-08-21 (×15): qty 2

## 2019-08-21 NOTE — Progress Notes (Signed)
Pharmacy Electrolyte Monitoring Consult:  Pharmacy consulted to assist in monitoring and replacing electrolytes in this 58 y.o. male admitted on 08/19/2019 with sepsis. Patient subsequently intubated upon admission to ICU. Patient's sedation regimen includes continuous fentanyl infusion. Patient with past medical history significant for Down's syndrome, arthritis, and degenerative disc. Patient taking prednisone 40mg  daily as an outpatient.   Labs:  Sodium (mmol/L)  Date Value  08/21/2019 145  12/06/2015 138  08/26/2014 142   Potassium (mmol/L)  Date Value  08/21/2019 3.6  08/26/2014 3.4 (L)   Magnesium (mg/dL)  Date Value  08/21/2019 2.1   Phosphorus (mg/dL)  Date Value  08/21/2019 1.5 (L)   Calcium (mg/dL)  Date Value  08/21/2019 8.1 (L)   Calcium, Total (mg/dL)  Date Value  08/26/2014 8.0 (L)   Albumin (g/dL)  Date Value  08/20/2019 2.6 (L)  12/06/2015 3.4 (L)  02/04/2012 3.2 (L)  Corrected calcium: 9 mg/dl  Assessment/Plan: 1. Electrolytes: Will replace w/ Kphos 20 mEq IV x 1 over 6 hours and will recheck w/ electrolytes w/ am labs.  2. Glucose: Patient with no history of diabetes. Last A1c from 06/2017 was 6.0 (pre-diabetes). Patient is on stress dose steroids. Insulin therapy with SSI Q4hr. Glucose trending up -Continue to monitor glucose trend -Consider adding basal insulin if glucose continues to trend up -Continue to assess need for steroids  3. Constipation: LBM 10/8. Patient is starting tube feeds today. Constipating medication includes Fentanyl  -Continue senna/docusate VT BID.  -Monitor for loose stools while on ABX.    Pharmacy will continue to monitor and adjust per consult.   Tobie Lords, PharmD, BCPS Clinical Pharmacist 08/21/2019 7:21 AM

## 2019-08-21 NOTE — Progress Notes (Signed)
Pt. Extubated to room air,sat 95, No distress noted at this time.

## 2019-08-21 NOTE — Progress Notes (Signed)
[facility] [place] NAME: Joseph Flores    MR#:  [id number]  DATE OF BIRTH:  01-20-1961  SUBJECTIVE:  CHIEF COMPLAINT:   Chief Complaint  Patient presents with  . Shortness of Breath   Sent from nursing facility for suspected aspiration episode and hypoxia.  Intubated by ICU team on arrival. Extubated today.  REVIEW OF SYSTEMS:  Due to baseline Down syndrome patient cannot give reliable review of system.  Does not appear in any discomfort.  ROS  DRUG ALLERGIES:  No Known Allergies  VITALS:  Blood pressure 94/71, pulse 76, temperature 98.2 F (36.8 C), temperature source Oral, resp. rate (!) 23, height 5\' 7"  (1.702 m), weight 53.7 kg, SpO2 96 %.  PHYSICAL EXAMINATION:  GENERAL:  58 y.o.-year-old patient lying in the bed , resting comfortably.Marland Kitchen  EYES: Pupils equal, round, reactive to light . No scleral icterus. Extraocular muscles intact.  HEENT: Head atraumatic, normocephalic. Oropharynx and nasopharynx clear.  NECK:  Supple, no jugular venous distention. No thyroid enlargement, no tenderness.  LUNGS: Normal breath sounds bilaterally, no wheezing, rales,rhonchi or crepitation. No use of accessory muscles of respiration.   CARDIOVASCULAR: S1, S2 normal. No murmurs, rubs, or gallops.  ABDOMEN: Soft, nontender, nondistended. Bowel sounds present. No organomegaly or mass.  EXTREMITIES: No pedal edema, cyanosis, or clubbing.  NEUROLOGIC: He is looking at me but noncommunicative due to his baseline Down syndrome.  He does not follow commands and he moves his limbs a little spontaneously. PSYCHIATRIC: The patient is alert and oriented x 0.  SKIN: No obvious rash, lesion, or ulcer.   Physical Exam LABORATORY PANEL:   CBC Recent Labs  Lab 08/21/19 0354  WBC 12.9*  HGB 8.0*  HCT 25.9*  PLT 175   ------------------------------------------------------------------------------------------------------------------  Chemistries  Recent  Labs  Lab 08/20/19 0338 08/21/19 0354  NA 149* 145  K 4.5 3.6  CL 110 111  CO2 27 30  GLUCOSE 175* 171*  BUN 30* 26*  CREATININE 1.13 0.83  CALCIUM 7.9* 8.1*  MG 1.9 2.1  AST 38  --   ALT 62*  --   ALKPHOS 40  --   BILITOT 0.7  --    ------------------------------------------------------------------------------------------------------------------  Cardiac Enzymes No results for input(s): TROPONINI in the last 168 hours. ------------------------------------------------------------------------------------------------------------------  RADIOLOGY:  Dg Chest Port 1 View  Result Date: 08/21/2019 CLINICAL DATA:  Acute respiratory failure. EXAM: PORTABLE CHEST 1 VIEW COMPARISON:  August 20, 2019. FINDINGS: The heart size and mediastinal contours are within normal limits. Endotracheal and nasogastric tubes are in grossly good position. Left internal jugular catheter is unchanged in position. No pneumothorax or pleural effusion is noted. Stable bibasilar opacities are noted concerning for pneumonia. The visualized skeletal structures are unremarkable. IMPRESSION: Endotracheal and nasogastric tubes are in grossly good position. Stable bibasilar opacities as described above. Electronically Signed   By: Marijo Conception M.D.   On: 08/21/2019 09:09   Dg Chest Port 1 View  Result Date: 08/20/2019 CLINICAL DATA:  Respiratory failure. EXAM: PORTABLE CHEST 1 VIEW COMPARISON:  Same day. FINDINGS: The heart size and mediastinal contours are within normal limits. Nasogastric tube is seen entering stomach. Endotracheal tube is seen entering right mainstem bronchus; withdrawal by 3-4 cm is recommended. Left internal jugular catheter is unchanged in position. No pneumothorax is noted. No pleural effusion is noted. Bibasilar opacities are noted, right greater than left. The visualized skeletal structures are unremarkable. IMPRESSION: Endotracheal tube is directed into right mainstem bronchus;  withdrawal by  3-4 cm is recommended. Critical Value/emergent results were called by telephone at the time of interpretation on 08/20/2019 at 8:33 am to [facility], who verbally acknowledged these results. Otherwise stable support apparatus. Bibasilar opacities are noted, right greater than left, concerning for pneumonia or atelectasis. Electronically Signed   By: Marijo Conception M.D.   On: 08/20/2019 08:34   Dg Chest Port 1 View  Result Date: 08/20/2019 CLINICAL DATA:  Acute respiratory failure EXAM: PORTABLE CHEST 1 VIEW COMPARISON:  None. FINDINGS: Endotracheal tube has been withdrawn such that the tip is 9.8 cm from the carina. Tip is at the thoracic inlet. Consider advancement by approximately 5 cm. Central venous line unchanged. RIGHT lower lobe airspace disease noted. No pneumothorax IMPRESSION: 1. Endotracheal tube has with been withdrawn to thoracic inlet approximately 10 cm from carina. Consider advancement by 4-5 cm. 2. RIGHT lower lobe pneumonia Electronically Signed   By: Suzy Bouchard M.D.   On: 08/20/2019 08:21    ASSESSMENT AND PLAN:   Active Problems:   Acute respiratory failure (HCC)   Sepsis ([place])   Community acquired pneumonia   Pressure injury of skin   Goals of care, counseling/discussion   Palliative care by specialist   * Acute respiratory failure with hypoxia due to sepsis and aspiration pneumonia. Status post intubation, continue ventilatory support and manage per ICU team. Broad-spectrum antibiotics. Change to azithromycin and cefepime per ICU. Extubated per ICU- 08/21/19  * Hypotension due to sepsis.  Continue IV fluid support, normal saline bolus and Levophed . Improved and stable now.  * Lactic acidosis due to sepsis.  Treatment as above and follow-up lactic acid level.  * Hypernatremia.  Half-normal saline IV and follow-up BMP.  * Acute renal failure on CKD, IV fluid support and follow-up BMP. Improved.  * Abnormal liver function tests due to sepsis,  follow-up CMP.  All the records are reviewed and case discussed with Care Management/Social Workerr. Management plans discussed with the patient, family and they are in agreement.  CODE STATUS: Full code  TOTAL TIME TAKING CARE OF THIS PATIENT: 35 minutes.     POSSIBLE D/C IN 2-3 DAYS, DEPENDING ON CLINICAL CONDITION.   Vaughan Basta M.D on 08/21/2019   Between 7am to 6pm - Pager - [phone]  After 6pm go to [email or web address] - password [email or web address] [facility]  Office  [phone]  CC: Primary care physician; Dianne Dun, MD  Note: This dictation was prepared with Dragon dictation along with smaller phrase technology. Any transcriptional errors that result from this process are unintentional.

## 2019-08-21 NOTE — Progress Notes (Signed)
Patient extubated to room air- had tolerated spontaneous mode- 5/5 28% and sating 98-100%- RR 16- no distress for approximately 1 hour. Vitals stable.

## 2019-08-21 NOTE — Progress Notes (Signed)
[facility] [place] NAME: Joseph Flores    MR#:  [id number]  DATE OF BIRTH:  May 18, 1961  SUBJECTIVE:  CHIEF COMPLAINT:   Chief Complaint  Patient presents with  . Shortness of Breath   Sent from nursing facility for suspected aspiration episode and hypoxia.  Intubated by ICU team on arrival.  REVIEW OF SYSTEMS:  Patient is sedated and intubated so cannot give review of system.  ROS  DRUG ALLERGIES:  No Known Allergies  VITALS:  Blood pressure 94/71, pulse 76, temperature 98.2 F (36.8 C), temperature source Oral, resp. rate (!) 23, height 5\' 7"  (1.702 m), weight 53.7 kg, SpO2 96 %.  PHYSICAL EXAMINATION:  GENERAL:  58 y.o.-year-old patient lying in the bed , critical appearance.Marland Kitchen  EYES: Pupils equal, round, reactive to light . No scleral icterus. Extraocular muscles intact.  HEENT: Head atraumatic, normocephalic. Oropharynx and nasopharynx clear.  NECK:  Supple, no jugular venous distention. No thyroid enlargement, no tenderness.  LUNGS: Normal breath sounds bilaterally, no wheezing, rales,rhonchi or crepitation. No use of accessory muscles of respiration.  On ventilatory support. CARDIOVASCULAR: S1, S2 normal. No murmurs, rubs, or gallops.  ABDOMEN: Soft, nontender, nondistended. Bowel sounds present. No organomegaly or mass.  EXTREMITIES: No pedal edema, cyanosis, or clubbing.  NEUROLOGIC: Sedated on ventilatory support. PSYCHIATRIC: The patient is alert and oriented x 3.  SKIN: No obvious rash, lesion, or ulcer.   Physical Exam LABORATORY PANEL:   CBC Recent Labs  Lab 08/21/19 0354  WBC 12.9*  HGB 8.0*  HCT 25.9*  PLT 175   ------------------------------------------------------------------------------------------------------------------  Chemistries  Recent Labs  Lab 08/20/19 0338 08/21/19 0354  NA 149* 145  K 4.5 3.6  CL 110 111  CO2 27 30  GLUCOSE 175* 171*  BUN 30* 26*  CREATININE 1.13 0.83  CALCIUM  7.9* 8.1*  MG 1.9 2.1  AST 38  --   ALT 62*  --   ALKPHOS 40  --   BILITOT 0.7  --    ------------------------------------------------------------------------------------------------------------------  Cardiac Enzymes No results for input(s): TROPONINI in the last 168 hours. ------------------------------------------------------------------------------------------------------------------  RADIOLOGY:  Dg Chest Port 1 View  Result Date: 08/21/2019 CLINICAL DATA:  Acute respiratory failure. EXAM: PORTABLE CHEST 1 VIEW COMPARISON:  August 20, 2019. FINDINGS: The heart size and mediastinal contours are within normal limits. Endotracheal and nasogastric tubes are in grossly good position. Left internal jugular catheter is unchanged in position. No pneumothorax or pleural effusion is noted. Stable bibasilar opacities are noted concerning for pneumonia. The visualized skeletal structures are unremarkable. IMPRESSION: Endotracheal and nasogastric tubes are in grossly good position. Stable bibasilar opacities as described above. Electronically Signed   By: Marijo Conception M.D.   On: 08/21/2019 09:09   Dg Chest Port 1 View  Result Date: 08/20/2019 CLINICAL DATA:  Respiratory failure. EXAM: PORTABLE CHEST 1 VIEW COMPARISON:  Same day. FINDINGS: The heart size and mediastinal contours are within normal limits. Nasogastric tube is seen entering stomach. Endotracheal tube is seen entering right mainstem bronchus; withdrawal by 3-4 cm is recommended. Left internal jugular catheter is unchanged in position. No pneumothorax is noted. No pleural effusion is noted. Bibasilar opacities are noted, right greater than left. The visualized skeletal structures are unremarkable. IMPRESSION: Endotracheal tube is directed into right mainstem bronchus; withdrawal by 3-4 cm is recommended. Critical Value/emergent results were called by telephone at the time of interpretation on 08/20/2019 at 8:33 am to [place],  who verbally acknowledged these  results. Otherwise stable support apparatus. Bibasilar opacities are noted, right greater than left, concerning for pneumonia or atelectasis. Electronically Signed   By: Marijo Conception M.D.   On: 08/20/2019 08:34   Dg Chest Port 1 View  Result Date: 08/20/2019 CLINICAL DATA:  Acute respiratory failure EXAM: PORTABLE CHEST 1 VIEW COMPARISON:  None. FINDINGS: Endotracheal tube has been withdrawn such that the tip is 9.8 cm from the carina. Tip is at the thoracic inlet. Consider advancement by approximately 5 cm. Central venous line unchanged. RIGHT lower lobe airspace disease noted. No pneumothorax IMPRESSION: 1. Endotracheal tube has with been withdrawn to thoracic inlet approximately 10 cm from carina. Consider advancement by 4-5 cm. 2. RIGHT lower lobe pneumonia Electronically Signed   By: Suzy Bouchard M.D.   On: 08/20/2019 08:21   Dg Chest Port 1 View  Result Date: 08/19/2019 CLINICAL DATA:  Intubated. Orogastric tube place. Central venous line placed. EXAM: PORTABLE CHEST 1 VIEW COMPARISON:  Earlier today. FINDINGS: Interval endotracheal tube with its tip in the right mainstem bronchus. Interval nasogastric tube with its tip in the distal stomach and side hole in the proximal stomach. Interval left jugular catheter with its tip in the superior vena cava. No pneumothorax. Stable normal sized heart and bilateral patchy airspace opacity. No pleural fluid. Inferior vena cava filter. Mild thoracic spine degenerative changes. IMPRESSION: 1. Endotracheal tube tip in the right mainstem bronchus. It is recommended that this be retracted 8 cm period 2. Interval left jugular catheter with its tip in the superior vena cava. 3. No pneumothorax. 4. Stable bilateral pneumonia. These results will be called to the ordering clinician or representative by the Radiologist Assistant, and communication documented in the PACS or zVision Dashboard. Electronically Signed   By: Claudie Revering  M.D.   On: 08/19/2019 19:47    ASSESSMENT AND PLAN:   Active Problems:   Acute respiratory failure (HCC)   Sepsis ([place])   Community acquired pneumonia   Pressure injury of skin   Goals of care, counseling/discussion   Palliative care by specialist   * Acute respiratory failure with hypoxia due to sepsis and aspiration pneumonia. Status post intubation, continue ventilatory support and manage per ICU team. Broad-spectrum antibiotics.  * Hypotension due to sepsis.  Continue IV fluid support, normal saline bolus and Levophed .  * Lactic acidosis due to sepsis.  Treatment as above and follow-up lactic acid level.  * Hypernatremia.  Half-normal saline IV and follow-up BMP.  * Acute renal failure on CKD, IV fluid support and follow-up BMP.  * Abnormal liver function tests due to sepsis, follow-up CMP.  All the records are reviewed and case discussed with Care Management/Social Workerr. Management plans discussed with the patient, family and they are in agreement.  CODE STATUS: Full code  TOTAL TIME TAKING CARE OF THIS PATIENT: 35 minutes.     POSSIBLE D/C IN 2-3 DAYS, DEPENDING ON CLINICAL CONDITION.   Vaughan Basta M.D on 08/21/2019   Between 7am to 6pm - Pager - [phone]  After 6pm go to [email or web address] - password [email or web address] [facility]  Office  [phone]  CC: Primary care physician; Dianne Dun, MD  Note: This dictation was prepared with Dragon dictation along with smaller phrase technology. Any transcriptional errors that result from this process are unintentional.

## 2019-08-21 NOTE — Progress Notes (Signed)
Name: Joseph Flores MRN: [id number] DOB: Aug 22, 1961     CONSULTATION DATE: 08/20/2019  CHIEF COMPLAINT:  Shortness of breath  SYNOPSIS: Joseph Flores is a 58 yo male with a PMH of Down's Syndrome, Degeneration of Intervertebral Disc of Lumbar Region, and Arthritis. He presented to [facility] ER via EMS from [facility] on 10/8 with shortness of breath with fever suspected secondary to aspiration according to facility. EMS reported upon their arrival at [facility] they placed pt on NRB with O2 sats increasing to 95%. The pt was also hypotensive, therefore EMS started initiated levophed gtt. In the ER lab results Na+ 152, CO2 20, glucose 147, BUN 41, creatinine 1.72, anion gap 24, AST 61, ALT 82, lactic acid 7.2, and hgb 12.2. COVID-19 negative, however CXR concerning for pneumonia. Vital signs: temp 102.7 F, hr 138, rr 32, and O2 sats 94% on NRB. He met sepsis protocol therefore he received azithromycin, cefepime, clindamycin, and 2L NS bolus. He was subsequently admitted to the stepdown unit per hospitalist team for additional workup and treatment.In stepdown, pt had significantly increased work of breathing and had to be intubated.  SIGNIFICANT EVENTS/STUDIES: 10/8Presented to ICU with shortness of breath, admitted to ICU with diagnosis of sepsis given elevated lactic acid (7.2), elevated temperature (102), tachypnea, tachycardia, and evidence of bilateral infiltrates on chest X-ray. 10/8 Pt had significantly increased work of breathing requiring intubation. A central line in the left IJ was subsequently placed and a bronchoscopy was performed, obtaining a tracheal aspirate culture and clearing copious amounts of mucous. 10/9 Upon examination, ET tube was found to be displaced from the trachea and had to be replaced using a glidescope 10/10 remains intubated  CC follow up resp failure HPI Remains intubated Plan for SAT/SBT Remains critically ill Extensive amounts of  purulent secretions REVIEW OF SYSTEMS  PATIENT IS UNABLE TO PROVIDE COMPLETE REVIEW OF SYSTEM S DUE TO SEVERE CRITICAL ILLNESS AND ENCEPHALOPATHY  VITAL SIGNS: Temp:  [97.5 F (36.4 C)-98.2 F (36.8 C)] 98.2 F (36.8 C) (10/10 0400) Pulse Rate:  [26-95] 50 (10/10 0700) Resp:  [16-21] 16 (10/10 0700) BP: (85-125)/(55-96) 101/68 (10/10 0700) SpO2:  [98 %-100 %] 100 % (10/10 0724) FiO2 (%):  [28 %-50 %] 28 % (10/10 0724)   I/O last 3 completed shifts: In: 5405.3 [P.O.:60; I.V.:3541.5; NG/GT:760; IV Piggyback:1043.8] Out: 1760 [Urine:1760] Total I/O In: 580 [NG/GT:580] Out: -    SpO2: 100 % O2 Flow Rate (L/min): 10 L/min FiO2 (%): 28 %  Vent Mode: PRVC FiO2 (%):  [28 %-50 %] 28 % Set Rate:  [16 bmp] 16 bmp Vt Set:  [450 mL] 450 mL PEEP:  [5 cmH20-7 cmH20] 5 cmH20 Plateau Pressure:  [15 cmH20-16 cmH20] 16 cmH20  PHYSICAL EXAMINATION:  GENERAL:critically ill appearing, +resp distress HEAD: Normocephalic, atraumatic.  EYES: Pupils equal, round, reactive to light.  No scleral icterus.  MOUTH: Moist mucosal membrane. NECK: Supple. No thyromegaly. No nodules. No JVD.  PULMONARY: +rhonchi, +wheezing CARDIOVASCULAR: S1 and S2. Regular rate and rhythm. No murmurs, rubs, or gallops.  GASTROINTESTINAL: Soft, nontender, -distended. No masses. Positive bowel sounds. No hepatosplenomegaly.  MUSCULOSKELETAL: No swelling, clubbing, or edema.  NEUROLOGIC: obtunded SKIN:intact,warm,dry    MEDICATIONS: I have reviewed all medications and confirmed regimen as documented   CULTURE RESULTS   Recent Results (from the past 240 hour(s))  SARS Coronavirus 2 [facility] order, Performed in [facility] hospital lab) Nasopharyngeal Nasopharyngeal Swab     Status: None   Collection Time: 08/19/19  10:56 AM   Specimen: Nasopharyngeal Swab  Result Value Ref Range Status   SARS Coronavirus 2 NEGATIVE NEGATIVE Final    Comment: (NOTE) If result is NEGATIVE SARS-CoV-2 target nucleic acids are  NOT DETECTED. The SARS-CoV-2 RNA is generally detectable in upper and lower  respiratory specimens during the acute phase of infection. The lowest  concentration of SARS-CoV-2 viral copies this assay can detect is 250  copies / mL. A negative result does not preclude SARS-CoV-2 infection  and should not be used as the sole basis for treatment or other  patient management decisions.  A negative result may occur with  improper specimen collection / handling, submission of specimen other  than nasopharyngeal swab, presence of viral mutation(s) within the  areas targeted by this assay, and inadequate number of viral copies  (<250 copies / mL). A negative result must be combined with clinical  observations, patient history, and epidemiological information. If result is POSITIVE SARS-CoV-2 target nucleic acids are DETECTED. The SARS-CoV-2 RNA is generally detectable in upper and lower  respiratory specimens dur ing the acute phase of infection.  Positive  results are indicative of active infection with SARS-CoV-2.  Clinical  correlation with patient history and other diagnostic information is  necessary to determine patient infection status.  Positive results do  not rule out bacterial infection or co-infection with other viruses. If result is PRESUMPTIVE POSTIVE SARS-CoV-2 nucleic acids MAY BE PRESENT.   A presumptive positive result was obtained on the submitted specimen  and confirmed on repeat testing.  While 2019 novel coronavirus  (SARS-CoV-2) nucleic acids may be present in the submitted sample  additional confirmatory testing may be necessary for epidemiological  and / or clinical management purposes  to differentiate between  SARS-CoV-2 and other Sarbecovirus currently known to infect humans.  If clinically indicated additional testing with an alternate test  methodology [phone]) is advised. The SARS-CoV-2 RNA is generally  detectable in upper and lower respiratory sp ecimens  during the acute  phase of infection. The expected result is Negative. Fact Sheet for Patients:  StrictlyIdeas.no Fact Sheet for Healthcare Providers: BankingDealers.co.za This test is not yet approved or cleared by the [place] FDA and has been authorized for detection and/or diagnosis of SARS-CoV-2 by FDA under an Emergency Use Authorization (EUA).  This EUA will remain in effect (meaning this test can be used) for the duration of the COVID-19 declaration under Section 564(b)(1) of the Act, 21 U.S.C. section 360bbb-3(b)(1), unless the authorization is terminated or revoked sooner. Performed at [facility], [address] [place], [place] [address]   Blood Culture (routine x 2)     Status: None (Preliminary result)   Collection Time: 08/19/19 11:05 AM   Specimen: BLOOD  Result Value Ref Range Status   Specimen Description BLOOD BLOOD RIGHT HAND  Final   Special Requests   Final    BOTTLES DRAWN AEROBIC AND ANAEROBIC Blood Culture results may not be optimal due to an inadequate volume of blood received in culture bottles   Culture   Final    NO GROWTH 2 DAYS Performed at [facility], [address] [place], [place] [address]    Report Status PENDING  Incomplete  Blood Culture (routine x 2)     Status: None (Preliminary result)   Collection Time: 08/19/19 11:14 AM   Specimen: BLOOD  Result Value Ref Range Status   Specimen Description BLOOD BLOOD LEFT HAND  Final   Special Requests  Final    BOTTLES DRAWN AEROBIC AND ANAEROBIC Blood Culture adequate volume   Culture   Final    NO GROWTH 2 DAYS Performed at [facility], [address] [place], [place] [address]    Report Status PENDING  Incomplete  MRSA PCR Screening     Status: None   Collection Time: 08/19/19  3:02 PM   Specimen: Nasopharyngeal  Result Value Ref Range Status   MRSA by PCR NEGATIVE NEGATIVE Final    Comment:        The  GeneXpert MRSA Assay (FDA approved for NASAL specimens only), is one component of a comprehensive MRSA colonization surveillance program. It is not intended to diagnose MRSA infection nor to guide or monitor treatment for MRSA infections. Performed at [facility], [address] [place], [place] [address]   Urine culture     Status: None   Collection Time: 08/19/19  3:52 PM   Specimen: In/Out Cath Urine  Result Value Ref Range Status   Specimen Description   Final    IN/OUT CATH URINE Performed at [facility], [address], [place], [place] [address]    Special Requests   Final    NONE Performed at [facility], [address] [place], [place] [address]    Culture   Final    NO GROWTH Performed at [facility], [place] [address], [place], [place] [address]    Report Status 08/20/2019 FINAL  Final  Culture, bal-quantitative     Status: None (Preliminary result)   Collection Time: 08/19/19  5:06 PM   Specimen: Bronchoalveolar Lavage; Respiratory  Result Value Ref Range Status   Specimen Description   Final    BRONCHIAL ALVEOLAR LAVAGE Performed at [facility], [address] [place], [place] [address]    Special Requests   Final    NONE Performed at [facility], [address] [place], [place] [address]    Gram Stain   Final    FEW NO ORGANISMS SEEN RARE GRAM POSITIVE COCCI IN PAIRS    Culture   Final    CULTURE REINCUBATED FOR BETTER GROWTH Performed at [facility], [place] [address] [place], [place] [address]    Report Status PENDING  Incomplete          IMAGING    Dg Chest Port 1 View  Result Date: 08/20/2019 CLINICAL DATA:  Respiratory failure. EXAM: PORTABLE CHEST 1 VIEW COMPARISON:  Same day. FINDINGS: The heart size and mediastinal contours are within normal limits. Nasogastric tube is seen entering stomach. Endotracheal tube is seen entering right mainstem bronchus; withdrawal by 3-4  cm is recommended. Left internal jugular catheter is unchanged in position. No pneumothorax is noted. No pleural effusion is noted. Bibasilar opacities are noted, right greater than left. The visualized skeletal structures are unremarkable. IMPRESSION: Endotracheal tube is directed into right mainstem bronchus; withdrawal by 3-4 cm is recommended. Critical Value/emergent results were called by telephone at the time of interpretation on 08/20/2019 at 8:33 am to [facility], who verbally acknowledged these results. Otherwise stable support apparatus. Bibasilar opacities are noted, right greater than left, concerning for pneumonia or atelectasis. Electronically Signed   By: Marijo Conception M.D.   On: 08/20/2019 08:34       Indwelling Urinary Catheter continued, requirement due to   Reason to continue Indwelling Urinary Catheter strict Intake/Output monitoring for hemodynamic instability   Central Line/ continued, requirement due to  Reason to continue [employer] of central venous pressure or other hemodynamic parameters and poor IV access   Ventilator continued, requirement due to severe respiratory failure   Ventilator Sedation RASS 0 to -2      ASSESSMENT AND PLAN SYNOPSIS   58 yo male with Down Syndrome admitted to the ICU for acute hypoxic respiratory failure secondary to bilateral pneumonia and septic shock.   Severe ACUTE Hypoxic and Hypercapnic Respiratory Failure -continue Mechanical Ventilator support -continue Bronchodilator Therapy -Wean Fio2 and PEEP as tolerated -VAP/VENT bundle implementation -will perform SAT/SBT when respiratory parameters are met   +PNEUMONIA FEW NO ORGANISMS SEEN  RARE GRAM POSITIVE COCCI IN PAIRS  Continue IV abx as prescribed    NEUROLOGY - intubated and sedated - minimal sedation to achieve a RASS goal: -1   CARDIAC ICU monitoring   GI GI PROPHYLAXIS as indicated  NUTRITIONAL STATUS DIET-->TF's as tolerated  Constipation protocol as indicated  ELECTROLYTES -follow labs as needed -replace as needed -pharmacy consultation and following    DVT/GI PRX ordered TRANSFUSIONS AS NEEDED MONITOR FSBS ASSESS the need for LABS as needed     Critical Care Time devoted to patient care services described in this note is 34 minutes.   Overall, patient is critically ill, prognosis is guarded.     [name], M.D.  [facility]  Medical Director [place] Director [facility] Cardio-Pulmonary Department

## 2019-08-21 NOTE — Progress Notes (Signed)
Pharmacy Antibiotic Note  Joseph Flores is a 58 y.o. male admitted on 08/19/2019 with pneumonia.  Patient resides at [facility] and has history significant for Down's syndrome, arthritis, and degenerative disc. Patient with elevated PCT. Pharmacy has been consulted for vancomycin and cefepime dosing. Vancomycin 792m IV Q24hr was discontinue on 10/08 due to negative MRSA PCR. Day 3 of abx. F/u with cultures.   -PCT levels trending down -WBC trending up, on stress dose steroids -BUN and Scr trending down -CXR showed bibasilar opacities that are greater on the right lung, with concerns for pneumonia or atelectasis  Plan: -Increase cefepime 2g IV Q12hr to q8H (renally adjusted) -Continue azithromycin 5016mIV Q24hr x 5 doses  Monitor: -Renal dose adjustments as indicated -CAP duration of therapy is at least 5 days -Continue to monitor for signs of improvement and adverse effects   Height: _0  (170.2 cm) Weight: 118 lb 6.2 oz (53.7 kg) IBW/kg (Calculated) : 66.1  Temp (24hrs), Avg:97.9 F (36.6 C), Min:97.5 F (36.4 C), Max:98.2 F (36.8 C)  Recent Labs  Lab 08/19/19 1106 08/19/19 1345 08/19/19 1445 08/19/19 2019 08/20/19 0125 08/20/19 0338 08/21/19 0354  WBC 8.7  --   --   --   --  17.3* 12.9*  CREATININE 1.72*  --  1.81*  --   --  1.13 0.83  LATICACIDVEN 7.2* 8.7*  --  8.1* 3.9* 3.1*  --     Estimated Creatinine Clearance: 74.6 mL/min (by C-G formula based on SCr of 0.83 mg/dL).    No Known Allergies  Antimicrobials this admission: Azithromycin 10/08 >> 10/12 Cefepime 10/08 >>  Vancomycin 10/08 >>10/08 Clindamycin 10/08 x 1.   Dose adjustments this admission: N/A  Microbiology results: 10/08 BCx: final cultures pending, no growth <24hr 10/08 UCx: final, no growth 10/08 BAL: final cultures pending, rare gram positive cocci  10/08 MRSA PCR: negative  10/08 COVID: negative   Thank you for allowing pharmacy to be a part of this patient's  care.  KiOswald HillockPharmD, BCPS 08/21/2019 8:39 AM

## 2019-08-22 LAB — RENAL FUNCTION PANEL
Albumin: 2.4 g/dL — ABNORMAL LOW (ref 3.5–5.0)
Anion gap: 10 (ref 5–15)
BUN: 22 mg/dL — ABNORMAL HIGH (ref 6–20)
CO2: 29 mmol/L (ref 22–32)
Calcium: 8.1 mg/dL — ABNORMAL LOW (ref 8.9–10.3)
Chloride: 104 mmol/L (ref 98–111)
Creatinine, Ser: 0.83 mg/dL (ref 0.61–1.24)
GFR calc Af Amer: >60 mL/min (ref >60)
GFR calc non Af Amer: >60 mL/min (ref >60)
Glucose, Bld: 127 mg/dL — ABNORMAL HIGH (ref 70–99)
Phosphorus: 1.9 mg/dL — ABNORMAL LOW (ref 2.5–4.6)
Potassium: 2.8 mmol/L — ABNORMAL LOW (ref 3.5–5.1)
Sodium: 143 mmol/L (ref 135–145)

## 2019-08-22 LAB — CBC
HCT: 26.4 % — ABNORMAL LOW (ref 39.0–52.0)
Hemoglobin: 8.3 g/dL — ABNORMAL LOW (ref 13.0–17.0)
MCH: 29.3 pg (ref 26.0–34.0)
MCHC: 31.4 g/dL (ref 30.0–36.0)
MCV: 93.3 fL (ref 80.0–100.0)
Platelets: 185 10*3/uL (ref 150–400)
RBC: 2.83 MIL/uL — ABNORMAL LOW (ref 4.22–5.81)
RDW: 15.9 % — ABNORMAL HIGH (ref 11.5–15.5)
WBC: 15.7 10*3/uL — ABNORMAL HIGH (ref 4.0–10.5)
nRBC: 0.2 % (ref 0.0–0.2)

## 2019-08-22 LAB — GLUCOSE, CAPILLARY
Glucose-Capillary: 118 mg/dL — ABNORMAL HIGH (ref 70–99)
Glucose-Capillary: 125 mg/dL — ABNORMAL HIGH (ref 70–99)
Glucose-Capillary: 136 mg/dL — ABNORMAL HIGH (ref 70–99)
Glucose-Capillary: 143 mg/dL — ABNORMAL HIGH (ref 70–99)
Glucose-Capillary: 167 mg/dL — ABNORMAL HIGH (ref 70–99)

## 2019-08-22 LAB — LEGIONELLA PNEUMOPHILA SEROGP 1 UR AG: L. pneumophila Serogp 1 Ur Ag: NEGATIVE

## 2019-08-22 MED ORDER — INSULIN ASPART 100 UNIT/ML ~~LOC~~ SOLN: 0.0000 [IU] | Freq: Every day | SUBCUTANEOUS | Status: DC

## 2019-08-22 MED ORDER — POTASSIUM PHOSPHATES 15 MMOLE/5ML IV SOLN
10.0000 mmol | Freq: Once | INTRAVENOUS | Status: AC
  Administered 2019-08-22: 10 mmol via INTRAVENOUS
  Filled 2019-08-22: qty 3.33

## 2019-08-22 MED ORDER — POTASSIUM CHLORIDE CRYS ER 20 MEQ PO TBCR
20.0000 meq | EXTENDED_RELEASE_TABLET | Freq: Two times a day (BID) | ORAL | Status: DC
  Administered 2019-08-22 – 2019-08-24 (×4): 20 meq via ORAL
  Filled 2019-08-22 (×4): qty 1

## 2019-08-22 MED ORDER — GERHARDT'S BUTT CREAM
TOPICAL_CREAM | Freq: Three times a day (TID) | CUTANEOUS | Status: DC
  Administered 2019-08-22: 1 via TOPICAL
  Administered 2019-08-22 – 2019-08-24 (×6): via TOPICAL
  Filled 2019-08-22: qty 1

## 2019-08-22 MED ORDER — POTASSIUM CHLORIDE 10 MEQ/100ML IV SOLN
10.0000 meq | INTRAVENOUS | Status: AC
  Administered 2019-08-22 (×4): 10 meq via INTRAVENOUS
  Filled 2019-08-22 (×4): qty 100

## 2019-08-22 MED ORDER — INSULIN ASPART 100 UNIT/ML ~~LOC~~ SOLN
0.0000 [IU] | Freq: Three times a day (TID) | SUBCUTANEOUS | Status: DC
  Administered 2019-08-23: 2 [IU] via SUBCUTANEOUS
  Administered 2019-08-23 – 2019-08-24 (×2): 1 [IU] via SUBCUTANEOUS
  Filled 2019-08-22 (×3): qty 1

## 2019-08-22 NOTE — Plan of Care (Signed)
Patient transferred to unit from ICU.  No s/s of distress noted.  Sister at bedside.     Problem: Education: Goal: Knowledge of General Education information will improve Description: Including pain rating scale, medication(s)/side effects and non-pharmacologic comfort measures Outcome: Progressing   Problem: Health Behavior/Discharge Planning: Goal: Ability to manage health-related needs will improve Outcome: Progressing   Problem: Clinical Measurements: Goal: Ability to maintain clinical measurements within normal limits will improve Outcome: Progressing Goal: Will remain free from infection Outcome: Progressing Goal: Diagnostic test results will improve Outcome: Progressing Goal: Respiratory complications will improve Outcome: Progressing Goal: Cardiovascular complication will be avoided Outcome: Progressing   Problem: Activity: Goal: Risk for activity intolerance will decrease Outcome: Progressing   Problem: Nutrition: Goal: Adequate nutrition will be maintained Outcome: Progressing   Problem: Coping: Goal: Level of anxiety will decrease Outcome: Progressing   Problem: Elimination: Goal: Will not experience complications related to bowel motility Outcome: Progressing Goal: Will not experience complications related to urinary retention Outcome: Progressing   Problem: Pain Managment: Goal: General experience of comfort will improve Outcome: Progressing   Problem: Safety: Goal: Ability to remain free from injury will improve Outcome: Progressing   Problem: Skin Integrity: Goal: Risk for impaired skin integrity will decrease Outcome: Progressing

## 2019-08-22 NOTE — Progress Notes (Signed)
Pharmacy Antibiotic Note  Joseph Flores is a 58 y.o. male admitted on 08/19/2019 with pneumonia.  Patient resides at [facility] and has history significant for Down's syndrome, arthritis, and degenerative disc. Patient with elevated PCT. Pharmacy has been consulted for vancomycin and cefepime dosing. Vancomycin 777m IV Q24hr was discontinue on 10/08 due to negative MRSA PCR. Day 4 of abx. F/u with cultures - grew normal resp flora.   -PCT levels trending down -WBC trending up, on stress dose steroids -BUN and Scr trending down -CXR showed bibasilar opacities that are greater on the right lung, with concerns for pneumonia or atelectasis  Plan: -Increase cefepime 2g IV Q12hr to q8H (renally adjusted) -Continue azithromycin 5041mIV Q24hr x 5 doses  Monitor: -Renal dose adjustments as indicated -CAP duration of therapy is at least 5 days -Continue to monitor for signs of improvement and adverse effects   Height: _0  (170.2 cm) Weight: 118 lb 6.2 oz (53.7 kg) IBW/kg (Calculated) : 66.1  Temp (24hrs), Avg:98.5 F (36.9 C), Min:98 F (36.7 C), Max:98.9 F (37.2 C)  Recent Labs  Lab 08/19/19 1106 08/19/19 1345 08/19/19 1445 08/19/19 2019 08/20/19 0125 08/20/19 0338 08/21/19 0354 08/22/19 0629  WBC 8.7  --   --   --   --  17.3* 12.9* 15.7*  CREATININE 1.72*  --  1.81*  --   --  1.13 0.83 0.83  LATICACIDVEN 7.2* 8.7*  --  8.1* 3.9* 3.1*  --   --     Estimated Creatinine Clearance: 74.6 mL/min (by C-G formula based on SCr of 0.83 mg/dL).    No Known Allergies  Antimicrobials this admission: Azithromycin 10/08 >> 10/12 Cefepime 10/08 >>  Vancomycin 10/08 >>10/08 Clindamycin 10/08 x 1.   Dose adjustments this admission: N/A  Microbiology results: 10/08 BCx: final cultures pending, no growth <24hr 10/08 UCx: final, no growth 10/08 BAL: final cultures pending, rare gram positive cocci  10/08 MRSA PCR: negative  10/08 COVID: negative   Thank you for allowing  pharmacy to be a part of this patient's care.  KiOswald HillockPharmD, BCPS 08/22/2019 8:03 AM

## 2019-08-22 NOTE — Consult Note (Signed)
[facility] Nurse wound consult note Reason for Consult:Perineum, buttocks, scrotum and medial thighs with IAD, at risk for heel and ankle pressure injuries Wound type:moisture Pressure Injury POA: Yes (lLeft lateral malleolus Stage 1 on admission. Now resolved Measurement:N/A Wound bed:N/A Drainage (amount, consistency, odor) N'A Periwound:N/A Dressing procedure/placement/frequency:Patient is on a mattress replacement with low air loss feature while in ICU. I have provided guidance for turning and repositioning and a topical cream (compounded, 1:1:1 lotrimin, hydrocortisone cream and zinc oxide) three times daily.  [place] nursing team will not follow, but will remain available to this patient, the nursing and medical teams.  Please re-consult if needed. Thanks, Maudie Flakes, MSN, RN, [place], Arther Abbott  Pager# [phone]

## 2019-08-22 NOTE — Progress Notes (Signed)
[facility] NAME: Joseph Flores    MR#:  [id number]  DATE OF BIRTH:  1960/12/05  SUBJECTIVE:  CHIEF COMPLAINT:   Chief Complaint  Patient presents with  . Shortness of Breath   Sent from nursing facility for suspected aspiration episode and hypoxia.  Intubated by ICU team on arrival. Extubated 08/21/19 Alert, nonverbal but making some shouting like noise because of his baseline Down syndrome. The nurses have noted some difficulty in swallowing, while giving thickened liquids to him.  REVIEW OF SYSTEMS:  Due to baseline Down syndrome patient cannot give reliable review of system.  Does not appear in any discomfort.  ROS  DRUG ALLERGIES:  No Known Allergies  VITALS:  Blood pressure 127/69, pulse (!) 59, temperature 99.1 F (37.3 C), temperature source Oral, resp. rate (!) 24, height 5\' 7"  (1.702 m), weight 53.7 kg, SpO2 95 %.  PHYSICAL EXAMINATION:  GENERAL:  58 y.o.-year-old patient lying in the bed , resting comfortably.Marland Kitchen  EYES: Pupils equal, round, reactive to light . No scleral icterus. Extraocular muscles intact.  HEENT: Head atraumatic, normocephalic. Oropharynx and nasopharynx clear.  NECK:  Supple, no jugular venous distention. No thyroid enlargement, no tenderness.  LUNGS: Normal breath sounds bilaterally, no wheezing, rales,rhonchi or crepitation. No use of accessory muscles of respiration.   CARDIOVASCULAR: S1, S2 normal. No murmurs, rubs, or gallops.  ABDOMEN: Soft, nontender, nondistended. Bowel sounds present. No organomegaly or mass.  EXTREMITIES: No pedal edema, cyanosis, or clubbing.  NEUROLOGIC: He is looking at me but noncommunicative due to his baseline Down syndrome.  He does not follow commands and he moves his limbs a little spontaneously. Have atrophic lower limbs. PSYCHIATRIC: The patient is alert and oriented x 0.  SKIN: No obvious rash, lesion, or ulcer.   Physical Exam LABORATORY PANEL:    CBC Recent Labs  Lab 08/22/19 0629  WBC 15.7*  HGB 8.3*  HCT 26.4*  PLT 185   ------------------------------------------------------------------------------------------------------------------  Chemistries  Recent Labs  Lab 08/20/19 0338 08/21/19 0354 08/22/19 0629  NA 149* 145 143  K 4.5 3.6 2.8*  CL 110 111 104  CO2 27 30 29   GLUCOSE 175* 171* 127*  BUN 30* 26* 22*  CREATININE 1.13 0.83 0.83  CALCIUM 7.9* 8.1* 8.1*  MG 1.9 2.1  --   AST 38  --   --   ALT 62*  --   --   ALKPHOS 40  --   --   BILITOT 0.7  --   --    ------------------------------------------------------------------------------------------------------------------  Cardiac Enzymes No results for input(s): TROPONINI in the last 168 hours. ------------------------------------------------------------------------------------------------------------------  RADIOLOGY:  Dg Chest Port 1 View  Result Date: 08/21/2019 CLINICAL DATA:  Acute respiratory failure. EXAM: PORTABLE CHEST 1 VIEW COMPARISON:  August 20, 2019. FINDINGS: The heart size and mediastinal contours are within normal limits. Endotracheal and nasogastric tubes are in grossly good position. Left internal jugular catheter is unchanged in position. No pneumothorax or pleural effusion is noted. Stable bibasilar opacities are noted concerning for pneumonia. The visualized skeletal structures are unremarkable. IMPRESSION: Endotracheal and nasogastric tubes are in grossly good position. Stable bibasilar opacities as described above. Electronically Signed   By: Marijo Conception M.D.   On: 08/21/2019 09:09    ASSESSMENT AND PLAN:   Active Problems:   Acute respiratory failure ([place])   Sepsis ([place])   Community acquired pneumonia   Pressure injury of skin   Goals of care, counseling/discussion  Palliative care by specialist   * Acute respiratory failure with hypoxia due to sepsis and aspiration pneumonia. Status post intubation, continue  ventilatory support and manage per ICU team. Broad-spectrum antibiotics. Change to azithromycin and cefepime per ICU. Extubated per ICU- 08/21/19 Stable on room air now.  * Hypotension due to sepsis.  Continue IV fluid support, normal saline bolus and Levophed . Improved and stable now.  * Lactic acidosis due to sepsis.  Treatment as above and follow-up lactic acid level.  * Hypernatremia.  Half-normal saline IV and follow-up BMP.  * Acute renal failure on CKD, IV fluid support and follow-up BMP. Improved.  * Abnormal liver function tests due to sepsis, follow-up CMP.  *Aspiration-we will get swallow evaluation now as he is extubated and appears to be baseline now.  All the records are reviewed and case discussed with Care Management/Social Workerr. Management plans discussed with the patient, family and they are in agreement.  CODE STATUS: Full code  TOTAL TIME TAKING CARE OF THIS PATIENT: 35 minutes.     POSSIBLE D/C IN 1-2 DAYS, DEPENDING ON CLINICAL CONDITION.   Vaughan Basta M.D on 08/22/2019   Between 7am to 6pm - Pager - [phone]  After 6pm go to [email or web address] - password [email or web address] [facility] [facility]  Office  [phone]  CC: Primary care physician; Dianne Dun, MD  Note: This dictation was prepared with Dragon dictation along with smaller phrase technology. Any transcriptional errors that result from this process are unintentional.

## 2019-08-22 NOTE — Progress Notes (Signed)
 Name: Joseph Flores MRN: [id number] DOB: 09/23/1961     CONSULTATION DATE: 08/20/2019  CHIEF COMPLAINT:  Shortness of breath  SYNOPSIS: Joseph Flores is a 58 yo male with a PMH of Down's Syndrome, Degeneration of Intervertebral Disc of Lumbar Region, and Arthritis. He presented to [facility] ER via EMS from [facility] on 10/8 with shortness of breath with fever suspected secondary to aspiration according to facility. EMS reported upon their arrival at [facility] they placed pt on NRB with O2 sats increasing to 95%. The pt was also hypotensive, therefore EMS started initiated levophed gtt. In the ER lab results Na+ 152, CO2 20, glucose 147, BUN 41, creatinine 1.72, anion gap 24, AST 61, ALT 82, lactic acid 7.2, and hgb 12.2. COVID-19 negative, however CXR concerning for pneumonia. Vital signs: temp 102.7 F, hr 138, rr 32, and O2 sats 94% on NRB. He met sepsis protocol therefore he received azithromycin, cefepime, clindamycin, and 2L NS bolus. He was subsequently admitted to the stepdown unit per hospitalist team for additional workup and treatment.In stepdown, pt had significantly increased work of breathing and had to be intubated.  SIGNIFICANT EVENTS/STUDIES: 10/8Presented to ICU with shortness of breath, admitted to ICU with diagnosis of sepsis given elevated lactic acid (7.2), elevated temperature (102), tachypnea, tachycardia, and evidence of bilateral infiltrates on chest X-ray. 10/8 Pt had significantly increased work of breathing requiring intubation. A central line in the left IJ was subsequently placed and a bronchoscopy was performed, obtaining a tracheal aspirate culture and clearing copious amounts of mucous. 10/9 Upon examination, ET tube was found to be displaced from the trachea and had to be replaced using a glidescope 10/10 remains intubated 10/10 extubated  CC Follow up pneumonia  HPI S/p extubation No oxygen needs at this time Severe cognitive  dysfunction  ROS unobtainable  Physical Examination:   GENERAL:NAD, no fevers, chills, no weakness no fatigue HEAD: Normocephalic, atraumatic.  EYES: PERLA, EOMI No scleral icterus.  MOUTH: Moist mucosal membrane.  EAR, NOSE, THROAT: Clear without exudates. No external lesions.  NECK: Supple. No thyromegaly.  No JVD.  PULMONARY: CTA B/L no wheezing, rhonchi, crackles CARDIOVASCULAR: S1 and S2. Regular rate and rhythm. No murmurs GASTROINTESTINAL: Soft, nontender, nondistended. Positive bowel sounds.  MUSCULOSKELETAL: contractures ALL OTHER ROS ARE NEGATIVE      VITAL SIGNS: Temp:  [98.2 F (36.8 C)-99.1 F (37.3 C)] 99.1 F (37.3 C) (10/11 0800) Pulse Rate:  [51-77] 59 (10/11 0400) Resp:  [16-28] 24 (10/11 0700) BP: (94-159)/(60-103) 159/95 (10/11 0600) SpO2:  [90 %-99 %] 95 % (10/11 0600) Weight:  [53.7 kg] 53.7 kg (10/11 0600)   I/O last 3 completed shifts: In: 4850.8 [I.V.:3160.8; NG/GT:1140; IV Piggyback:550] Out: 2900 [Urine:2900] Total I/O In: -  Out: 300 [Urine:300]   SpO2: 95 % O2 Flow Rate (L/min): 10 L/min FiO2 (%): 28 %     MEDICATIONS: I have reviewed all medications and confirmed regimen as documented   CULTURE RESULTS   Recent Results (from the past 240 hour(s))  SARS Coronavirus 2 (Hospital order, Performed in [facility] hospital lab) Nasopharyngeal Nasopharyngeal Swab     Status: None   Collection Time: 08/19/19 10:56 AM   Specimen: Nasopharyngeal Swab  Result Value Ref Range Status   SARS Coronavirus 2 NEGATIVE NEGATIVE Final    Comment: (NOTE) If result is NEGATIVE SARS-CoV-2 target nucleic acids are NOT DETECTED. The SARS-CoV-2 RNA is generally detectable in upper and lower  respiratory specimens during the acute phase of   infection. The lowest  concentration of SARS-CoV-2 viral copies this assay can detect is 250  copies / mL. A negative result does not preclude SARS-CoV-2 infection  and should not be used as the sole basis for  treatment or other  patient management decisions.  A negative result may occur with  improper specimen collection / handling, submission of specimen other  than nasopharyngeal swab, presence of viral mutation(s) within the  areas targeted by this assay, and inadequate number of viral copies  (<250 copies / mL). A negative result must be combined with clinical  observations, patient history, and epidemiological information. If result is POSITIVE SARS-CoV-2 target nucleic acids are DETECTED. The SARS-CoV-2 RNA is generally detectable in upper and lower  respiratory specimens dur ing the acute phase of infection.  Positive  results are indicative of active infection with SARS-CoV-2.  Clinical  correlation with patient history and other diagnostic information is  necessary to determine patient infection status.  Positive results do  not rule out bacterial infection or co-infection with other viruses. If result is PRESUMPTIVE POSTIVE SARS-CoV-2 nucleic acids MAY BE PRESENT.   A presumptive positive result was obtained on the submitted specimen  and confirmed on repeat testing.  While 2019 novel coronavirus  (SARS-CoV-2) nucleic acids may be present in the submitted sample  additional confirmatory testing may be necessary for epidemiological  and / or clinical management purposes  to differentiate between  SARS-CoV-2 and other Sarbecovirus currently known to infect humans.  If clinically indicated additional testing with an alternate test  methodology ([id number]) is advised. The SARS-CoV-2 RNA is generally  detectable in upper and lower respiratory sp ecimens during the acute  phase of infection. The expected result is Negative. Fact Sheet for Patients:  https://www.fda.gov/media/136312/download Fact Sheet for Healthcare Providers: https://www.fda.gov/media/136313/download This test is not yet approved or cleared by the [place] FDA and has been authorized for detection and/or  diagnosis of SARS-CoV-2 by FDA under an Emergency Use Authorization (EUA).  This EUA will remain in effect (meaning this test can be used) for the duration of the COVID-19 declaration under Section 564(b)(1) of the Act, 21 U.S.C. section 360bbb-3(b)(1), unless the authorization is terminated or revoked sooner. Performed at [facility], [address] [place], [place] [address]   Blood Culture (routine x 2)     Status: None (Preliminary result)   Collection Time: 08/19/19 11:05 AM   Specimen: BLOOD  Result Value Ref Range Status   Specimen Description BLOOD BLOOD RIGHT HAND  Final   Special Requests   Final    BOTTLES DRAWN AEROBIC AND ANAEROBIC Blood Culture results may not be optimal due to an inadequate volume of blood received in culture bottles   Culture   Final    NO GROWTH 3 DAYS Performed at [facility], [address] [place], [place] [address]    Report Status PENDING  Incomplete  Blood Culture (routine x 2)     Status: None (Preliminary result)   Collection Time: 08/19/19 11:14 AM   Specimen: BLOOD  Result Value Ref Range Status   Specimen Description BLOOD BLOOD LEFT HAND  Final   Special Requests   Final    BOTTLES DRAWN AEROBIC AND ANAEROBIC Blood Culture adequate volume   Culture   Final    NO GROWTH 3 DAYS Performed at [facility], [address] [place],  [address]    Report Status PENDING  Incomplete  MRSA PCR Screening     Status:   None   Collection Time: 08/19/19  3:02 PM   Specimen: Nasopharyngeal  Result Value Ref Range Status   MRSA by PCR NEGATIVE NEGATIVE Final    Comment:        The GeneXpert MRSA Assay (FDA approved for NASAL specimens only), is one component of a comprehensive MRSA colonization surveillance program. It is not intended to diagnose MRSA infection nor to guide or monitor treatment for MRSA infections. Performed at [facility], [address] [place], [place] [address]    Urine culture     Status: None   Collection Time: 08/19/19  3:52 PM   Specimen: In/Out Cath Urine  Result Value Ref Range Status   Specimen Description   Final    IN/OUT CATH URINE Performed at [facility], [address] [place], [place] [address]    Special Requests   Final    NONE Performed at [facility], [address] [place], [place] [address]    Culture   Final    NO GROWTH Performed at [facility], [address] [place], [place] [address]    Report Status 08/20/2019 FINAL  Final  Culture, bal-quantitative     Status: Abnormal   Collection Time: 08/19/19  5:06 PM   Specimen: Bronchoalveolar Lavage; Respiratory  Result Value Ref Range Status   Specimen Description   Final    BRONCHIAL ALVEOLAR LAVAGE Performed at [facility], [address] [place], [place] [address]    Special Requests   Final    NONE Performed at [facility], [address] [place], [place]  [address]    Gram Stain   Final    FEW NO ORGANISMS SEEN RARE GRAM POSITIVE COCCI IN PAIRS    Culture (A)  Final    >=100,000 COLONIES/mL Consistent with normal respiratory flora. Performed at [facility], [address] [place],  [address]    Report Status 08/21/2019 FINAL  Final         ASSESSMENT AND PLAN SYNOPSIS   58 yo male with Down Syndrome admitted to the ICU for acute hypoxic respiratory failure secondary to bilateral pneumonia and septic shock.  resp failure Severe ACUTE Hypoxic and Hypercapnic Respiratory Failure-RESOLVED    +PNEUMONIA >=100,000 COLONIES/mL Consistent with normal respiratory flora FEW NO ORGANISMS SEEN  RARE GRAM POSITIVE COCCI IN PAIRS  Continue IV abx as prescribed    NEUROLOGY Severe dementia and cognitive dysfunction  Speech eval  Transfer to gen med floor    David , M.D.   Pulmonary & Critical Care Medicine  Medical Director ICU-[facility] [place] Medical Director  [facility] Cardio-Pulmonary Department           

## 2019-08-22 NOTE — Progress Notes (Signed)
Pharmacy Electrolyte Monitoring Consult:  Pharmacy consulted to assist in monitoring and replacing electrolytes in this 58 y.o. male admitted on 08/19/2019 with sepsis. Patient subsequently intubated upon admission to ICU. Patient's sedation regimen includes continuous fentanyl infusion. Patient with past medical history significant for Down's syndrome, arthritis, and degenerative disc. Patient taking prednisone 40mg  daily as an outpatient.   Labs:  Sodium (mmol/L)  Date Value  08/22/2019 143  12/06/2015 138  08/26/2014 142   Potassium (mmol/L)  Date Value  08/22/2019 2.8 (L)  08/26/2014 3.4 (L)   Magnesium (mg/dL)  Date Value  08/21/2019 2.1   Phosphorus (mg/dL)  Date Value  08/22/2019 1.9 (L)   Calcium (mg/dL)  Date Value  08/22/2019 8.1 (L)   Calcium, Total (mg/dL)  Date Value  08/26/2014 8.0 (L)   Albumin (g/dL)  Date Value  08/22/2019 2.4 (L)  12/06/2015 3.4 (L)  02/04/2012 3.2 (L)  Corrected calcium: 9 mg/dl  Assessment/Plan: 1. Electrolytes: Potassium 2.8 and Phos 1.9.  - Will replace w/ Potassium phosphate 10 mmol IV x 1.  - Will give potassium 10 mEq x 4 IV.   2. Glucose: Patient with no history of diabetes. Last A1c from 06/2017 was 6.0 (pre-diabetes). Patient is on stress dose steroids. Insulin therapy with SSI Q4hr. Glucose trending up. BG 125.  -Continue to monitor glucose trend -Consider adding basal insulin if glucose continues to trend up -Continue to assess need for steroids  3. Constipation: LBM 10/11. Patient is starting tube feeds today. Constipating medication includes Fentanyl  -Continue senna/docusate VT BID.  -Monitor for loose stools while on ABX.    Pharmacy will continue to monitor and adjust per consult.   Eleonore Chiquito, PharmD, BCPS Clinical Pharmacist 08/22/2019 7:51 AM

## 2019-08-23 LAB — BASIC METABOLIC PANEL
Anion gap: 11 (ref 5–15)
BUN: 15 mg/dL (ref 6–20)
CO2: 30 mmol/L (ref 22–32)
Calcium: 7.9 mg/dL — ABNORMAL LOW (ref 8.9–10.3)
Chloride: 99 mmol/L (ref 98–111)
Creatinine, Ser: 0.68 mg/dL (ref 0.61–1.24)
GFR calc Af Amer: >60 mL/min (ref >60)
GFR calc non Af Amer: >60 mL/min (ref >60)
Glucose, Bld: 113 mg/dL — ABNORMAL HIGH (ref 70–99)
Potassium: 2.4 mmol/L — CL (ref 3.5–5.1)
Sodium: 140 mmol/L (ref 135–145)

## 2019-08-23 LAB — GLUCOSE, CAPILLARY
Glucose-Capillary: 109 mg/dL — ABNORMAL HIGH (ref 70–99)
Glucose-Capillary: 181 mg/dL — ABNORMAL HIGH (ref 70–99)
Glucose-Capillary: 148 mg/dL — ABNORMAL HIGH (ref 70–99)
Glucose-Capillary: 193 mg/dL — ABNORMAL HIGH (ref 70–99)

## 2019-08-23 LAB — PHOSPHORUS: Phosphorus: 2.4 mg/dL — ABNORMAL LOW (ref 2.5–4.6)

## 2019-08-23 LAB — MAGNESIUM: Magnesium: 2.1 mg/dL (ref 1.7–2.4)

## 2019-08-23 LAB — POTASSIUM: Potassium: 3.2 mmol/L — ABNORMAL LOW (ref 3.5–5.1)

## 2019-08-23 LAB — TSH: TSH: 0.798 u[IU]/mL (ref 0.350–4.500)

## 2019-08-23 MED ORDER — PREDNISONE 20 MG PO TABS
40.0000 mg | ORAL_TABLET | Freq: Every day | ORAL | Status: AC
  Administered 2019-08-24: 40 mg via ORAL
  Filled 2019-08-23: qty 2

## 2019-08-23 MED ORDER — PREDNISONE 20 MG PO TABS: 30.0000 mg | ORAL_TABLET | Freq: Every day | ORAL | Status: DC

## 2019-08-23 MED ORDER — PREDNISONE 10 MG PO TABS: 10.0000 mg | ORAL_TABLET | Freq: Every day | ORAL | Status: DC

## 2019-08-23 MED ORDER — PREDNISONE 20 MG PO TABS: 20.0000 mg | ORAL_TABLET | Freq: Every day | ORAL | Status: DC

## 2019-08-23 MED ORDER — POTASSIUM CHLORIDE 10 MEQ/100ML IV SOLN
10.0000 meq | INTRAVENOUS | Status: AC
  Administered 2019-08-23 (×3): 10 meq via INTRAVENOUS
  Filled 2019-08-23 (×4): qty 100

## 2019-08-23 MED ORDER — TAMSULOSIN HCL 0.4 MG PO CAPS
0.4000 mg | ORAL_CAPSULE | Freq: Every day | ORAL | Status: DC
  Administered 2019-08-23 – 2019-08-24 (×2): 0.4 mg via ORAL
  Filled 2019-08-23 (×2): qty 1

## 2019-08-23 MED ORDER — POTASSIUM CHLORIDE CRYS ER 20 MEQ PO TBCR
40.0000 meq | EXTENDED_RELEASE_TABLET | Freq: Once | ORAL | Status: AC
  Administered 2019-08-23: 40 meq via ORAL
  Filled 2019-08-23: qty 2

## 2019-08-23 MED ORDER — KCL IN DEXTROSE-NACL 40-5-0.9 MEQ/L-%-% IV SOLN
INTRAVENOUS | Status: DC
  Administered 2019-08-23 – 2019-08-24 (×3): via INTRAVENOUS
  Filled 2019-08-23 (×6): qty 1000

## 2019-08-23 MED ORDER — K PHOS MONO-SOD PHOS DI & MONO 155-852-130 MG PO TABS
500.0000 mg | ORAL_TABLET | ORAL | Status: AC
  Administered 2019-08-23 (×2): 500 mg via ORAL
  Filled 2019-08-23 (×2): qty 2

## 2019-08-23 NOTE — Progress Notes (Signed)
Unable to draw labs.

## 2019-08-23 NOTE — Progress Notes (Signed)
Nutrition Follow-up  DOCUMENTATION CODES:   Underweight  INTERVENTION:  Provide honey-thick Mighty Shake po TID with meals, each supplement provides 200 kcal and 6 grams of protein.  Provide Magic cup TID with meals, each supplement provides 290 kcal and 9 grams of protein.  NUTRITION DIAGNOSIS:   Inadequate oral intake related to inability to eat(pt sedated and ventilated) as evidenced by NPO status.  Resolving - diet now advanced.  GOAL:   Patient will meet greater than or equal to 90% of their needs  Progressing.  MONITOR:   PO intake, Supplement acceptance, Labs, Weight trends, Skin, I & O's  REASON FOR ASSESSMENT:   Consult Enteral/tube feeding initiation and management  ASSESSMENT:   58 yo male with a PMH of Down's Syndrome, Degeneration of Intervertebral Disc of Lumbar Region, and Arthritis.  He presented to [facility] ER via EMS from [facility] on 10/8 with shortness of breath with fever and found to have aspiration PNA and septic shock  Patient was extubated on 10/10. Patient was having difficulty swallowing over the weekend so SLP consult was ordered. After evaluation today diet was advanced to dysphagia 1 with honey thick liquids by tsp only. Patient will require assistance with meals.  Medications reviewed and include: Colace 100 mg daily, Solu-Cortef 50 mg Q6hrs IV, Novolog 0-9 units TID, Novolog 0-5 units QHS, K Phos neutral 500 mg for two doses today, potassium chloride 20 mEq BID today, sennosides, cefepime, D5NS with KCl 40 mEq/L at 100 mL/hr, famotidine.  Labs reviewed: CBG 109-181, Potassium 2.4, Phosphorus 2.4.  Diet Order:   Diet Order            DIET - DYS 1 Room service appropriate? Yes; Fluid consistency: Honey Thick  Diet effective now             EDUCATION NEEDS:   Not appropriate for education at this time  Skin:  Skin Assessment: Skin Integrity Issues:(stg I left ankle)  Last BM:  08/22/2019 - medium type 6  Height:   Ht Readings  from Last 1 Encounters:  08/19/19 5\' 7"  (1.702 m)   Weight:   Wt Readings from Last 1 Encounters:  08/22/19 53.7 kg   Ideal Body Weight:  67.2 kg  BMI:  Body mass index is 18.54 kg/m.  Estimated Nutritional Needs:   Kcal:  1496kcal/day  Protein:  80-92g/day  Fluid:  1.6L/day  Willey Blade, MS, RD, LDN Office: [phone] Pager: [phone] After Hours/Weekend Pager: [phone]

## 2019-08-23 NOTE — Care Management Important Message (Signed)
Important Message  Patient Details  Name: Joseph Flores MRN: [id number] Date of Birth: 02-27-1961   Medicare Important Message Given:  Yes     Dannette Barbara 08/23/2019, 10:31 AM

## 2019-08-23 NOTE — Progress Notes (Signed)
Pharmacy Electrolyte Monitoring Consult:  Pharmacy consulted to assist in monitoring and replacing electrolytes in this 58 y.o. male admitted on 08/19/2019 with sepsis. Patient subsequently intubated upon admission to ICU. Patient's sedation regimen includes continuous fentanyl infusion. Patient with past medical history significant for Down's syndrome, arthritis, and degenerative disc. Patient taking prednisone 40mg  daily as an outpatient.   Labs:  Sodium (mmol/L)  Date Value  08/23/2019 140  12/06/2015 138  08/26/2014 142   Potassium (mmol/L)  Date Value  08/23/2019 2.4 (LL)  08/26/2014 3.4 (L)   Magnesium (mg/dL)  Date Value  08/23/2019 2.1   Phosphorus (mg/dL)  Date Value  08/23/2019 2.4 (L)   Calcium (mg/dL)  Date Value  08/23/2019 7.9 (L)   Calcium, Total (mg/dL)  Date Value  08/26/2014 8.0 (L)   Albumin (g/dL)  Date Value  08/22/2019 2.4 (L)  12/06/2015 3.4 (L)  02/04/2012 3.2 (L)  Corrected calcium: 9 mg/dl  10/11 - K 2.8, phos 1.9 Received Potassium phosphate 10 mmol IV x 1, and potassium 10 mEq x 4 IV.  MD started KCl 20 mEq PO BID on 10/11 and D5NS with KCl 40 mEq at 100 ml/hr on 10/12  Assessment/Plan: Potassium 2.4, Mag 2.1, and Phos 2.4 Will order KCl 10 mEq IV x4 runs and another KCl 40 mEq PO x1 K Phos Neutral  2 tab q4h x2 doses Recheck K at 1800  Will recheck all electrolytes with AM labs  Pharmacy will continue to monitor and adjust per consult.   Rayna Sexton, PharmD, BCPS Clinical Pharmacist 08/23/2019 10:11 AM

## 2019-08-23 NOTE — Progress Notes (Signed)
Pharmacy Electrolyte Monitoring Consult:  Pharmacy consulted to assist in monitoring and replacing electrolytes in this 58 y.o. male admitted on 08/19/2019 with sepsis. Patient subsequently intubated upon admission to ICU. Patient's sedation regimen includes continuous fentanyl infusion. Patient with past medical history significant for Down's syndrome, arthritis, and degenerative disc. Patient taking prednisone 40mg  daily as an outpatient.   Labs:  Sodium (mmol/L)  Date Value  08/23/2019 140  12/06/2015 138  08/26/2014 142   Potassium (mmol/L)  Date Value  08/23/2019 3.2 (L)  08/26/2014 3.4 (L)   Magnesium (mg/dL)  Date Value  08/23/2019 2.1   Phosphorus (mg/dL)  Date Value  08/23/2019 2.4 (L)   Calcium (mg/dL)  Date Value  08/23/2019 7.9 (L)   Calcium, Total (mg/dL)  Date Value  08/26/2014 8.0 (L)   Albumin (g/dL)  Date Value  08/22/2019 2.4 (L)  12/06/2015 3.4 (L)  02/04/2012 3.2 (L)  Corrected calcium: 9 mg/dl   Assessment/Plan: Potassium has increased to 3.4 with replacement. Potassium 20 mEq PO scheduled for tonight and patient remains on D5NS w/ 40 K at 100 ml/hr. Will follow up with morning labs.  Pharmacy will continue to monitor and adjust per consult.   Dorena Bodo, PharmD Clinical Pharmacist 08/23/2019 6:50 PM

## 2019-08-23 NOTE — TOC Initial Note (Signed)
Transition of Care [facility]) - Initial/Assessment Note    Patient Details  Name: Joseph Flores MRN: [id number] Date of Birth: 1961/02/05  Transition of Care [facility]) CM/SW Contact:    Consuelo Suthers, Lenice Llamas Phone Number: [phone]  08/23/2019, 10:19 AM  Clinical Narrative: Clinical Social Worker (CSW) received consult that patient was admitted to [facility] from a SNF. Per chart patient is from [facility]. CSW contacted [employer] at [facility] to get additional information. Per Neoma Laming patient is a long term care SNF resident under medicaid. Per Neoma Laming patient can return to [facility] when stable. CSW made Neoma Laming aware that patient had a negative covid test on 10/8. Per Neoma Laming patient does not require another negative covid test in order to return to [facility]. Neoma Laming reported that patient's sister Bethena Roys is his legal guardian. Neoma Laming emailed [place] guardianship appointment letter. CSW placed guardianship appointment letter on chart. CSW contacted patient's sister Bethena Roys who reported that she lives in [place] and was at [facility] yesterday. Sister reported that she will come to [facility] today around 11 am to feed patient. Sister confirmed that she is patient's guardian and she is agreeable for patient to return to [facility]. FL2 complete. CSW will continue to follow and assist as needed.                  Expected Discharge Plan: Skilled Nursing Facility Barriers to Discharge: Continued Medical Work up   Patient Goals and CMS Choice        Expected Discharge Plan and Services Expected Discharge Plan: [place] In-house Referral: Clinical Social Work     Living arrangements for the past 2 months: [place]                 DME Arranged: N/A         HH Arranged: NA          Prior Living Arrangements/Services Living arrangements for the past 2 months: [place] Lives with:: Facility Resident Patient language and need  for interpreter reviewed:: No Do you feel safe going back to the place where you live?: Yes      Need for Family Participation in Patient Care: Yes (Comment) Care giver support system in place?: Yes (comment)   Criminal Activity/Legal Involvement Pertinent to Current Situation/Hospitalization: No - Comment as needed  Activities of Daily Living Home Assistive Devices/Equipment: None ADL Screening (condition at time of admission) Patient's cognitive ability adequate to safely complete daily activities?: No Is the patient deaf or have difficulty hearing?: No Does the patient have difficulty seeing, even when wearing glasses/contacts?: Yes Does the patient have difficulty concentrating, remembering, or making decisions?: Yes Patient able to express need for assistance with ADLs?: No Does the patient have difficulty dressing or bathing?: Yes Independently performs ADLs?: No Communication: Dependent Is this a change from baseline?: Pre-admission baseline Dressing (OT): Dependent Is this a change from baseline?: Pre-admission baseline Grooming: Dependent Is this a change from baseline?: Pre-admission baseline Feeding: Dependent Is this a change from baseline?: Pre-admission baseline Bathing: Dependent Is this a change from baseline?: Pre-admission baseline Toileting: Dependent Is this a change from baseline?: Pre-admission baseline In/Out Bed: Dependent Is this a change from baseline?: Pre-admission baseline Walks in Home: Dependent Is this a change from baseline?: Pre-admission baseline Does the patient have difficulty walking or climbing stairs?: Yes Weakness of Legs: Both Weakness of Arms/Hands: Both  Permission Sought/Granted Permission sought to share information with :  Chartered certified accountant granted to share information with : Yes, Verbal Permission Granted              Emotional Assessment Appearance:: Appears stated age     Orientation: : Oriented  to Self, Fluctuating Orientation (Suspected and/or reported Sundowners) Alcohol / Substance Use: Not Applicable Psych Involvement: No (comment)  Admission diagnosis:  Community acquired pneumonia, unspecified laterality [J18.9] Sepsis, due to unspecified organism, unspecified whether acute organ dysfunction present [facility]) [A41.9] Patient Active Problem List   Diagnosis Date Noted  . Pressure injury of skin 08/20/2019  . Goals of care, counseling/discussion   . Palliative care by specialist   . Acute respiratory failure ([place]) 08/19/2019  . Sepsis ([place])   . Community acquired pneumonia   . Snoring 05/21/2017  . Excessive sleepiness 05/21/2017  . Fatigue 05/21/2017  . Eye abnormality 05/21/2017  . Annual physical exam 11/06/2016  . Erroneous encounter - disregard 10/07/2016  . Benign neoplasm of descending colon   . Diarrhea   . Hypotension 07/30/2016  . Elevated blood pressure reading without diagnosis of hypertension 07/18/2016  . Limping 01/08/2016  . Degenerative disc disease, lumbar 12/06/2015  . Down's syndrome 12/06/2015  . Elevated serum creatinine 12/06/2015  . Cardiac murmur 12/06/2015   PCP:  Dianne Dun, MD Pharmacy:   [id number] [place], [place] [address] [place] [address] Phone: [phone] Fax: [phone]     Social Determinants of Health (SDOH) Interventions    Readmission Risk Interventions No flowsheet data found.

## 2019-08-23 NOTE — NC FL2 (Signed)
[place] LEVEL OF CARE SCREENING TOOL     IDENTIFICATION  Patient Name: Joseph Flores Birthdate: December 19, 1960 Sex: male Admission Date (Current Location): 08/19/2019  [place] and [place] Number:  Selena Lesser [id number] [place] and Address:  [facility], [address] [place], [place] [address]      Provider Number: [id number]  Attending Physician Name and Address:  Vaughan Basta, *  Relative Name and Phone Number:       Current Level of Care: Hospital Recommended Level of Care: [place] Prior Approval Number:    Date Approved/Denied:   PASRR Number: [id number]  Discharge Plan: SNF    Current Diagnoses: Patient Active Problem List   Diagnosis Date Noted  . Pressure injury of skin 08/20/2019  . Goals of care, counseling/discussion   . Palliative care by specialist   . Acute respiratory failure ([place]) 08/19/2019  . Sepsis ([place])   . Community acquired pneumonia   . Snoring 05/21/2017  . Excessive sleepiness 05/21/2017  . Fatigue 05/21/2017  . Eye abnormality 05/21/2017  . Annual physical exam 11/06/2016  . Erroneous encounter - disregard 10/07/2016  . Benign neoplasm of descending colon   . Diarrhea   . Hypotension 07/30/2016  . Elevated blood pressure reading without diagnosis of hypertension 07/18/2016  . Limping 01/08/2016  . Degenerative disc disease, lumbar 12/06/2015  . Down's syndrome 12/06/2015  . Elevated serum creatinine 12/06/2015  . Cardiac murmur 12/06/2015    Orientation RESPIRATION BLADDER Height & Weight     Self  Normal Incontinent Weight: 118 lb 6.2 oz (53.7 kg) Height:  5\' 7"  (170.2 cm)  BEHAVIORAL SYMPTOMS/MOOD NEUROLOGICAL BOWEL NUTRITION STATUS      Continent Diet(Diet: Soft)  AMBULATORY STATUS COMMUNICATION OF NEEDS Skin   Extensive Assist Verbally PU Stage and Appropriate Care(Pressure ulcer stage 1 left ankle.)                       Personal Care Assistance  Level of Assistance  Bathing, Feeding, Dressing Bathing Assistance: Limited assistance Feeding assistance: Limited assistance Dressing Assistance: Limited assistance     Functional Limitations Info  Sight, Hearing, Speech Sight Info: Adequate Hearing Info: Adequate Speech Info: Adequate    SPECIAL CARE FACTORS FREQUENCY                       Contractures      Additional Factors Info  Code Status, Allergies Code Status Info: Full Code. Allergies Info: No Known Allergies.           Current Medications (08/23/2019):  This is the current hospital active medication list Current Facility-Administered Medications  Medication Dose Route Frequency Provider Last Rate Last Dose  . 0.9 %  sodium chloride infusion  250 mL Intravenous Continuous Flora Lipps, MD 10 mL/hr at 08/20/19 2254    . acetaminophen (TYLENOL) tablet 650 mg  650 mg Oral Q6H PRN Demetrios Loll, MD       Or  . acetaminophen (TYLENOL) suppository 650 mg  650 mg Rectal Q6H PRN Demetrios Loll, MD      . albuterol (PROVENTIL) (2.5 MG/3ML) 0.083% nebulizer solution 2.5 mg  2.5 mg Nebulization Q2H PRN Demetrios Loll, MD      . azithromycin [facility]) 500 mg in sodium chloride 0.9 % 250 mL IVPB  500 mg Intravenous Q24H Vaughan Basta, MD   Stopped at 08/22/19 1015  . bisacodyl (DULCOLAX) EC tablet 5 mg  5 mg Oral Daily PRN  Demetrios Loll, MD      . ceFEPIme (MAXIPIME) 2 g in sodium chloride 0.9 % 100 mL IVPB  2 g Intravenous Q8H Vaughan Basta, MD 200 mL/hr at 08/23/19 0836 2 g at 08/23/19 0836  . Chlorhexidine Gluconate Cloth 2 % PADS 6 each  6 each Topical Daily Flora Lipps, MD   6 each at 08/20/19 2211  . citalopram (CELEXA) tablet 10 mg  10 mg Per Tube Daily Flora Lipps, MD   10 mg at 08/23/19 1008  . dextrose 5 % and 0.9 % NaCl with KCl 40 mEq/L infusion   Intravenous Continuous Vaughan Basta, MD      . docusate (COLACE) 50 MG/5ML liquid 100 mg  100 mg Per Tube BID PRN Awilda Bill, NP      .  docusate (COLACE) 50 MG/5ML liquid 100 mg  100 mg Per Tube Daily Awilda Bill, NP   100 mg at 08/23/19 1013  . famotidine (PEPCID) IVPB 20 mg premix  20 mg Intravenous Q24H Awilda Bill, NP 100 mL/hr at 08/22/19 2142 20 mg at 08/22/19 2142  . Gerhardt's butt cream   Topical TID Vaughan Basta, MD      . heparin injection 5,000 Units  5,000 Units Subcutaneous Q8H Demetrios Loll, MD   5,000 Units at 08/23/19 0456  . hydrocortisone sodium succinate (SOLU-CORTEF) 100 MG injection 50 mg  50 mg Intravenous Q6H Flora Lipps, MD   50 mg at 08/23/19 0507  . insulin aspart (novoLOG) injection 0-5 Units  0-5 Units Subcutaneous QHS Mayo, Pete Pelt, MD      . insulin aspart (novoLOG) injection 0-9 Units  0-9 Units Subcutaneous TID WC Mayo, Pete Pelt, MD      . ondansetron [facility]) tablet 4 mg  4 mg Oral Q6H PRN Demetrios Loll, MD       Or  . ondansetron [facility]) injection 4 mg  4 mg Intravenous Q6H PRN Demetrios Loll, MD      . potassium chloride SA (KLOR-CON) CR tablet 20 mEq  20 mEq Oral BID Vaughan Basta, MD   20 mEq at 08/23/19 1008  . sennosides (SENOKOT) 8.8 MG/5ML syrup 5 mL  5 mL Per Tube BID Awilda Bill, NP   5 mL at 08/22/19 2146     Discharge Medications: Please see discharge summary for a list of discharge medications.  Relevant Imaging Results:  Relevant Lab Results:   Additional Information SSN: [id number]  Kaila Devries, Veronia Beets, LCSW

## 2019-08-23 NOTE — Plan of Care (Signed)
  Problem: Health Behavior/Discharge Planning: Goal: Ability to manage health-related needs will improve Outcome: Not Progressing   Problem: Education: Goal: Knowledge of General Education information will improve Description: Including pain rating scale, medication(s)/side effects and non-pharmacologic comfort measures Outcome: Not Progressing   Problem: Clinical Measurements: Goal: Ability to maintain clinical measurements within normal limits will improve Outcome: Not Progressing Goal: Will remain free from infection Outcome: Not Progressing Goal: Diagnostic test results will improve Outcome: Not Progressing Goal: Respiratory complications will improve Outcome: Not Progressing Goal: Cardiovascular complication will be avoided Outcome: Not Progressing   

## 2019-08-23 NOTE — Progress Notes (Signed)
[place] [facility] [place] NAME: Joseph Flores    MR#:  [id number]  DATE OF BIRTH:  May 02, 1961  SUBJECTIVE:  CHIEF COMPLAINT:   Chief Complaint  Patient presents with  . Shortness of Breath   Sent from nursing facility for suspected aspiration episode and hypoxia.  Intubated by ICU team on arrival. Extubated 08/21/19 Alert, nonverbal but making some shouting like noise because of his baseline Down syndrome. Patient is started on diet and he appears to be more drowsy today.  REVIEW OF SYSTEMS:  Due to baseline Down syndrome patient cannot give reliable review of system.  Does not appear in any discomfort.  ROS  DRUG ALLERGIES:  No Known Allergies  VITALS:  Blood pressure 109/67, pulse 69, temperature 98.2 F (36.8 C), resp. rate 16, height 5\' 7"  (1.702 m), weight 53.7 kg, SpO2 95 %.  PHYSICAL EXAMINATION:  GENERAL:  58 y.o.-year-old patient lying in the bed , resting comfortably.Marland Kitchen  EYES: Pupils equal, round, reactive to light . No scleral icterus. Extraocular muscles intact.  HEENT: Head atraumatic, normocephalic. Oropharynx and nasopharynx clear.  NECK:  Supple, no jugular venous distention. No thyroid enlargement, no tenderness.  LUNGS: Normal breath sounds bilaterally, no wheezing, rales,rhonchi or crepitation. No use of accessory muscles of respiration.   CARDIOVASCULAR: S1, S2 normal. No murmurs, rubs, or gallops.  ABDOMEN: Soft, nontender, nondistended. Bowel sounds present. No organomegaly or mass.  EXTREMITIES: No pedal edema, cyanosis, or clubbing.  NEUROLOGIC: He is looking at me but noncommunicative due to his baseline Down syndrome.  He does not follow commands and he moves his limbs a little spontaneously. Have atrophic lower limbs. PSYCHIATRIC: The patient is alert and oriented x 0.  SKIN: No obvious rash, lesion, or ulcer.   Physical Exam LABORATORY PANEL:   CBC Recent Labs  Lab 08/22/19 0629  WBC 15.7*  HGB 8.3*   HCT 26.4*  PLT 185   ------------------------------------------------------------------------------------------------------------------  Chemistries  Recent Labs  Lab 08/20/19 0338  08/23/19 0745  NA 149*   < > 140  K 4.5   < > 2.4*  CL 110   < > 99  CO2 27   < > 30  GLUCOSE 175*   < > 113*  BUN 30*   < > 15  CREATININE 1.13   < > 0.68  CALCIUM 7.9*   < > 7.9*  MG 1.9   < > 2.1  AST 38  --   --   ALT 62*  --   --   ALKPHOS 40  --   --   BILITOT 0.7  --   --    < > = values in this interval not displayed.   ------------------------------------------------------------------------------------------------------------------  Cardiac Enzymes No results for input(s): TROPONINI in the last 168 hours. ------------------------------------------------------------------------------------------------------------------  RADIOLOGY:  No results found.  ASSESSMENT AND PLAN:   Active Problems:   Acute respiratory failure (HCC)   Sepsis ([place])   Community acquired pneumonia   Pressure injury of skin   Goals of care, counseling/discussion   Palliative care by specialist   * Acute respiratory failure with hypoxia due to sepsis and aspiration pneumonia. Status post intubation, continue ventilatory support and manage per ICU team. Broad-spectrum antibiotics. Change to azithromycin and cefepime per ICU. Extubated per ICU- 08/21/19 Stable on room air now.  * Hypotension due to sepsis.  Continue IV fluid support, normal saline bolus and Levophed . Improved and stable now.  * Lactic acidosis due to  sepsis.  Treatment as above and follow-up lactic acid level.  * Hypernatremia.  Half-normal saline IV and follow-up BMP.  * Acute renal failure on CKD, IV fluid support and follow-up BMP. Improved.  * Abnormal liver function tests due to sepsis, follow-up CMP.  *Aspiration-    Swallow evaluation is done with suggested dysphagia 1 diet.  *Hypokalemia Replace IV and oral and follow  magnesium.  All the records are reviewed and case discussed with Care Management/Social Workerr. Management plans discussed with the patient, family and they are in agreement.  CODE STATUS: Full code  TOTAL TIME TAKING CARE OF THIS PATIENT: 35 minutes.    POSSIBLE D/C IN 1-2 DAYS, DEPENDING ON CLINICAL CONDITION.   Vaughan Basta M.D on 08/23/2019   Between 7am to 6pm - Pager - [phone]  After 6pm go to [email or web address] - password [email or web address] [place] [facility]  Office  [phone]  CC: Primary care physician; Dianne Dun, MD  Note: This dictation was prepared with Dragon dictation along with smaller phrase technology. Any transcriptional errors that result from this process are unintentional.
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
## 2019-08-24 LAB — BASIC METABOLIC PANEL
Anion gap: 9 (ref 5–15)
BUN: 18 mg/dL (ref 6–20)
CO2: 30 mmol/L (ref 22–32)
Calcium: 7.4 mg/dL — ABNORMAL LOW (ref 8.9–10.3)
Chloride: 106 mmol/L (ref 98–111)
Creatinine, Ser: 0.71 mg/dL (ref 0.61–1.24)
GFR calc Af Amer: >60 mL/min (ref >60)
GFR calc non Af Amer: >60 mL/min (ref >60)
Glucose, Bld: 120 mg/dL — ABNORMAL HIGH (ref 70–99)
Potassium: 3.2 mmol/L — ABNORMAL LOW (ref 3.5–5.1)
Sodium: 145 mmol/L (ref 135–145)

## 2019-08-24 LAB — CULTURE, BLOOD (ROUTINE X 2)
Culture: NO GROWTH
Culture: NO GROWTH
Special Requests: ADEQUATE

## 2019-08-24 LAB — GLUCOSE, CAPILLARY
Glucose-Capillary: 104 mg/dL — ABNORMAL HIGH (ref 70–99)
Glucose-Capillary: 121 mg/dL — ABNORMAL HIGH (ref 70–99)

## 2019-08-24 LAB — MAGNESIUM: Magnesium: 2.1 mg/dL (ref 1.7–2.4)

## 2019-08-24 LAB — SARS CORONAVIRUS 2 (TAT 6-24 HRS): SARS Coronavirus 2: NEGATIVE

## 2019-08-24 LAB — PHOSPHORUS: Phosphorus: 2.3 mg/dL — ABNORMAL LOW (ref 2.5–4.6)

## 2019-08-24 LAB — MYCOPLASMA PNEUMONIAE ANTIBODY, IGM: Mycoplasma pneumo IgM: <770 U/mL (ref 0–769)

## 2019-08-24 MED ORDER — PREDNISONE 10 MG PO TABS: 10.0000 mg | ORAL_TABLET | Freq: Every day | ORAL | 0 refills | Status: AC

## 2019-08-24 MED ORDER — POTASSIUM CHLORIDE CRYS ER 20 MEQ PO TBCR
40.0000 meq | EXTENDED_RELEASE_TABLET | Freq: Once | ORAL | Status: AC
  Administered 2019-08-24: 40 meq via ORAL
  Filled 2019-08-24: qty 2

## 2019-08-24 MED ORDER — BISACODYL 5 MG PO TBEC: 5.0000 mg | DELAYED_RELEASE_TABLET | Freq: Every day | ORAL | 0 refills | Status: DC | PRN

## 2019-08-24 MED ORDER — PREDNISONE 20 MG PO TABS: 20.0000 mg | ORAL_TABLET | Freq: Every day | ORAL | 0 refills | Status: AC

## 2019-08-24 MED ORDER — FAMOTIDINE 20 MG PO TABS: 20.0000 mg | ORAL_TABLET | Freq: Every day | ORAL | Status: DC

## 2019-08-24 MED ORDER — K PHOS MONO-SOD PHOS DI & MONO 155-852-130 MG PO TABS
500.0000 mg | ORAL_TABLET | ORAL | Status: DC
  Administered 2019-08-24 (×2): 500 mg via ORAL
  Filled 2019-08-24 (×3): qty 2

## 2019-08-24 MED ORDER — PREDNISONE 10 MG PO TABS: 30.0000 mg | ORAL_TABLET | Freq: Every day | ORAL | 0 refills | Status: AC

## 2019-08-24 NOTE — Progress Notes (Signed)
Speech Language Pathology Treatment: Dysphagia  Patient Details Name: Joseph Flores MRN: [id number] DOB: 1961/02/21 Today's Date: 08/24/2019 Time: 0920-1004 SLP Time Calculation (min) (ACUTE ONLY): 44 min  Assessment / Plan / Recommendation Clinical Impression  Pt seen for ongoing assessment of toleration of diet; trials to upgrade diet to Nectar consistency liquids. NSG reported pt is tolerating the current dysphagia diet well w/ no overt s/s of aspiration noted. Pt required min more assist to position upright, midline to continue the meal. NSG aide reported good toleration of breakfast meal so far. Pt has baseline Down Syndrome and requires support w/ feeding and follow through w/ tasks.  Pt appears to present w/ mild oral phase dysphagia (suspect impacted by baseline Cognitive status) and potential pharyngeal phase dysphagia w/ increased risk for choking/aspiration especially if drinking/eating fast, impulsively. Pt demonstrated good toleration of the trials of purees, Honey consistency liquids via cup/straw. Noted min increased oral phase time w/ the puree trials/foods to fully manipulate and orally clear the boluses. TIME was given as well as alternating foods/liquids. Trials of solid foods were not assessed at this session d/t baseline Cognitive status and missing Dentition -- an increased risk for choking. Pt was then given trials of Nectar consistency liquids via spoon/straw w/ NO overt s/s of aspiration noted; no decline in vocal quality or respiratory status during/post trials. Pt continued to require Mod-Max. Verbal/visual/tactile cues for follow through w/ tasks; Pinching of straw to limit bolus amount/size d/t impulsive, consecutive drinking. Again, suspect pt's presentation is d/t impact from Cognitive status.  Due to pt's baseline Cognitive status and min increased risk for choking/aspiration w/ oral intake if not FULLY monitored at this time, recommend full feeding Supervision and  support d/t weakness and Cognitive status(inattention to task); a dysphagia level 1 (Puree) diet w/ Nectar consistency liquids -- monitor for any impulsive drinking behaviors; aspiration precautions; Pills Crushed in Puree for safer swallowing; ongoing ST services to f/u w/ pt's toleration of diet, trials to upgrade as indicated/appropriate w/ objective assessment if indicated. MD/NSG/CM updated on tx session and recommendations.    HPI HPI: Pt is a 58 y.o. male with a known history of arthritis, Down syndrome and degeneration of lumbar disc.  The patient is sent from group home to ED due to above chief complaints.  The patient was suspected aspirated in the facility and developed shortness of breath.  He has hypoxia with O2 saturation at 94% on 10 L nonrebreather oxygen.  He has never 102.7, hypotension, tachycardia and tachypnea.  Lactic acid is 7.2.  Chest x-ray show bilateral pneumonia.  COVID-19 test is negative.  Sepsis protocol is started and antibiotics was given.  Pt has been tolerating the modified diet well since evaluation per NSG.       SLP Plan  Continue with current plan of care       Recommendations  Diet recommendations: Dysphagia 1 (puree);Nectar-thick liquid Liquids provided via: Cup;Straw(monitor for impulsive drinking behavior) Medication Administration: Crushed with puree(for safer swallowing) Supervision: Staff to assist with self feeding;Full supervision/cueing for compensatory strategies Compensations: Minimize environmental distractions;Slow rate;Small sips/bites;Lingual sweep for clearance of pocketing;Multiple dry swallows after each bite/sip;Follow solids with liquid Postural Changes and/or Swallow Maneuvers: Seated upright 90 degrees;Upright 30-60 min after meal                General recommendations: (Dietician f/u) Oral Care Recommendations: Oral care BID;Staff/trained caregiver to provide oral care Follow up Recommendations: Skilled Nursing facility SLP  Visit Diagnosis: Dysphagia, oropharyngeal phase (R13.12)  Plan: Continue with current plan of care       [place], [place], CCC-SLP Joseph Flores 08/24/2019, 10:10 AM

## 2019-08-24 NOTE — TOC Progression Note (Signed)
Transition of Care [facility]) - Progression Note    Patient Details  Name: Joseph Flores MRN: [id number] Date of Birth: 1960/12/03  Transition of [facility] CM/SW Contact  Su Hilt, RN Phone Number: 08/24/2019, 8:56 AM  Clinical Narrative:    RNCM will continue to monitor for needs, Plan to DC back to [facility] L[facility]   Expected Discharge Plan: [place] Barriers to Discharge: Continued Medical Work up  Expected Discharge Plan and Services Expected Discharge Plan: [place] In-house Referral: Clinical Social Work     Living arrangements for the past 2 months: [place]                 DME Arranged: N/A         HH Arranged: NA           Social Determinants of Health (SDOH) Interventions    Readmission Risk Interventions No flowsheet data found.

## 2019-08-24 NOTE — Progress Notes (Signed)
PHARMACIST - PHYSICIAN COMMUNICATION  CONCERNING: IV to Oral Route Change Policy  RECOMMENDATION: This patient is receiving famotidine by the intravenous route.  Based on criteria approved by the Pharmacy and Therapeutics Committee, the intravenous medication(s) is/are being converted to the equivalent oral dose form(s).   DESCRIPTION: These criteria include:  The patient is eating (either orally or via tube) and/or has been taking other orally administered medications for a least 24 hours  The patient has no evidence of active gastrointestinal bleeding or impaired GI absorption (gastrectomy, short bowel, patient on TNA or NPO).  If you have questions about this conversion, please contact the Pharmacy Department  []   [phone] )  [facility] [x]   [phone] )  [facility] []   [phone] )  [facility] []   [phone] )  [facility] []   [phone] )  [place], [facility] 08/24/2019 10:09 AM

## 2019-08-24 NOTE — TOC Transition Note (Addendum)
Transition of Care [facility]) - CM/SW Discharge Note   Patient Details  Name: ZAKIAH BECKERMAN MRN: [id number] Date of Birth: [birth year]  Transition of Care [facility]) CM/SW Contact:  Ramonica Grigg, Lenice Llamas Phone Number: [phone]  08/24/2019, 2:11 PM   Clinical Narrative: Per Neoma Laming admissions coordinator at [facility] patient can return today to room 207-B. RN will call report to [facility] and arrange EMS for transport. Clinical Social Worker (CSW) contacted patient's sister/ guardian Bethena Roys and made her aware of above. CSW sent D/C orders to [facility] via [place]. Please reconsult if future social work needs arise. CSW signing off.   3 pm: Clinical Education officer, museum (CSW) received a call from patient's sister Bethena Roys. Bethena Roys requested that RN call her when EMS arrives to transport. RN aware of above.     Final next level of care: Long Term Nursing Home Barriers to Discharge: Barriers Resolved   Patient Goals and CMS Choice        Discharge Placement                       Discharge Plan and Services In-house Referral: Clinical Social Work              DME Arranged: N/A         HH Arranged: NA          Social Determinants of Health (SDOH) Interventions     Readmission Risk Interventions No flowsheet data found.

## 2019-08-24 NOTE — Discharge Summary (Signed)
[place] [place] NAME: Joseph Flores    MR#:  [id number]  DATE OF BIRTH:  [birth year]  DATE OF ADMISSION:  08/19/2019 ADMITTING PHYSICIAN: Demetrios Loll, MD  DATE OF DISCHARGE: 08/24/2019   PRIMARY CARE PHYSICIAN: Dianne Dun, MD    ADMISSION DIAGNOSIS:  Community acquired pneumonia, unspecified laterality [J18.9] Sepsis, due to unspecified organism, unspecified whether acute organ dysfunction present ([place]) [A41.9]  DISCHARGE DIAGNOSIS:  Active Problems:   Acute respiratory failure ([place])   Sepsis ([place])   Community acquired pneumonia   Pressure injury of skin   Goals of care, counseling/discussion   Palliative care by specialist   SECONDARY DIAGNOSIS:   Past Medical History:  Diagnosis Date  . Arthritis   . Degeneration of intervertebral disc of lumbar region   . Down's syndrome     HOSPITAL COURSE:   * Acute respiratory failure with hypoxia due to sepsis and aspiration pneumonia. Status post intubation, continue ventilatory support and manage per ICU team. Broad-spectrum antibiotics. Change to azithromycin and cefepime per ICU. Extubated per ICU- 08/21/19 Stable on room air now. Patient received Toradol 6 days of IV antibiotic therapy and he is completely stable now so I will stop and not give any further antibiotics on discharge.  * Hypotension due to sepsis. Continue IV fluid support, normal saline bolus and Levophed . Improved and stable now.  * Lactic acidosisdue to sepsis. Treatment as above and follow-up lactic acid level.  * Hypernatremia. Half-normal saline IV and follow-up BMP.  * Acute renal failure on CKD, IV fluid support and follow-up BMP. Improved.  * Abnormal liver function tests due to sepsis, follow-up CMP.  *Aspiration-    Swallow evaluation is done with suggested dysphagia 1 diet.  *Hypokalemia Replace IV and oral and follow magnesium.  DISCHARGE CONDITIONS:    *Stable  CONSULTS OBTAINED:   Intensivist and swallow evaluation.  DRUG ALLERGIES:  No Known Allergies  DISCHARGE MEDICATIONS:   Allergies as of 08/24/2019   No Known Allergies     Medication List    STOP taking these medications   traMADol 50 MG tablet Commonly known as: ULTRAM     TAKE these medications   bisacodyl 5 MG EC tablet Commonly known as: DULCOLAX Take 1 tablet (5 mg total) by mouth daily as needed for moderate constipation.   citalopram 10 MG tablet Commonly known as: CELEXA Take 10 mg by mouth daily.   predniSONE 10 MG tablet Commonly known as: DELTASONE Take 3 tablets (30 mg total) by mouth daily with breakfast for 1 day. Start taking on: August 25, 2019 What changed:   medication strength  how much to take  when to take this   predniSONE 20 MG tablet Commonly known as: DELTASONE Take 1 tablet (20 mg total) by mouth daily with breakfast for 1 day. Start taking on: August 26, 2019 What changed: You were already taking a medication with the same name, and this prescription was added. Make sure you understand how and when to take each.   predniSONE 10 MG tablet Commonly known as: DELTASONE Take 1 tablet (10 mg total) by mouth daily with breakfast for 1 day. Start taking on: August 27, 2019 What changed: You were already taking a medication with the same name, and this prescription was added. Make sure you understand how and when to take each.   tamsulosin 0.4 MG Caps capsule Commonly known as: FLOMAX Take 0.4 mg by mouth daily.   Vitamin  D (Ergocalciferol) 1.25 MG (50000 UT) Caps capsule Commonly known as: DRISDOL Take 1 capsule (50,000 Units total) by mouth once a week. For 12 weeks        DISCHARGE INSTRUCTIONS:   Diet recommended :  Dysphagia level 1 (PUREE) w/ NECTAR consistency liquids; FULL Supervision and Assistance at all meals/oral intake; Pills in Puree - Crushed for safer swallowing; aspiration precautions   If you  experience worsening of your admission symptoms, develop shortness of breath, life threatening emergency, suicidal or homicidal thoughts you must seek medical attention immediately by calling 911 or calling your MD immediately  if symptoms less severe.  You Must read complete instructions/literature along with all the possible adverse reactions/side effects for all the Medicines you take and that have been prescribed to you. Take any new Medicines after you have completely understood and accept all the possible adverse reactions/side effects.   Please note  You were cared for by a hospitalist during your hospital stay. If you have any questions about your discharge medications or the care you received while you were in the hospital after you are discharged, you can call the unit and asked to speak with the hospitalist on call if the hospitalist that took care of you is not available. Once you are discharged, your primary care physician will handle any further medical issues. Please note that NO REFILLS for any discharge medications will be authorized once you are discharged, as it is imperative that you return to your primary care physician (or establish a relationship with a primary care physician if you do not have one) for your aftercare needs so that they can reassess your need for medications and monitor your lab values.    Today   CHIEF COMPLAINT:   Chief Complaint  Patient presents with  . Shortness of Breath    HISTORY OF PRESENT ILLNESS:  Joseph Flores  is a 58 y.o. male with a known history arthritis, Down syndrome and degeneration of lumbar disc.  The patient is sent from group home to ED due to above chief complaints.  The patient was suspected aspirated in the facility and developed shortness of breath.  He has hypoxia with O2 saturation at 94% on 10 L nonrebreather oxygen.  He has never 102.7, hypotension, tachycardia and tachypnea.  Lactic acid is 7.2.  Chest x-ray show bilateral  pneumonia.  COVID-19 test is negative.  Sepsis protocol is started and antibiotics was given.  Dr. Rip Harbour request admission.   VITAL SIGNS:  Blood pressure (!) 169/77, pulse 66, temperature (!) 97.4 F (36.3 C), temperature source Oral, resp. rate 18, height _0  (1.702 m), weight 53.7 kg, SpO2 97 %.  I/O:    Intake/Output Summary (Last 24 hours) at 08/24/2019 1240 Last data filed at 08/24/2019 1122 Gross per 24 hour  Intake 2504.04 ml  Output 1950 ml  Net 554.04 ml    PHYSICAL EXAMINATION:  GENERAL:  58 y.o.-year-old patient lying in the bed , resting comfortably.Marland Kitchen  EYES: Pupils equal, round, reactive to light . No scleral icterus. Extraocular muscles intact.  HEENT: Head atraumatic, normocephalic. Oropharynx and nasopharynx clear.  NECK:  Supple, no jugular venous distention. No thyroid enlargement, no tenderness.  LUNGS: Normal breath sounds bilaterally, no wheezing, rales,rhonchi or crepitation. No use of accessory muscles of respiration.   CARDIOVASCULAR: S1, S2 normal. No murmurs, rubs, or gallops.  ABDOMEN: Soft, nontender, nondistended. Bowel sounds present. No organomegaly or mass.  EXTREMITIES: No pedal edema, cyanosis, or clubbing.  NEUROLOGIC: He  is looking at me but noncommunicative due to his baseline Down syndrome.  He does not follow commands and he moves his limbs a little spontaneously. Have atrophic lower limbs. PSYCHIATRIC: The patient is alert and oriented x 0.  SKIN: No obvious rash, lesion, or ulcer.   DATA REVIEW:   CBC Recent Labs  Lab 08/22/19 0629  WBC 15.7*  HGB 8.3*  HCT 26.4*  PLT 185    Chemistries  Recent Labs  Lab 08/20/19 0338  08/24/19 0417  NA 149*   < > 145  K 4.5   < > 3.2*  CL 110   < > 106  CO2 27   < > 30  GLUCOSE 175*   < > 120*  BUN 30*   < > 18  CREATININE 1.13   < > 0.71  CALCIUM 7.9*   < > 7.4*  MG 1.9   < > 2.1  AST 38  --   --   ALT 62*  --   --   ALKPHOS 40  --   --   BILITOT 0.7  --   --    < > = values  in this interval not displayed.    Cardiac Enzymes No results for input(s): TROPONINI in the last 168 hours.  Microbiology Results  Results for orders placed or performed during the hospital encounter of 08/19/19  SARS Coronavirus 2 [facility] order, Performed in [facility] hospital lab) Nasopharyngeal Nasopharyngeal Swab     Status: None   Collection Time: 08/19/19 10:56 AM   Specimen: Nasopharyngeal Swab  Result Value Ref Range Status   SARS Coronavirus 2 NEGATIVE NEGATIVE Final    Comment: (NOTE) If result is NEGATIVE SARS-CoV-2 target nucleic acids are NOT DETECTED. The SARS-CoV-2 RNA is generally detectable in upper and lower  respiratory specimens during the acute phase of infection. The lowest  concentration of SARS-CoV-2 viral copies this assay can detect is 250  copies / mL. A negative result does not preclude SARS-CoV-2 infection  and should not be used as the sole basis for treatment or other  patient management decisions.  A negative result may occur with  improper specimen collection / handling, submission of specimen other  than nasopharyngeal swab, presence of viral mutation(s) within the  areas targeted by this assay, and inadequate number of viral copies  (<250 copies / mL). A negative result must be combined with clinical  observations, patient history, and epidemiological information. If result is POSITIVE SARS-CoV-2 target nucleic acids are DETECTED. The SARS-CoV-2 RNA is generally detectable in upper and lower  respiratory specimens dur ing the acute phase of infection.  Positive  results are indicative of active infection with SARS-CoV-2.  Clinical  correlation with patient history and other diagnostic information is  necessary to determine patient infection status.  Positive results do  not rule out bacterial infection or co-infection with other viruses. If result is PRESUMPTIVE POSTIVE SARS-CoV-2 nucleic acids MAY BE PRESENT.   A presumptive positive  result was obtained on the submitted specimen  and confirmed on repeat testing.  While 2019 novel coronavirus  (SARS-CoV-2) nucleic acids may be present in the submitted sample  additional confirmatory testing may be necessary for epidemiological  and / or clinical management purposes  to differentiate between  SARS-CoV-2 and other Sarbecovirus currently known to infect humans.  If clinically indicated additional testing with an alternate test  methodology [phone]) is advised. The SARS-CoV-2 RNA is generally  detectable in upper and lower respiratory  sp ecimens during the acute  phase of infection. The expected result is Negative. Fact Sheet for Patients:  StrictlyIdeas.no Fact Sheet for Healthcare Providers: BankingDealers.co.za This test is not yet approved or cleared by the [place] FDA and has been authorized for detection and/or diagnosis of SARS-CoV-2 by FDA under an Emergency Use Authorization (EUA).  This EUA will remain in effect (meaning this test can be used) for the duration of the COVID-19 declaration under Section 564(b)(1) of the Act, 21 U.S.C. section 360bbb-3(b)(1), unless the authorization is terminated or revoked sooner. Performed at [facility], [address] [place], [place] [address]   Blood Culture (routine x 2)     Status: None   Collection Time: 08/19/19 11:05 AM   Specimen: BLOOD  Result Value Ref Range Status   Specimen Description BLOOD BLOOD RIGHT HAND  Final   Special Requests   Final    BOTTLES DRAWN AEROBIC AND ANAEROBIC Blood Culture results may not be optimal due to an inadequate volume of blood received in culture bottles   Culture   Final    NO GROWTH 5 DAYS Performed at [facility], [address] [place], [place] [address]    Report Status 08/24/2019 FINAL  Final  Blood Culture (routine x 2)     Status: None   Collection Time: 08/19/19 11:14 AM   Specimen: BLOOD   Result Value Ref Range Status   Specimen Description BLOOD BLOOD LEFT HAND  Final   Special Requests   Final    BOTTLES DRAWN AEROBIC AND ANAEROBIC Blood Culture adequate volume   Culture   Final    NO GROWTH 5 DAYS Performed at [facility], [address] [place], [place] [address]    Report Status 08/24/2019 FINAL  Final  MRSA PCR Screening     Status: None   Collection Time: 08/19/19  3:02 PM   Specimen: Nasopharyngeal  Result Value Ref Range Status   MRSA by PCR NEGATIVE NEGATIVE Final    Comment:        The GeneXpert MRSA Assay (FDA approved for NASAL specimens only), is one component of a comprehensive MRSA colonization surveillance program. It is not intended to diagnose MRSA infection nor to guide or monitor treatment for MRSA infections. Performed at [facility], [address] [place], [place] [address]   Urine culture     Status: None   Collection Time: 08/19/19  3:52 PM   Specimen: In/Out Cath Urine  Result Value Ref Range Status   Specimen Description   Final    IN/OUT CATH URINE Performed at [facility], [address], [place], [place] [address]    Special Requests   Final    NONE Performed at [facility], [address], [place], [place] [address]    Culture   Final    NO GROWTH Performed at [facility], [place] [address] [place], [place] [address]    Report Status 08/20/2019 FINAL  Final  Culture, bal-quantitative     Status: Abnormal   Collection Time: 08/19/19  5:06 PM   Specimen: Bronchoalveolar Lavage; Respiratory  Result Value Ref Range Status   Specimen Description   Final    BRONCHIAL ALVEOLAR LAVAGE Performed at [facility], [address], [place], [place] [address]    Special Requests   Final    NONE Performed at [facility], [address] [place], [place] [address]    Gram Stain  Final    FEW NO ORGANISMS SEEN RARE GRAM POSITIVE COCCI IN PAIRS    Culture  (A)  Final    >=100,000 COLONIES/mL Consistent with normal respiratory flora. Performed at [facility], [place] [address], [place], [place] [address]    Report Status 08/21/2019 FINAL  Final  SARS CORONAVIRUS 2 (TAT 6-24 HRS) Nasopharyngeal Nasopharyngeal Swab     Status: None   Collection Time: 08/23/19 10:43 PM   Specimen: Nasopharyngeal Swab  Result Value Ref Range Status   SARS Coronavirus 2 NEGATIVE NEGATIVE Final    Comment: (NOTE) SARS-CoV-2 target nucleic acids are NOT DETECTED. The SARS-CoV-2 RNA is generally detectable in upper and lower respiratory specimens during the acute phase of infection. Negative results do not preclude SARS-CoV-2 infection, do not rule out co-infections with other pathogens, and should not be used as the sole basis for treatment or other patient management decisions. Negative results must be combined with clinical observations, patient history, and epidemiological information. The expected result is Negative. Fact Sheet for Patients: SugarRoll.be Fact Sheet for Healthcare Providers: https://www.woods-mathews.com/ This test is not yet approved or cleared by the [place] FDA and  has been authorized for detection and/or diagnosis of SARS-CoV-2 by FDA under an Emergency Use Authorization (EUA). This EUA will remain  in effect (meaning this test can be used) for the duration of the COVID-19 declaration under Section 56 4(b)(1) of the Act, 21 U.S.C. section 360bbb-3(b)(1), unless the authorization is terminated or revoked sooner. Performed at [facility], [place] [address] [place], [place] [address]     RADIOLOGY:  No results found.  EKG:   Orders placed or performed during the hospital encounter of 08/19/19  . ED EKG 12-Lead  . ED EKG 12-Lead  . EKG 12-Lead  . EKG 12-Lead      Management plans discussed with the patient, family and they are in agreement.  CODE STATUS: Full  code    Code Status Orders  (From admission, onward)         Start     Ordered   08/19/19 1337  Full code  Continuous     08/19/19 1336        Code Status History    This patient has a current code status but no historical code status.   Advance Care Planning Activity      TOTAL TIME TAKING CARE OF THIS PATIENT: 35 minutes.  Discussed with patient's sister on phone.  Vaughan Basta M.D on 08/24/2019 at 12:40 PM  Between 7am to 6pm - Pager - [phone]  After 6pm go to [email or web address] - password [email or web address] [place] [facility]  Office  [phone]  CC: Primary care physician; Dianne Dun, MD   Note: This dictation was prepared with Dragon dictation along with smaller phrase technology. Any transcriptional errors that result from this process are unintentional.

## 2019-08-24 NOTE — Progress Notes (Signed)
Report called and given to [place] at [facility]. Transport called. IVs removed. Patient in stable condition.

## 2019-08-24 NOTE — TOC Transition Note (Signed)
Transition of Care [facility]) - CM/SW Discharge Note   Patient Details  Name: Joseph Flores MRN: [id number] Date of Birth: [birth year]  [facility] CM/SW Contact:  Su Hilt, RN Phone Number: 08/24/2019, 12:59 PM   Clinical Narrative:    The patient will DC back to the [facility] today via ems, transport, his sister is aware The bed side nurse to call report to [phone] and then call ems for transport, no additional needs   Final next level of care: Long Term Nursing Home Barriers to Discharge: Barriers Resolved   Patient Goals and CMS Choice        Discharge Placement                       Discharge Plan and Services In-house Referral: Clinical Social Work              DME Arranged: N/A         HH Arranged: NA          Social Determinants of Health (SDOH) Interventions     Readmission Risk Interventions No flowsheet data found.

## 2019-08-24 NOTE — Progress Notes (Signed)
Pharmacy Electrolyte Monitoring Consult:  Pharmacy consulted to assist in monitoring and replacing electrolytes in this 58 y.o. male admitted on 08/19/2019 with sepsis. Patient subsequently intubated upon admission to ICU. Patient's sedation regimen includes continuous fentanyl infusion. Patient with past medical history significant for Down's syndrome, arthritis, and degenerative disc.   Labs:  Sodium (mmol/L)  Date Value  08/24/2019 145  12/06/2015 138  08/26/2014 142   Potassium (mmol/L)  Date Value  08/24/2019 3.2 (L)  08/26/2014 3.4 (L)   Magnesium (mg/dL)  Date Value  08/24/2019 2.1   Phosphorus (mg/dL)  Date Value  08/24/2019 2.3 (L)   Calcium (mg/dL)  Date Value  08/24/2019 7.4 (L)   Calcium, Total (mg/dL)  Date Value  08/26/2014 8.0 (L)   Albumin (g/dL)  Date Value  08/22/2019 2.4 (L)  12/06/2015 3.4 (L)  02/04/2012 3.2 (L)     Assessment/Plan: K 3.2, Mag 2.1, Phos 2.3 Potassium remains 3.2 despite 20 mEq PO last night plus ongoing fluids with KCl.  Standing orders for Potassium 20 mEq PO BID and D5NS w/ 40 K at 100 ml/hr.  Will order another KCl 40 mEq PO x1 K Phos Neutral  2 tab q4h x 3 doses Will recheck all electrolytes with AM labs   Pharmacy will continue to monitor and adjust per consult.   Rayna Sexton, PharmD, BCPS Clinical Pharmacist 08/24/2019 9:59 AM

## 2019-08-25 LAB — CHLAMYDIA PANEL SERUM
C. Pneumoniae IgM Serum: <1:10 {titer}
C. Psittaci IgM Serum: <1:10 {titer}
C. Trachomatis IgM Serum: <1:10 {titer}

## 2019-09-14 ENCOUNTER — Other Ambulatory Visit: Payer: Self-pay | Admitting: Gastroenterology

## 2019-09-14 DIAGNOSIS — R1314 Dysphagia, pharyngoesophageal phase: Secondary | ICD-10-CM

## 2019-09-16 ENCOUNTER — Other Ambulatory Visit: Payer: Self-pay

## 2019-09-16 ENCOUNTER — Emergency Department: Payer: Medicare Other

## 2019-09-16 ENCOUNTER — Inpatient Hospital Stay
Admission: EM | Admit: 2019-09-16 | Discharge: 2019-09-29 | DRG: 871 | Disposition: A | Discharge status: Skilled Nursing Facility | Payer: Medicare Other | Source: Skilled Nursing Facility | Attending: Family Medicine | Admitting: Family Medicine

## 2019-09-16 DIAGNOSIS — N400 Enlarged prostate without lower urinary tract symptoms: Secondary | ICD-10-CM | POA: Diagnosis present

## 2019-09-16 DIAGNOSIS — L8990 Pressure ulcer of unspecified site, unspecified stage: Secondary | ICD-10-CM | POA: Diagnosis present

## 2019-09-16 DIAGNOSIS — R0902 Hypoxemia: Secondary | ICD-10-CM

## 2019-09-16 DIAGNOSIS — Z809 Family history of malignant neoplasm, unspecified: Secondary | ICD-10-CM

## 2019-09-16 DIAGNOSIS — Z7189 Other specified counseling: Secondary | ICD-10-CM | POA: Diagnosis not present

## 2019-09-16 DIAGNOSIS — Z86718 Personal history of other venous thrombosis and embolism: Secondary | ICD-10-CM

## 2019-09-16 DIAGNOSIS — N183 Chronic kidney disease, stage 3 unspecified: Secondary | ICD-10-CM | POA: Diagnosis present

## 2019-09-16 DIAGNOSIS — Z7401 Bed confinement status: Secondary | ICD-10-CM

## 2019-09-16 DIAGNOSIS — J690 Pneumonitis due to inhalation of food and vomit: Secondary | ICD-10-CM | POA: Diagnosis present

## 2019-09-16 DIAGNOSIS — R1312 Dysphagia, oropharyngeal phase: Secondary | ICD-10-CM | POA: Diagnosis present

## 2019-09-16 DIAGNOSIS — R627 Adult failure to thrive: Secondary | ICD-10-CM | POA: Diagnosis present

## 2019-09-16 DIAGNOSIS — A419 Sepsis, unspecified organism: Secondary | ICD-10-CM | POA: Diagnosis present

## 2019-09-16 DIAGNOSIS — M1990 Unspecified osteoarthritis, unspecified site: Secondary | ICD-10-CM | POA: Diagnosis present

## 2019-09-16 DIAGNOSIS — R6251 Failure to thrive (child): Secondary | ICD-10-CM | POA: Diagnosis not present

## 2019-09-16 DIAGNOSIS — Q909 Down syndrome, unspecified: Secondary | ICD-10-CM

## 2019-09-16 DIAGNOSIS — Z8261 Family history of arthritis: Secondary | ICD-10-CM

## 2019-09-16 DIAGNOSIS — M5136 Other intervertebral disc degeneration, lumbar region: Secondary | ICD-10-CM | POA: Diagnosis present

## 2019-09-16 DIAGNOSIS — Z993 Dependence on wheelchair: Secondary | ICD-10-CM | POA: Diagnosis not present

## 2019-09-16 DIAGNOSIS — R1310 Dysphagia, unspecified: Secondary | ICD-10-CM

## 2019-09-16 DIAGNOSIS — E861 Hypovolemia: Secondary | ICD-10-CM | POA: Diagnosis not present

## 2019-09-16 DIAGNOSIS — N179 Acute kidney failure, unspecified: Secondary | ICD-10-CM | POA: Diagnosis present

## 2019-09-16 DIAGNOSIS — L89150 Pressure ulcer of sacral region, unstageable: Secondary | ICD-10-CM | POA: Diagnosis present

## 2019-09-16 DIAGNOSIS — E870 Hyperosmolality and hypernatremia: Secondary | ICD-10-CM | POA: Diagnosis not present

## 2019-09-16 DIAGNOSIS — J9601 Acute respiratory failure with hypoxia: Secondary | ICD-10-CM | POA: Diagnosis present

## 2019-09-16 DIAGNOSIS — I959 Hypotension, unspecified: Secondary | ICD-10-CM | POA: Diagnosis not present

## 2019-09-16 DIAGNOSIS — Z681 Body mass index (BMI) 19 or less, adult: Secondary | ICD-10-CM

## 2019-09-16 DIAGNOSIS — R4701 Aphasia: Secondary | ICD-10-CM | POA: Diagnosis present

## 2019-09-16 DIAGNOSIS — D649 Anemia, unspecified: Secondary | ICD-10-CM | POA: Diagnosis present

## 2019-09-16 DIAGNOSIS — Z841 Family history of disorders of kidney and ureter: Secondary | ICD-10-CM

## 2019-09-16 DIAGNOSIS — Z8249 Family history of ischemic heart disease and other diseases of the circulatory system: Secondary | ICD-10-CM | POA: Diagnosis not present

## 2019-09-16 DIAGNOSIS — R1313 Dysphagia, pharyngeal phase: Secondary | ICD-10-CM | POA: Diagnosis not present

## 2019-09-16 DIAGNOSIS — Z20828 Contact with and (suspected) exposure to other viral communicable diseases: Secondary | ICD-10-CM | POA: Diagnosis present

## 2019-09-16 DIAGNOSIS — E46 Unspecified protein-calorie malnutrition: Secondary | ICD-10-CM | POA: Diagnosis present

## 2019-09-16 DIAGNOSIS — G8220 Paraplegia, unspecified: Secondary | ICD-10-CM | POA: Diagnosis present

## 2019-09-16 DIAGNOSIS — E876 Hypokalemia: Secondary | ICD-10-CM | POA: Diagnosis present

## 2019-09-16 DIAGNOSIS — Z66 Do not resuscitate: Secondary | ICD-10-CM | POA: Diagnosis not present

## 2019-09-16 DIAGNOSIS — I9589 Other hypotension: Secondary | ICD-10-CM | POA: Diagnosis not present

## 2019-09-16 DIAGNOSIS — Z515 Encounter for palliative care: Secondary | ICD-10-CM | POA: Diagnosis not present

## 2019-09-16 DIAGNOSIS — J189 Pneumonia, unspecified organism: Secondary | ICD-10-CM | POA: Diagnosis not present

## 2019-09-16 DIAGNOSIS — R0602 Shortness of breath: Secondary | ICD-10-CM | POA: Diagnosis present

## 2019-09-16 LAB — URINALYSIS, COMPLETE (UACMP) WITH MICROSCOPIC
Bacteria, UA: NONE SEEN
Bilirubin Urine: NEGATIVE
Glucose, UA: NEGATIVE mg/dL
Hgb urine dipstick: NEGATIVE
Ketones, ur: NEGATIVE mg/dL
Leukocytes,Ua: NEGATIVE
Nitrite: NEGATIVE
Protein, ur: NEGATIVE mg/dL
Specific Gravity, Urine: 1.028 (ref 1.005–1.030)
pH: 5 (ref 5.0–8.0)

## 2019-09-16 LAB — COMPREHENSIVE METABOLIC PANEL
ALT: 16 U/L (ref 0–44)
AST: 33 U/L (ref 15–41)
Albumin: 2.3 g/dL — ABNORMAL LOW (ref 3.5–5.0)
Alkaline Phosphatase: 55 U/L (ref 38–126)
Anion gap: 12 (ref 5–15)
BUN: 19 mg/dL (ref 6–20)
CO2: 31 mmol/L (ref 22–32)
Calcium: 8.4 mg/dL — ABNORMAL LOW (ref 8.9–10.3)
Chloride: 102 mmol/L (ref 98–111)
Creatinine, Ser: 1.11 mg/dL (ref 0.61–1.24)
GFR calc Af Amer: >60 mL/min (ref >60)
GFR calc non Af Amer: >60 mL/min (ref >60)
Glucose, Bld: 216 mg/dL — ABNORMAL HIGH (ref 70–99)
Potassium: 3.4 mmol/L — ABNORMAL LOW (ref 3.5–5.1)
Sodium: 145 mmol/L (ref 135–145)
Total Bilirubin: 0.4 mg/dL (ref 0.3–1.2)
Total Protein: 7 g/dL (ref 6.5–8.1)

## 2019-09-16 LAB — CBC
HCT: 28.6 % — ABNORMAL LOW (ref 39.0–52.0)
Hemoglobin: 8.7 g/dL — ABNORMAL LOW (ref 13.0–17.0)
MCH: 29.5 pg (ref 26.0–34.0)
MCHC: 30.4 g/dL (ref 30.0–36.0)
MCV: 96.9 fL (ref 80.0–100.0)
Platelets: 394 10*3/uL (ref 150–400)
RBC: 2.95 MIL/uL — ABNORMAL LOW (ref 4.22–5.81)
RDW: 16.3 % — ABNORMAL HIGH (ref 11.5–15.5)
WBC: 13.7 10*3/uL — ABNORMAL HIGH (ref 4.0–10.5)
nRBC: 0 % (ref 0.0–0.2)

## 2019-09-16 LAB — MRSA PCR SCREENING: MRSA by PCR: NEGATIVE

## 2019-09-16 LAB — SARS CORONAVIRUS 2 (TAT 6-24 HRS): SARS Coronavirus 2: NEGATIVE

## 2019-09-16 MED ORDER — SODIUM CHLORIDE 0.9 % IV SOLN: 500.0000 mg | INTRAVENOUS | Status: DC

## 2019-09-16 MED ORDER — RISAQUAD PO CAPS
1.0000 | ORAL_CAPSULE | Freq: Every day | ORAL | Status: DC
  Administered 2019-09-17 – 2019-09-29 (×13): 1 via ORAL
  Filled 2019-09-16 (×13): qty 1

## 2019-09-16 MED ORDER — SODIUM CHLORIDE 0.9 % IV BOLUS
1000.0000 mL | INTRAVENOUS | Status: DC
  Administered 2019-09-16: 1000 mL via INTRAVENOUS

## 2019-09-16 MED ORDER — GUAIFENESIN ER 600 MG PO TB12
600.0000 mg | ORAL_TABLET | Freq: Two times a day (BID) | ORAL | Status: DC
  Administered 2019-09-18 – 2019-09-24 (×6): 600 mg via ORAL
  Filled 2019-09-16 (×7): qty 1

## 2019-09-16 MED ORDER — SODIUM CHLORIDE 0.9 % IV SOLN
2.0000 g | Freq: Two times a day (BID) | INTRAVENOUS | Status: DC
  Administered 2019-09-17: 2 g via INTRAVENOUS
  Filled 2019-09-16 (×3): qty 2

## 2019-09-16 MED ORDER — TAMSULOSIN HCL 0.4 MG PO CAPS
0.4000 mg | ORAL_CAPSULE | Freq: Every evening | ORAL | Status: DC
  Administered 2019-09-17 – 2019-09-28 (×9): 0.4 mg via ORAL
  Filled 2019-09-16 (×9): qty 1

## 2019-09-16 MED ORDER — IPRATROPIUM-ALBUTEROL 0.5-2.5 (3) MG/3ML IN SOLN: 3.0000 mL | Freq: Four times a day (QID) | RESPIRATORY_TRACT | Status: DC | PRN

## 2019-09-16 MED ORDER — CITALOPRAM HYDROBROMIDE 20 MG PO TABS
10.0000 mg | ORAL_TABLET | Freq: Every day | ORAL | Status: DC
  Administered 2019-09-17 – 2019-09-29 (×13): 10 mg via ORAL
  Filled 2019-09-16 (×13): qty 1

## 2019-09-16 MED ORDER — SODIUM CHLORIDE 0.9 % IV SOLN
2.0000 g | Freq: Once | INTRAVENOUS | Status: AC
  Administered 2019-09-16: 2 g via INTRAVENOUS
  Filled 2019-09-16: qty 2

## 2019-09-16 MED ORDER — ENOXAPARIN SODIUM 40 MG/0.4ML ~~LOC~~ SOLN
40.0000 mg | SUBCUTANEOUS | Status: DC
  Administered 2019-09-17 – 2019-09-19 (×4): 40 mg via SUBCUTANEOUS
  Filled 2019-09-16 (×5): qty 0.4

## 2019-09-16 MED ORDER — SODIUM CHLORIDE 0.9 % IV SOLN: 2.0000 g | INTRAVENOUS | Status: DC

## 2019-09-16 MED ORDER — VANCOMYCIN HCL IN DEXTROSE 1-5 GM/200ML-% IV SOLN
1000.0000 mg | Freq: Once | INTRAVENOUS | Status: AC
  Administered 2019-09-16: 1000 mg via INTRAVENOUS
  Filled 2019-09-16: qty 200

## 2019-09-16 NOTE — ED Notes (Signed)
Pt given ice cream per sister's request

## 2019-09-16 NOTE — ED Notes (Signed)
Lavender, light green, blue and grey tubes sent to lab 

## 2019-09-16 NOTE — Consult Note (Signed)
Pharmacy Antibiotic Note  Joseph Flores is a 58 y.o. male admitted on 09/16/2019 with pneumonia. Patient with Down syndrome presenting to ED from [facility] c/o worsening SOB. Recent diagnosis of PNA on CXR yesterday. WBC 13.7. 11/5 CXR impression of RLL PNA. He was previously admitted at [facility] from 10/8-10/13 with acute respiratory failure secondary to sepsis and aspiration pneumonia. Intubated 10/8-10/10. Pharmacy has been consulted for cefepime dosing.   Plan: Cefepime 2 g q12h (renally adjusted)  Creatinine elevated to 1.11 from recent baseline 0.7-0.8. Adjust antibiotics as indicated per renal function.   Height: _0  (162.6 cm) Weight: 120 lb (54.4 kg) IBW/kg (Calculated) : 59.2  Temp (24hrs), Avg:99 F (37.2 C), Min:99 F (37.2 C), Max:99 F (37.2 C)  Recent Labs  Lab 09/16/19 1030  WBC 13.7*  CREATININE 1.11    Estimated Creatinine Clearance: 56.5 mL/min (by C-G formula based on SCr of 1.11 mg/dL).    No Known Allergies  Antimicrobials this admission: Vancomycin 1 g x 1 in ED Cefepime >>   Antimicrobials admission 10/8-10/13 : Vancomycin 10/8 x 1 Clindamycin 10/8 x1 Azithromycin 10/8-10/12 Cefepime 10/8-10/13  Dose adjustments this admission: N/A  Microbiology results: 11/5 BCx: pending 11/5 Sputum: pending 11/5 MRSA PCR: negative  11/5 COVID-19: pending 10/8 BAL (past admission): normal respiratory flora, rare GPC in pairs on gram strain  Thank you for allowing pharmacy to be a part of this patient's care.  [place] Resident 09/16/2019 5:40 PM

## 2019-09-16 NOTE — ED Notes (Signed)
Report given to floor RN

## 2019-09-16 NOTE — ED Notes (Signed)
Pt given remote and lemon mouth swabs per sister's request
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
## 2019-09-16 NOTE — Consult Note (Addendum)
PHARMACY -  BRIEF ANTIBIOTIC NOTE   Pharmacy has received consult(s) for cefepime and vancomycin from an ED provider.  The patient's profile has been reviewed for ht/wt/allergies/indication/available labs. NKDA.   58 y/o M with Down Syndrome presents from [facility] with worsening SOB and hypoxia. CXR impression RLL. COVID-19 test at facility negative. Recent admission at [facility] 10/8-10/13 with acute respiratory failure 2/2 sepsis and pneumonia and was treated with cefepime and azithromycin. MRSA PCR was negative during last admission. Per medication reconciliation patient was on IV ceftriaxone out-patient day 2 of 5 planned days of therapy.   One time order(s) placed for  -cefepime 2 g already ordered -vancomycin 1 g already ordered  Further antibiotics/pharmacy consults should be ordered by admitting physician if indicated.                       Thank you, [facility] Resident 09/16/2019  2:03 PM
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
## 2019-09-16 NOTE — ED Triage Notes (Signed)
Pt arrives via [facility] from [facility] after reports of intermittent SHOB since being dx with pneumonia on CXR yesterday. Pt has had fevers on and off x 6 days per EMS. PT 90% on RA, placed on 2L [place]. Pt has down syndrome and hollers out at times. Pt has midline in right arm. Covid test that was performed at the facility was negative

## 2019-09-16 NOTE — ED Notes (Signed)
Pt's guardian/sister at bedside

## 2019-09-17 ENCOUNTER — Inpatient Hospital Stay: Payer: Medicare Other

## 2019-09-17 DIAGNOSIS — L89150 Pressure ulcer of sacral region, unstageable: Secondary | ICD-10-CM | POA: Diagnosis present

## 2019-09-17 LAB — CBC
HCT: 26 % — ABNORMAL LOW (ref 39.0–52.0)
Hemoglobin: 8.2 g/dL — ABNORMAL LOW (ref 13.0–17.0)
MCH: 29.5 pg (ref 26.0–34.0)
MCHC: 31.5 g/dL (ref 30.0–36.0)
MCV: 93.5 fL (ref 80.0–100.0)
Platelets: 395 10*3/uL (ref 150–400)
RBC: 2.78 MIL/uL — ABNORMAL LOW (ref 4.22–5.81)
RDW: 16.1 % — ABNORMAL HIGH (ref 11.5–15.5)
WBC: 13.7 10*3/uL — ABNORMAL HIGH (ref 4.0–10.5)
nRBC: 0 % (ref 0.0–0.2)

## 2019-09-17 LAB — COMPREHENSIVE METABOLIC PANEL
ALT: 15 U/L (ref 0–44)
AST: 20 U/L (ref 15–41)
Albumin: 2.2 g/dL — ABNORMAL LOW (ref 3.5–5.0)
Alkaline Phosphatase: 47 U/L (ref 38–126)
Anion gap: 9 (ref 5–15)
BUN: 17 mg/dL (ref 6–20)
CO2: 29 mmol/L (ref 22–32)
Calcium: 8.3 mg/dL — ABNORMAL LOW (ref 8.9–10.3)
Chloride: 108 mmol/L (ref 98–111)
Creatinine, Ser: 0.79 mg/dL (ref 0.61–1.24)
GFR calc Af Amer: >60 mL/min (ref >60)
GFR calc non Af Amer: >60 mL/min (ref >60)
Glucose, Bld: 96 mg/dL (ref 70–99)
Potassium: 3.3 mmol/L — ABNORMAL LOW (ref 3.5–5.1)
Sodium: 146 mmol/L — ABNORMAL HIGH (ref 135–145)
Total Bilirubin: 0.6 mg/dL (ref 0.3–1.2)
Total Protein: 6.5 g/dL (ref 6.5–8.1)

## 2019-09-17 LAB — HIV ANTIBODY (ROUTINE TESTING W REFLEX): HIV Screen 4th Generation wRfx: NONREACTIVE

## 2019-09-17 MED ORDER — COLLAGENASE 250 UNIT/GM EX OINT
TOPICAL_OINTMENT | Freq: Every day | CUTANEOUS | Status: DC
  Administered 2019-09-17 – 2019-09-29 (×13): via TOPICAL
  Filled 2019-09-17 (×2): qty 30

## 2019-09-17 MED ORDER — POTASSIUM CHLORIDE 20 MEQ PO PACK
40.0000 meq | PACK | Freq: Once | ORAL | Status: AC
  Administered 2019-09-17: 40 meq via ORAL
  Filled 2019-09-17: qty 2

## 2019-09-17 MED ORDER — SODIUM CHLORIDE 0.9 % IV SOLN
2.0000 g | Freq: Three times a day (TID) | INTRAVENOUS | Status: DC
  Administered 2019-09-17 – 2019-09-22 (×16): 2 g via INTRAVENOUS
  Filled 2019-09-17 (×20): qty 2

## 2019-09-17 NOTE — NC FL2 (Signed)
[place] LEVEL OF CARE SCREENING TOOL     IDENTIFICATION  Patient Name: Joseph Flores Birthdate: 09/15/1961 Sex: male Admission Date (Current Location): 09/16/2019  [place] and [place] Number:  Engineering geologist and Address:  [facility], [address] [place], [place] [address]      Provider Number: [id number]  Attending Physician Name and Address:  Ezekiel Slocumb, DO  Relative Name and Phone Number:  Selinda Michaels Sisiter [phone]    Current Level of Care: Hospital Recommended Level of Care: [place] Prior Approval Number:    Date Approved/Denied:   PASRR Number: [id number]  Discharge Plan: SNF    Current Diagnoses: Patient Active Problem List   Diagnosis Date Noted  . HCAP (healthcare-associated pneumonia) 09/16/2019  . Pressure injury of skin 08/20/2019  . Goals of care, counseling/discussion   . Palliative care by specialist   . Acute respiratory failure ([place]) 08/19/2019  . Sepsis ([place])   . Community acquired pneumonia   . Snoring 05/21/2017  . Excessive sleepiness 05/21/2017  . Fatigue 05/21/2017  . Eye abnormality 05/21/2017  . Annual physical exam 11/06/2016  . Erroneous encounter - disregard 10/07/2016  . Benign neoplasm of descending colon   . Diarrhea   . Hypotension 07/30/2016  . Elevated blood pressure reading without diagnosis of hypertension 07/18/2016  . Limping 01/08/2016  . Degenerative disc disease, lumbar 12/06/2015  . Down's syndrome 12/06/2015  . Elevated serum creatinine 12/06/2015  . Cardiac murmur 12/06/2015    Orientation RESPIRATION BLADDER Height & Weight     Self  O2(2 liters acute) Incontinent Weight: 56.3 kg Height:  5\' 4"  (162.6 cm)  BEHAVIORAL SYMPTOMS/MOOD NEUROLOGICAL BOWEL NUTRITION STATUS      Incontinent Diet(puree diet)  AMBULATORY STATUS COMMUNICATION OF NEEDS Skin   Total Care Non-Verbally PU Stage and Appropriate Care(Unstageable Sacral  Ulcer)                       Personal Care Assistance Level of Assistance  Total care       Total Care Assistance: Maximum assistance   Functional Limitations Info  Speech     Speech Info: Impaired    SPECIAL CARE FACTORS FREQUENCY                       Contractures Contractures Info: Not present    Additional Factors Info  Code Status, Allergies Code Status Info: full code Allergies Info: no Known Drug allergies           Current Medications (09/17/2019):  This is the current hospital active medication list Current Facility-Administered Medications  Medication Dose Route Frequency Provider Last Rate Last Dose  . acidophilus (RISAQUAD) capsule 1 capsule  1 capsule Oral Daily Lavina Hamman, MD   1 capsule at 09/17/19 1123  . ceFEPIme (MAXIPIME) 2 g in sodium chloride 0.9 % 100 mL IVPB  2 g Intravenous Q8H WangLavonda Jumbo, RPH 200 mL/hr at 09/17/19 1133 2 g at 09/17/19 1133  . citalopram (CELEXA) tablet 10 mg  10 mg Oral Daily Lavina Hamman, MD   10 mg at 09/17/19 1123  . collagenase (SANTYL) ointment   Topical Daily Nicole Kindred A, DO      . enoxaparin (LOVENOX) injection 40 mg  40 mg Subcutaneous Q24H Lavina Hamman, MD   40 mg at 09/17/19 0012  . guaiFENesin (MUCINEX) 12 hr tablet 600 mg  600 mg Oral  Q12H Lavina Hamman, MD      . ipratropium-albuterol (DUONEB) 0.5-2.5 (3) MG/3ML nebulizer solution 3 mL  3 mL Nebulization Q6H PRN Lavina Hamman, MD      . potassium chloride (KLOR-CON) packet 40 mEq  40 mEq Oral Once Nicole Kindred A, DO      . tamsulosin (FLOMAX) capsule 0.4 mg  0.4 mg Oral QPM Lavina Hamman, MD         Discharge Medications: Please see discharge summary for a list of discharge medications.  Relevant Imaging Results:  Relevant Lab Results:   Additional Information SS#[id number]  Su Hilt, RN

## 2019-09-17 NOTE — Consult Note (Signed)
Pharmacy Antibiotic Note  Joseph Flores is a 58 y.o. male admitted on 09/16/2019 with pneumonia. Patient with Down syndrome presenting to ED from [facility] c/o worsening SOB. Recent diagnosis of PNA on CXR yesterday. WBC 13.7. 11/5 CXR impression of RLL PNA. He was previously admitted at [facility] from 10/8-10/13 with acute respiratory failure secondary to sepsis and aspiration pneumonia. Intubated 10/8-10/10. Pharmacy has been consulted for cefepime dosing.   Plan: Adjust Cefepime 2 g IV q12h to q8h for improved renal function.    Height: _0  (162.6 cm) Weight: 124 lb 1.9 oz (56.3 kg) IBW/kg (Calculated) : 59.2  Temp (24hrs), Avg:98.4 F (36.9 C), Min:97.9 F (36.6 C), Max:99 F (37.2 C)  Recent Labs  Lab 09/16/19 1030 09/17/19 0510  WBC 13.7* 13.7*  CREATININE 1.11 0.79    Estimated Creatinine Clearance: 81.1 mL/min (by C-G formula based on SCr of 0.79 mg/dL).    No Known Allergies  Antimicrobials this admission: Vancomycin 1 g x 1 in ED on 11/5 Cefepime 11/5 >>   Antimicrobials admission 10/8-10/13 : Vancomy1 Clindamycin cin 10/8 x 10/8 x1 Azithromycin 10/8-10/12 Cefepime 10/8-10/13  Dose adjustments this admission: 11/6 Adjust Cefepime 2 g IV q12h to q8h  Microbiology results: 11/5 BCx: pending 11/5 Sputum: ordered 11/5 MRSA PCR: negative  11/5 COVID-19: negative 10/8 BAL (past admission): normal respiratory flora, rare GPC in pairs on gram strain  Thank you for allowing pharmacy to be a part of this patient's care.  Rayna Sexton, PharmD, BCPS Clinical Pharmacist 09/17/2019 10:01 AM

## 2019-09-17 NOTE — Progress Notes (Signed)
PT Cancellation Note  Patient Details Name: Joseph Flores MRN: [id number] DOB: [birth year]   Cancelled Treatment:    Reason Eval/Treat Not Completed: Patient's level of consciousness. Chart reviewed, evaluation attempted. Pt awake and supine in bed upon entry, visual tracting respondant to author's greeting and other verbal stimulus. Pt does not make eye contact for long, does not respond meaningfully to questioning or cuing. Pt not following commands. SpO2 98% on 2L via [place]. BUE largely flaccid when assessed. Bilat feet respond to tactile stimulus with volitional movement. Pt alternately calm then sobbing/crying/grimacing for 3 seconds every 15 seconds on what appears to be a continuous loop irrespective of verbal or tactile stimulus. Will hold PT evaluation until pt is moe interactive and appears to not be in acute distress.   9:35 AM, 09/17/19 Etta Grandchild, PT, DPT Physical Therapist - [facility]  [phone] ([place])     Buccola,Allan C 09/17/2019, 9:32 AM

## 2019-09-17 NOTE — Consult Note (Signed)
[place] Nurse wound consult note Patient receiving care in [facility].  Assisted with turning and positioning by primary RN. Reason for Consult: sacral wound Wound type: unstageable PI Pressure Injury POA: Yes Measurement: 6 cm x 7 cm Wound bed: 100% yellow/brown slough Drainage (amount, consistency, odor) none Periwound: hypopigmented Dressing procedure/placement/frequency: Apply Santyl to  in a nickel thick layer. Cover with a saline moistened gauze, then dry gauze or ABD pad.  Change daily. Also, a low air loss mattress and use ONLY ONE DermaTherapy pad beneath patient's buttocks. Do NOT use disposable pads.  Monitor the wound area(s) for worsening of condition such as: Signs/symptoms of infection,  Increase in size,  Development of or worsening of odor, Development of pain, or increased pain at the affected locations.  Notify the medical team if any of these develop.  Thank you for the consult.  Discussed plan of care with the bedside nurse.  [place] nurse will not follow at this time.  Please re-consult the [facility] team if needed.  Val Riles, RN, MSN, CWOCN, CNS-BC, pager [phone]

## 2019-09-17 NOTE — Progress Notes (Signed)
PROGRESS NOTE    GWEN EDLER  MCN:[id number] DOB: Mar 31, 1961 DOA: 09/16/2019  PCP: Dianne Dun, MD    LOS - 1   Brief Narrative:  58 y.o. male with Past medical history of Down syndrome, dysphagia.  Presented from [facility] with cough and shortness of breath which had been worsening for 2 days, in addition to intermittent fevers over past 6 days.  He reportedly became hypoxic yesterday and was placed on oxygen.  HC-POA at time of admission reported chronic difficulty with swallowing and modified barium swallow study was scheduled for 11/23.  Patient essentially nonverbal, so subjective history limited.  Chest xray showed right lower lobe pneumonia.  Patient with hypoxia on admission which improved with 2L/min O2 by [place].  He is admitted for further evaluation and management of healthcare-associated vs aspiration pneumonia.  Subjective 11/6: Patient seen and examined, awake in bed with head elevated.  Appears in no acute distress.  No acute events reported overnight.  Unable to understand patient attempts to verbalize.  He does shake his head no when asked about pain or discomfort or difficulty breathing.  Assessment & Plan:   Principal Problem:   HCAP (healthcare-associated pneumonia) Active Problems:   Down's syndrome   Pressure injury of skin   Unstageable pressure ulcer of sacral region ([place])  Sepsis secondary to pneumonia, present on admission Healthcare-Associated vs Aspiration Pneumonia - by history and given RLL involvement, suspect this is due to aspiration.  MRSA screen negative.  COVID negative.  Chest xray with RLL infiltrate.  Met sepsis criteria on admission with leukocytosis and hypotension in setting of PNA. - continue IV Cefepime for now - follow up cultures - speech therapy consulted, NPO until eval - continue IV fluids  Dysphagia - chronic.  On admission, HC-POA reported thinking about PEG tube, was advised that PEG does not prevent aspiration, patient  would remain at risk for aspiration. - speech therapy consulted - palliative consulted for [place] discussion  Acute Kidney Injury - present on admission.  Baseline Cr about 0.7,  On admission was 1.11.  Due to sepsis most likely.  IV fluids as above and monitor with serial BMP's  Chronic anemia - stable, no evidence of active bleeding. - serial CBC's to monitor  Unstagable Sacral Pressure Ulcer, present on admission - wound care consulted   DVT prophylaxis: Lovenox   Code Status: Full Code  Family Communication: sister, [name] updated by phone 11/6 afternoon Disposition Plan:  Pending clinical improvement.  Return to SNF in 2-3 days (estimate).   Consultants:   none  Procedures:   none  Antimicrobials:   Cefepime, start 11/5, planned stop 11/11    Objective: Vitals:   09/17/19 0103 09/17/19 0749 09/17/19 1535 09/17/19 1535  BP:  96/72 98/61 98/61   Pulse:  88 (!) 101 97  Resp:  16 16 16   Temp:  98.2 F (36.8 C) 98.8 F (37.1 C) 98.8 F (37.1 C)  TempSrc:  Axillary Oral Oral  SpO2:  100% 98% 99%  Weight: 56.3 kg     Height:        Intake/Output Summary (Last 24 hours) at 09/17/2019 2036 Last data filed at 09/17/2019 1800 Gross per 24 hour  Intake 200 ml  Output -  Net 200 ml   Filed Weights   09/16/19 1237 09/17/19 0103  Weight: 54.4 kg 56.3 kg    Examination:  General exam: awake, alert, no acute distress Respiratory system: decreased breath sounds with shallow respirations, no wheezes, rales  or rhonchi audible with exam limited by poor inspirations  Cardiovascular system: normal S1/S2, RRR, no pedal edema.   Gastrointestinal system: soft, non-tender, non-distended abdomen, normal bowel sounds. Central nervous system: no gross focal neurologic deficits, follows most commands Extremities: no cyanosis, no edema, normal tone Skin: dry, intact, normal temperature Psychiatry: normal mood, congruent affect   Data Reviewed: I have personally reviewed  following labs and imaging studies  CBC: Recent Labs  Lab 09/16/19 1030 09/17/19 0510  WBC 13.7* 13.7*  HGB 8.7* 8.2*  HCT 28.6* 26.0*  MCV 96.9 93.5  PLT 394 144   Basic Metabolic Panel: Recent Labs  Lab 09/16/19 1030 09/17/19 0510  NA 145 146*  K 3.4* 3.3*  CL 102 108  CO2 31 29  GLUCOSE 216* 96  BUN 19 17  CREATININE 1.11 0.79  CALCIUM 8.4* 8.3*   GFR: Estimated Creatinine Clearance: 81.1 mL/min (by C-G formula based on SCr of 0.79 mg/dL). Liver Function Tests: Recent Labs  Lab 09/16/19 1030 09/17/19 0510  AST 33 20  ALT 16 15  ALKPHOS 55 47  BILITOT 0.4 0.6  PROT 7.0 6.5  ALBUMIN 2.3* 2.2*   No results for input(s): LIPASE, AMYLASE in the last 168 hours. No results for input(s): AMMONIA in the last 168 hours. Coagulation Profile: No results for input(s): INR, PROTIME in the last 168 hours. Cardiac Enzymes: No results for input(s): CKTOTAL, CKMB, CKMBINDEX, TROPONINI in the last 168 hours. BNP (last 3 results) No results for input(s): PROBNP in the last 8760 hours. HbA1C: No results for input(s): HGBA1C in the last 72 hours. CBG: No results for input(s): GLUCAP in the last 168 hours. Lipid Profile: No results for input(s): CHOL, HDL, LDLCALC, TRIG, CHOLHDL, LDLDIRECT in the last 72 hours. Thyroid Function Tests: No results for input(s): TSH, T4TOTAL, FREET4, T3FREE, THYROIDAB in the last 72 hours. Anemia Panel: No results for input(s): VITAMINB12, FOLATE, FERRITIN, TIBC, IRON, RETICCTPCT in the last 72 hours. Sepsis Labs: No results for input(s): PROCALCITON, LATICACIDVEN in the last 168 hours.  Recent Results (from the past 240 hour(s))  SARS CORONAVIRUS 2 (TAT 6-24 HRS) Nasopharyngeal Nasopharyngeal Swab     Status: None   Collection Time: 09/16/19 12:47 PM   Specimen: Nasopharyngeal Swab  Result Value Ref Range Status   SARS Coronavirus 2 NEGATIVE NEGATIVE Final    Comment: (NOTE) SARS-CoV-2 target nucleic acids are NOT DETECTED. The  SARS-CoV-2 RNA is generally detectable in upper and lower respiratory specimens during the acute phase of infection. Negative results do not preclude SARS-CoV-2 infection, do not rule out co-infections with other pathogens, and should not be used as the sole basis for treatment or other patient management decisions. Negative results must be combined with clinical observations, patient history, and epidemiological information. The expected result is Negative. Fact Sheet for Patients: SugarRoll.be Fact Sheet for Healthcare Providers: https://www.woods-mathews.com/ This test is not yet approved or cleared by the [place] FDA and  has been authorized for detection and/or diagnosis of SARS-CoV-2 by FDA under an Emergency Use Authorization (EUA). This EUA will remain  in effect (meaning this test can be used) for the duration of the COVID-19 declaration under Section 56 4(b)(1) of the Act, 21 U.S.C. section 360bbb-3(b)(1), unless the authorization is terminated or revoked sooner. Performed at [facility], [place] [address], [place], [place] [address]   MRSA PCR Screening     Status: None   Collection Time: 09/16/19  2:30 PM   Specimen: Nasopharyngeal  Result Value  Ref Range Status   MRSA by PCR NEGATIVE NEGATIVE Final    Comment:        The GeneXpert MRSA Assay (FDA approved for NASAL specimens only), is one component of a comprehensive MRSA colonization surveillance program. It is not intended to diagnose MRSA infection nor to guide or monitor treatment for MRSA infections. Performed at [facility], [address], [place], [place] [address]          Radiology Studies: Dg Chest Portable 1 View  Result Date: 09/16/2019 CLINICAL DATA:  Intermittent fevers and shortness of breath. EXAM: PORTABLE CHEST 1 VIEW COMPARISON:  Chest x-ray dated August 21, 2019. FINDINGS: The heart size and mediastinal contours are within  normal limits. Normal pulmonary vascularity. Right lower lobe consolidation. Probable small right pleural effusion. No pneumothorax. No acute osseous abnormality. IMPRESSION: Right lower lobe pneumonia. Electronically Signed   By: Titus Dubin M.D.   On: 09/16/2019 13:44        Scheduled Meds: . acidophilus  1 capsule Oral Daily  . citalopram  10 mg Oral Daily  . collagenase   Topical Daily  . enoxaparin (LOVENOX) injection  40 mg Subcutaneous Q24H  . guaiFENesin  600 mg Oral Q12H  . tamsulosin  0.4 mg Oral QPM   Continuous Infusions: . ceFEPime (MAXIPIME) IV 2 g (09/17/19 1133)     LOS: 1 day    Time spent: 30-35 min    Ezekiel Slocumb, DO [facility] Pager: [phone]  If 7PM-7AM, please contact night-coverage [email or web address] Password [place] [facility] 09/17/2019, 8:36 PM
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
## 2019-09-18 DIAGNOSIS — Q909 Down syndrome, unspecified: Secondary | ICD-10-CM

## 2019-09-18 LAB — CBC WITH DIFFERENTIAL/PLATELET
Abs Immature Granulocytes: 0.7 10*3/uL — ABNORMAL HIGH (ref 0.00–0.07)
Band Neutrophils: 2 %
Basophils Absolute: 0 10*3/uL (ref 0.0–0.1)
Basophils Relative: 0 %
Eosinophils Absolute: 0 10*3/uL (ref 0.0–0.5)
Eosinophils Relative: 0 %
HCT: 26.6 % — ABNORMAL LOW (ref 39.0–52.0)
Hemoglobin: 8.3 g/dL — ABNORMAL LOW (ref 13.0–17.0)
Lymphocytes Relative: 19 %
Lymphs Abs: 1.8 10*3/uL (ref 0.7–4.0)
MCH: 29.2 pg (ref 26.0–34.0)
MCHC: 31.2 g/dL (ref 30.0–36.0)
MCV: 93.7 fL (ref 80.0–100.0)
Metamyelocytes Relative: 1 %
Monocytes Absolute: 0.6 10*3/uL (ref 0.1–1.0)
Monocytes Relative: 6 %
Myelocytes: 6 %
Neutro Abs: 6.4 10*3/uL (ref 1.7–7.7)
Neutrophils Relative %: 66 %
Platelets: 449 10*3/uL — ABNORMAL HIGH (ref 150–400)
RBC: 2.84 MIL/uL — ABNORMAL LOW (ref 4.22–5.81)
RDW: 16.5 % — ABNORMAL HIGH (ref 11.5–15.5)
WBC: 9.4 10*3/uL (ref 4.0–10.5)
nRBC: 0.2 % (ref 0.0–0.2)

## 2019-09-18 LAB — BASIC METABOLIC PANEL
Anion gap: 12 (ref 5–15)
BUN: 17 mg/dL (ref 6–20)
CO2: 29 mmol/L (ref 22–32)
Calcium: 8.5 mg/dL — ABNORMAL LOW (ref 8.9–10.3)
Chloride: 105 mmol/L (ref 98–111)
Creatinine, Ser: 0.86 mg/dL (ref 0.61–1.24)
GFR calc Af Amer: >60 mL/min (ref >60)
GFR calc non Af Amer: >60 mL/min (ref >60)
Glucose, Bld: 91 mg/dL (ref 70–99)
Potassium: 3.5 mmol/L (ref 3.5–5.1)
Sodium: 146 mmol/L — ABNORMAL HIGH (ref 135–145)

## 2019-09-18 MED ORDER — DEXTROSE-NACL 5-0.45 % IV SOLN
INTRAVENOUS | Status: AC
  Administered 2019-09-18 – 2019-09-19 (×3): via INTRAVENOUS

## 2019-09-18 MED ORDER — ACETAMINOPHEN 500 MG PO TABS
1000.0000 mg | ORAL_TABLET | Freq: Four times a day (QID) | ORAL | Status: DC | PRN
  Administered 2019-09-18 – 2019-09-29 (×19): 1000 mg via ORAL
  Filled 2019-09-18 (×19): qty 2

## 2019-09-18 NOTE — Progress Notes (Signed)
PT Cancellation Note  Patient Details Name: Joseph Flores MRN: [id number] DOB: [birth year]   Cancelled Treatment:    Reason Eval/Treat Not Completed: Patient's level of consciousness(Chart reviewed, evaluation attempted. Pt awake and supine in bed upon entry, visual tracting respondant to author's greeting and other verbal stimulus.)   Alanson Puls, PT DPT 09/18/2019, 9:59 AM

## 2019-09-18 NOTE — Progress Notes (Signed)
PROGRESS NOTE    Joseph Flores  VVZ:[id number] DOB: April 27, 1961 DOA: 09/16/2019  PCP: Joseph Dun, MD    LOS - 2   Brief Narrative:  58 y.o.malewith Past medical history ofDown syndrome, dysphagia.  Presented from [facility] with cough and shortness of breath which had been worsening for 2 days, in addition to intermittent fevers over past 6 days.  He reportedly became hypoxic yesterday and was placed on oxygen.  HC-POA at time of admission reported chronic difficulty with swallowing and modified barium swallow study was scheduled for 11/23.  Patient essentially nonverbal, so subjective history limited.  Chest xray showed right lower lobe pneumonia.  Patient with hypoxia on admission which improved with 2L/min O2 by [place].  He is admitted for further evaluation and management of healthcare-associated vs aspiration pneumonia.  Subjective 11/7: Patient seen awake, laying on his left side in bed, no apparent distress.  Unable to elicit complaints as patient nonverbal.  No acute events reported overnight.  Assessment & Plan:   Principal Problem:   HCAP (healthcare-associated pneumonia) Active Problems:   Down's syndrome   Pressure injury of skin   Unstageable pressure ulcer of sacral region ([place])   Sepsis secondary to pneumonia, present on admission Healthcare-Associated vs Aspiration Pneumonia - by history and given RLL involvement, suspect this is due to aspiration.  MRSA screen negative.  COVID negative.  Chest xray with RLL infiltrate.  Met sepsis criteria on admission with leukocytosis and hypotension in setting of PNA. - continue IV Cefepime for now - follow up cultures - speech therapy consulted, recs below - continue maintenance IV fluids  Dysphagia - chronic.  On admission, HC-POA reported thinking about PEG tube, was advised that PEG does not prevent aspiration, patient would remain at risk for aspiration. - speech therapy consulted, recommends:  - dysphagia 1  (pureed) diet, nectar thick liquids - palliative consulted for [place] discussion  Hypernatremia - Na 146 x 2 days, likely due to poor PO intake.  Will start D5-1/2NS for maintenance fluid.  Recheck BMP in AM.  Acute Kidney Injury - resolved.  Present on admission.  Baseline Cr about 0.7,  On admission was 1.11.  Due to sepsis most likely.  IV fluids as above and monitor with serial BMP's  Chronic anemia - stable, no evidence of active bleeding. - serial CBC's to monitor  Unstagable Sacral Pressure Ulcer, present on admission - wound care consulted   DVT prophylaxis: Lovenox   Code Status: Full Code  Family Communication: sister, Joseph Flores updated by phone 11/6 afternoon Disposition Plan:  Pending clinical improvement.  Return to SNF in 2-3 days (estimate).   Consultants:   none  Procedures:   none  Antimicrobials:   Cefepime, start 11/5, planned stop 11/11    Objective: Vitals:   09/17/19 1535 09/17/19 1535 09/17/19 2349 09/18/19 0745  BP: 98/61 98/61 94/68  (!) 81/53  Pulse: (!) 101 97 95 80  Resp: 16 16 19    Temp: 98.8 F (37.1 C) 98.8 F (37.1 C) 98.1 F (36.7 C) 98.1 F (36.7 C)  TempSrc: Oral Oral Oral Oral  SpO2: 98% 99% 97% 100%  Weight:      Height:        Intake/Output Summary (Last 24 hours) at 09/18/2019 1231 Last data filed at 09/18/2019 0904 Gross per 24 hour  Intake 200 ml  Output 300 ml  Net -100 ml   Filed Weights   09/16/19 1237 09/17/19 0103  Weight: 54.4 kg 56.3 kg  Examination:  General exam: awake, alert, no acute distress, laying on left side Respiratory system: diminished at right base, otherwise clear, no wheezes, rales or rhonchi, normal respiratory effort. Cardiovascular system: normal S1/S2, RRR, no JVD, murmurs, rubs, gallops, no pedal edema.   Gastrointestinal system: soft, non-tender, non-distended abdomen Extremities: no cyanosis, no edema Skin: dry, intact, normal temperature   Data Reviewed: I have personally  reviewed following labs and imaging studies  CBC: Recent Labs  Lab 09/16/19 1030 09/17/19 0510 09/18/19 0458  WBC 13.7* 13.7* 9.4  NEUTROABS  --   --  6.4  HGB 8.7* 8.2* 8.3*  HCT 28.6* 26.0* 26.6*  MCV 96.9 93.5 93.7  PLT 394 395 798*   Basic Metabolic Panel: Recent Labs  Lab 09/16/19 1030 09/17/19 0510 09/18/19 0458  NA 145 146* 146*  K 3.4* 3.3* 3.5  CL 102 108 105  CO2 31 29 29   GLUCOSE 216* 96 91  BUN 19 17 17   CREATININE 1.11 0.79 0.86  CALCIUM 8.4* 8.3* 8.5*   GFR: Estimated Creatinine Clearance: 75.5 mL/min (by C-G formula based on SCr of 0.86 mg/dL). Liver Function Tests: Recent Labs  Lab 09/16/19 1030 09/17/19 0510  AST 33 20  ALT 16 15  ALKPHOS 55 47  BILITOT 0.4 0.6  PROT 7.0 6.5  ALBUMIN 2.3* 2.2*   No results for input(s): LIPASE, AMYLASE in the last 168 hours. No results for input(s): AMMONIA in the last 168 hours. Coagulation Profile: No results for input(s): INR, PROTIME in the last 168 hours. Cardiac Enzymes: No results for input(s): CKTOTAL, CKMB, CKMBINDEX, TROPONINI in the last 168 hours. BNP (last 3 results) No results for input(s): PROBNP in the last 8760 hours. HbA1C: No results for input(s): HGBA1C in the last 72 hours. CBG: No results for input(s): GLUCAP in the last 168 hours. Lipid Profile: No results for input(s): CHOL, HDL, LDLCALC, TRIG, CHOLHDL, LDLDIRECT in the last 72 hours. Thyroid Function Tests: No results for input(s): TSH, T4TOTAL, FREET4, T3FREE, THYROIDAB in the last 72 hours. Anemia Panel: No results for input(s): VITAMINB12, FOLATE, FERRITIN, TIBC, IRON, RETICCTPCT in the last 72 hours. Sepsis Labs: No results for input(s): PROCALCITON, LATICACIDVEN in the last 168 hours.  Recent Results (from the past 240 hour(s))  SARS CORONAVIRUS 2 (TAT 6-24 HRS) Nasopharyngeal Nasopharyngeal Swab     Status: None   Collection Time: 09/16/19 12:47 PM   Specimen: Nasopharyngeal Swab  Result Value Ref Range Status   SARS  Coronavirus 2 NEGATIVE NEGATIVE Final    Comment: (NOTE) SARS-CoV-2 target nucleic acids are NOT DETECTED. The SARS-CoV-2 RNA is generally detectable in upper and lower respiratory specimens during the acute phase of infection. Negative results do not preclude SARS-CoV-2 infection, do not rule out co-infections with other pathogens, and should not be used as the sole basis for treatment or other patient management decisions. Negative results must be combined with clinical observations, patient history, and epidemiological information. The expected result is Negative. Fact Sheet for Patients: SugarRoll.be Fact Sheet for Healthcare Providers: https://www.woods-mathews.com/ This test is not yet approved or cleared by the [place] FDA and  has been authorized for detection and/or diagnosis of SARS-CoV-2 by FDA under an Emergency Use Authorization (EUA). This EUA will remain  in effect (meaning this test can be used) for the duration of the COVID-19 declaration under Section 56 4(b)(1) of the Act, 21 U.S.C. section 360bbb-3(b)(1), unless the authorization is terminated or revoked sooner. Performed at [facility], [place] [address]  [place], [place] [address]   MRSA PCR Screening     Status: None   Collection Time: 09/16/19  2:30 PM   Specimen: Nasopharyngeal  Result Value Ref Range Status   MRSA by PCR NEGATIVE NEGATIVE Final    Comment:        The GeneXpert MRSA Assay (FDA approved for NASAL specimens only), is one component of a comprehensive MRSA colonization surveillance program. It is not intended to diagnose MRSA infection nor to guide or monitor treatment for MRSA infections. Performed at [facility], [address] [place], [place] [address]          Radiology Studies: Dg Chest Portable 1 View  Result Date: 09/16/2019 CLINICAL DATA:  Intermittent fevers and shortness of breath. EXAM: PORTABLE CHEST 1  VIEW COMPARISON:  Chest x-ray dated August 21, 2019. FINDINGS: The heart size and mediastinal contours are within normal limits. Normal pulmonary vascularity. Right lower lobe consolidation. Probable small right pleural effusion. No pneumothorax. No acute osseous abnormality. IMPRESSION: Right lower lobe pneumonia. Electronically Signed   By: Titus Dubin M.D.   On: 09/16/2019 13:44        Scheduled Meds: . acidophilus  1 capsule Oral Daily  . citalopram  10 mg Oral Daily  . collagenase   Topical Daily  . enoxaparin (LOVENOX) injection  40 mg Subcutaneous Q24H  . guaiFENesin  600 mg Oral Q12H  . tamsulosin  0.4 mg Oral QPM   Continuous Infusions: . ceFEPime (MAXIPIME) IV 2 g (09/18/19 1120)     LOS: 2 days    Time spent: 25-30 min    Ezekiel Slocumb, DO [facility] Pager: [phone]  If 7PM-7AM, please contact night-coverage [email or web address] Password [facility] 09/18/2019, 12:31 PM

## 2019-09-19 DIAGNOSIS — R1310 Dysphagia, unspecified: Secondary | ICD-10-CM

## 2019-09-19 LAB — CBC WITH DIFFERENTIAL/PLATELET
Abs Immature Granulocytes: 1.32 10*3/uL — ABNORMAL HIGH (ref 0.00–0.07)
Basophils Absolute: 0 10*3/uL (ref 0.0–0.1)
Basophils Relative: 0 %
Eosinophils Absolute: 0.1 10*3/uL (ref 0.0–0.5)
Eosinophils Relative: 1 %
HCT: 26.4 % — ABNORMAL LOW (ref 39.0–52.0)
Hemoglobin: 8.1 g/dL — ABNORMAL LOW (ref 13.0–17.0)
Immature Granulocytes: 14 %
Lymphocytes Relative: 15 %
Lymphs Abs: 1.5 10*3/uL (ref 0.7–4.0)
MCH: 28.7 pg (ref 26.0–34.0)
MCHC: 30.7 g/dL (ref 30.0–36.0)
MCV: 93.6 fL (ref 80.0–100.0)
Monocytes Absolute: 0.9 10*3/uL (ref 0.1–1.0)
Monocytes Relative: 9 %
Neutro Abs: 6 10*3/uL (ref 1.7–7.7)
Neutrophils Relative %: 61 %
Platelets: 410 10*3/uL — ABNORMAL HIGH (ref 150–400)
RBC: 2.82 MIL/uL — ABNORMAL LOW (ref 4.22–5.81)
RDW: 16.3 % — ABNORMAL HIGH (ref 11.5–15.5)
WBC: 9.7 10*3/uL (ref 4.0–10.5)
nRBC: 0.3 % — ABNORMAL HIGH (ref 0.0–0.2)

## 2019-09-19 LAB — BASIC METABOLIC PANEL
Anion gap: 11 (ref 5–15)
BUN: 15 mg/dL (ref 6–20)
CO2: 30 mmol/L (ref 22–32)
Calcium: 8.1 mg/dL — ABNORMAL LOW (ref 8.9–10.3)
Chloride: 102 mmol/L (ref 98–111)
Creatinine, Ser: 0.83 mg/dL (ref 0.61–1.24)
GFR calc Af Amer: >60 mL/min (ref >60)
GFR calc non Af Amer: >60 mL/min (ref >60)
Glucose, Bld: 109 mg/dL — ABNORMAL HIGH (ref 70–99)
Potassium: 3 mmol/L — ABNORMAL LOW (ref 3.5–5.1)
Sodium: 143 mmol/L (ref 135–145)

## 2019-09-19 MED ORDER — POTASSIUM CHLORIDE 20 MEQ PO PACK
40.0000 meq | PACK | ORAL | Status: AC
  Administered 2019-09-19 (×2): 40 meq via ORAL
  Filled 2019-09-19 (×2): qty 2

## 2019-09-19 MED ORDER — SODIUM CHLORIDE 0.45 % IV BOLUS
500.0000 mL | Freq: Once | INTRAVENOUS | Status: AC
  Administered 2019-09-19: 500 mL via INTRAVENOUS

## 2019-09-19 MED ORDER — SODIUM CHLORIDE 0.9 % IV BOLUS
500.0000 mL | Freq: Once | INTRAVENOUS | Status: AC
  Administered 2019-09-19: 500 mL via INTRAVENOUS

## 2019-09-19 NOTE — Progress Notes (Signed)
PT Cancellation Note  Patient Details Name: Joseph Flores MRN: [id number] DOB: 02/19/1961   Cancelled Treatment:    Reason Eval/Treat Not Completed: Fatigue/lethargy limiting ability to participate  Pt awake and supine in bed upon entry, visual tracting respondant to author's greeting and other verbal stimulus.)   Alanson Puls , PT DPT 09/19/2019, 11:28 AM

## 2019-09-19 NOTE — Progress Notes (Addendum)
PROGRESS NOTE    Joseph Flores  [id number] DOB: [birth year] DOA: 09/16/2019  PCP: Dianne Dun, MD    LOS - 3   Addendum - code status changed to DNR after discussion with patient's sister and Kevan Rosebush at bedside this afternoon.  Brief Narrative: 58 y.o.malewith Past medical history ofDown syndrome, dysphagia.[facility] and shortness of breathwhich had been worsening for 2 days, in addition to intermittent fevers over past 6 days.He reportedly became hypoxic yesterday and was placed on oxygen. HC-POA at time of admission reported chronic difficulty with swallowing and modified barium swallow study was scheduled for 11/23. Patient essentially nonverbal, so subjective history limited. Chest xray showed right lower lobe pneumonia. Patient with hypoxia on admission which improved with 2L/min O2 by [place]. He is admitted for further evaluation and management of healthcare-associated vs aspiration pneumonia.  Subjective 11/8: Patient seen and examined sleeping comfortably, awoke easily.  No acute events reported overnight.  Patient nonverbal and unable to communicate complaints, but appears in no acute distress.  Assessment & Plan:   Principal Problem:   HCAP (healthcare-associated pneumonia) Active Problems:   Hypotension   Down's syndrome   Pressure injury of skin   Unstageable pressure ulcer of sacral region ([place])   Dysphagia   Sepsis secondary to pneumonia, present on admission Healthcare-Associated vs Aspiration Pneumonia- by history and given RLL involvement, suspect this is due to aspiration. MRSA screen negative. COVID negative. Chest xray with RLL infiltrate. Met sepsis criteria on admission with leukocytosis and hypotension in setting of PNA. - continue IV Cefepime for now - follow up cultures - speech therapy consulted, recs below - continue maintenance IV fluids  Hypotension -BP this morning 83/62.  Give 500 cc bolus  this morning and a 2nd this afternoon.  Every 4 hours vitals for the next 24 hours.  Dysphagia - chronic. On admission, HC-POA reported thinking about PEG tube, was advised that PEG does not prevent aspiration, patient would remain at risk for aspiration. - speech therapy consulted, recommends:             - dysphagia 1 (pureed) diet, nectar thick liquids - palliative consulted for [place] discussion  Hypernatremia -resolved with D5-1/2 NS.  Na 146 x 2 days, likely due to poor PO intake.  Recheck BMP in AM.  Acute Kidney Injury - resolved.  Present on admission. Baseline Cr about 0.7, On admission was 1.11. Due to sepsis most likely. IV fluids as above and monitor with serial BMP's  Chronic anemia - stable, no evidence of active bleeding. - serial CBC's to monitor  Unstagable Sacral Pressure Ulcer, present on admission - wound care consulted   DVT prophylaxis:Lovenox Code Status: Full Code Family Communication:sister, [name] updated by phone 11/8 afternoon Disposition Plan:Pending clinical improvement. Return to SNF.  Consultants:  none  Procedures:  none  Antimicrobials:  Cefepime, start 11/5, planned stop 11/11   Objective: Vitals:   09/18/19 2030 09/19/19 0336 09/19/19 0654 09/19/19 1041  BP: (!) 85/62 92/67 (!) 83/62 (!) 88/68  Pulse: (!) 102 88 78 73  Resp:  16    Temp: 99 F (37.2 C) 97.6 F (36.4 C) (!) 97.3 F (36.3 C)   TempSrc: Axillary Axillary Axillary   SpO2: 100% 100% 100%   Weight:      Height:        Intake/Output Summary (Last 24 hours) at 09/19/2019 1459 Last data filed at 09/19/2019 0747 Gross per 24 hour  Intake 1557.27 ml  Output  400 ml  Net 1157.27 ml   Filed Weights   09/16/19 1237 09/17/19 0103  Weight: 54.4 kg 56.3 kg    Examination:  General exam: awake, alert, no acute distress Respiratory system: Decreased breath sounds, no wheezes, rales or rhonchi on exam limited by patient mumbling during  auscultation, normal respiratory effort. Cardiovascular system: normal S1/S2, RRR, no JVD, murmurs, rubs, gallops,  no pedal edema.   Gastrointestinal system: soft, non-tender, non-distended abdomen, normal bowel sounds. Extremities: no edema, no cyanosis Skin: dry, intact, normal temperature    Data Reviewed: I have personally reviewed following labs and imaging studies  CBC: Recent Labs  Lab 09/16/19 1030 09/17/19 0510 09/18/19 0458 09/19/19 0552  WBC 13.7* 13.7* 9.4 9.7  NEUTROABS  --   --  6.4 6.0  HGB 8.7* 8.2* 8.3* 8.1*  HCT 28.6* 26.0* 26.6* 26.4*  MCV 96.9 93.5 93.7 93.6  PLT 394 395 449* 975*   Basic Metabolic Panel: Recent Labs  Lab 09/16/19 1030 09/17/19 0510 09/18/19 0458 09/19/19 0552  NA 145 146* 146* 143  K 3.4* 3.3* 3.5 3.0*  CL 102 108 105 102  CO2 _0 GLUCOSE 216* 96 91 109*  BUN _1 CREATININE 1.11 0.79 0.86 0.83  CALCIUM 8.4* 8.3* 8.5* 8.1*   GFR: Estimated Creatinine Clearance: 78.2 mL/min (by C-G formula based on SCr of 0.83 mg/dL). Liver Function Tests: Recent Labs  Lab 09/16/19 1030 09/17/19 0510  AST 33 20  ALT 16 15  ALKPHOS 55 47  BILITOT 0.4 0.6  PROT 7.0 6.5  ALBUMIN 2.3* 2.2*   No results for input(s): LIPASE, AMYLASE in the last 168 hours. No results for input(s): AMMONIA in the last 168 hours. Coagulation Profile: No results for input(s): INR, PROTIME in the last 168 hours. Cardiac Enzymes: No results for input(s): CKTOTAL, CKMB, CKMBINDEX, TROPONINI in the last 168 hours. BNP (last 3 results) No results for input(s): PROBNP in the last 8760 hours. HbA1C: No results for input(s): HGBA1C in the last 72 hours. CBG: No results for input(s): GLUCAP in the last 168 hours. Lipid Profile: No results for input(s): CHOL, HDL, LDLCALC, TRIG, CHOLHDL, LDLDIRECT in the last 72 hours. Thyroid Function Tests: No results for input(s): TSH, T4TOTAL, FREET4, T3FREE, THYROIDAB in the last 72 hours. Anemia Panel: No  results for input(s): VITAMINB12, FOLATE, FERRITIN, TIBC, IRON, RETICCTPCT in the last 72 hours. Sepsis Labs: No results for input(s): PROCALCITON, LATICACIDVEN in the last 168 hours.  Recent Results (from the past 240 hour(s))  Blood culture (routine x 2)     Status: None (Preliminary result)   Collection Time: 09/16/19 12:47 PM   Specimen: BLOOD  Result Value Ref Range Status   Specimen Description BLOOD BLOOD LEFT HAND  Final   Special Requests   Final    BOTTLES DRAWN AEROBIC AND ANAEROBIC Blood Culture adequate volume   Culture   Final    NO GROWTH 3 DAYS Performed at [facility], [address], [place], [place] [address]    Report Status PENDING  Incomplete  Blood culture (routine x 2)     Status: None (Preliminary result)   Collection Time: 09/16/19 12:47 PM   Specimen: BLOOD  Result Value Ref Range Status   Specimen Description BLOOD BLOOD RIGHT HAND  Final   Special Requests   Final    BOTTLES DRAWN AEROBIC AND ANAEROBIC Blood Culture adequate volume   Culture   Final    NO GROWTH  3 DAYS Performed at [facility], [address] [place], [place] [address]    Report Status PENDING  Incomplete  SARS CORONAVIRUS 2 (TAT 6-24 HRS) Nasopharyngeal Nasopharyngeal Swab     Status: None   Collection Time: 09/16/19 12:47 PM   Specimen: Nasopharyngeal Swab  Result Value Ref Range Status   SARS Coronavirus 2 NEGATIVE NEGATIVE Final    Comment: (NOTE) SARS-CoV-2 target nucleic acids are NOT DETECTED. The SARS-CoV-2 RNA is generally detectable in upper and lower respiratory specimens during the acute phase of infection. Negative results do not preclude SARS-CoV-2 infection, do not rule out co-infections with other pathogens, and should not be used as the sole basis for treatment or other patient management decisions. Negative results must be combined with clinical observations, patient history, and epidemiological information. The expected result is  Negative. Fact Sheet for Patients: SugarRoll.be Fact Sheet for Healthcare Providers: https://www.woods-mathews.com/ This test is not yet approved or cleared by the [place] FDA and  has been authorized for detection and/or diagnosis of SARS-CoV-2 by FDA under an Emergency Use Authorization (EUA). This EUA will remain  in effect (meaning this test can be used) for the duration of the COVID-19 declaration under Section 56 4(b)(1) of the Act, 21 U.S.C. section 360bbb-3(b)(1), unless the authorization is terminated or revoked sooner. Performed at [facility], [place] [address], [place], [place] [address]   MRSA PCR Screening     Status: None   Collection Time: 09/16/19  2:30 PM   Specimen: Nasopharyngeal  Result Value Ref Range Status   MRSA by PCR NEGATIVE NEGATIVE Final    Comment:        The GeneXpert MRSA Assay (FDA approved for NASAL specimens only), is one component of a comprehensive MRSA colonization surveillance program. It is not intended to diagnose MRSA infection nor to guide or monitor treatment for MRSA infections. Performed at [facility], [address] [place], [place] [address]          Radiology Studies: No results found.      Scheduled Meds:  acidophilus  1 capsule Oral Daily   citalopram  10 mg Oral Daily   collagenase   Topical Daily   enoxaparin (LOVENOX) injection  40 mg Subcutaneous Q24H   guaiFENesin  600 mg Oral Q12H   tamsulosin  0.4 mg Oral QPM   Continuous Infusions:  ceFEPime (MAXIPIME) IV 2 g (09/19/19 1105)   sodium chloride       LOS: 3 days    Time spent: 25-30 min    Ezekiel Slocumb, DO [facility] Pager: [phone]  If 7PM-7AM, please contact night-coverage [email or web address] Password [facility] 09/19/2019, 2:59 PM

## 2019-09-20 DIAGNOSIS — E861 Hypovolemia: Secondary | ICD-10-CM

## 2019-09-20 DIAGNOSIS — R1313 Dysphagia, pharyngeal phase: Secondary | ICD-10-CM

## 2019-09-20 DIAGNOSIS — Z515 Encounter for palliative care: Secondary | ICD-10-CM

## 2019-09-20 DIAGNOSIS — Z7189 Other specified counseling: Secondary | ICD-10-CM

## 2019-09-20 DIAGNOSIS — R0902 Hypoxemia: Secondary | ICD-10-CM

## 2019-09-20 DIAGNOSIS — I9589 Other hypotension: Secondary | ICD-10-CM

## 2019-09-20 LAB — BASIC METABOLIC PANEL
Anion gap: 8 (ref 5–15)
BUN: 10 mg/dL (ref 6–20)
CO2: 28 mmol/L (ref 22–32)
Calcium: 8.1 mg/dL — ABNORMAL LOW (ref 8.9–10.3)
Chloride: 103 mmol/L (ref 98–111)
Creatinine, Ser: 0.81 mg/dL (ref 0.61–1.24)
GFR calc Af Amer: >60 mL/min (ref >60)
GFR calc non Af Amer: >60 mL/min (ref >60)
Glucose, Bld: 93 mg/dL (ref 70–99)
Potassium: 3.3 mmol/L — ABNORMAL LOW (ref 3.5–5.1)
Sodium: 139 mmol/L (ref 135–145)

## 2019-09-20 LAB — CBC
HCT: 24.7 % — ABNORMAL LOW (ref 39.0–52.0)
Hemoglobin: 7.6 g/dL — ABNORMAL LOW (ref 13.0–17.0)
MCH: 29.2 pg (ref 26.0–34.0)
MCHC: 30.8 g/dL (ref 30.0–36.0)
MCV: 95 fL (ref 80.0–100.0)
Platelets: 427 10*3/uL — ABNORMAL HIGH (ref 150–400)
RBC: 2.6 MIL/uL — ABNORMAL LOW (ref 4.22–5.81)
RDW: 15.9 % — ABNORMAL HIGH (ref 11.5–15.5)
WBC: 11.5 10*3/uL — ABNORMAL HIGH (ref 4.0–10.5)
nRBC: 0.4 % — ABNORMAL HIGH (ref 0.0–0.2)

## 2019-09-20 LAB — MAGNESIUM
Magnesium: 2 mg/dL (ref 1.7–2.4)
Magnesium: 1.9 mg/dL (ref 1.7–2.4)

## 2019-09-20 MED ORDER — POTASSIUM CHLORIDE 20 MEQ PO PACK
40.0000 meq | PACK | Freq: Once | ORAL | Status: AC
  Administered 2019-09-20: 40 meq via ORAL
  Filled 2019-09-20: qty 2

## 2019-09-20 MED ORDER — ENOXAPARIN SODIUM 30 MG/0.3ML ~~LOC~~ SOLN
30.0000 mg | SUBCUTANEOUS | Status: DC
  Administered 2019-09-20 – 2019-09-21 (×2): 30 mg via SUBCUTANEOUS
  Filled 2019-09-20 (×2): qty 0.3

## 2019-09-20 MED ORDER — SODIUM CHLORIDE 0.9 % IV BOLUS
500.0000 mL | Freq: Once | INTRAVENOUS | Status: AC
  Administered 2019-09-20: 500 mL via INTRAVENOUS

## 2019-09-20 MED ORDER — SODIUM CHLORIDE 0.9 % IV SOLN
INTRAVENOUS | Status: AC
  Administered 2019-09-20 (×2): via INTRAVENOUS

## 2019-09-20 NOTE — Progress Notes (Signed)
PT Cancellation Note  Patient Details Name: Joseph Flores MRN: [id number] DOB: Apr 02, 1961   Cancelled Treatment:    Reason Eval/Treat Not Completed: PT screened, no needs identified, will sign off(Chart reviewed for continued attempts at re-evaluation.  Globally hypotensive, scheduled to receive bolus this date.  Additionally, per discussion with LTC facility, patient dep care at baseline, using hoyer lift for all transfers and [facility] for all mobility.  No skilled PT needs identified at this time given baseline status.  Will complete order at this time.  Please re-consult should needs change.)   Debbi Strandberg H. Owens Shark, PT, DPT, NCS 09/20/19, 10:35 AM [phone]

## 2019-09-20 NOTE — Progress Notes (Signed)
   09/20/19 1139  Vitals  BP (!) 81/55 (Fluids/ MD aware)  Notified MD [place] Ped updated, Increase Fluids to 114ml

## 2019-09-20 NOTE — TOC Progression Note (Signed)
Transition of Care [facility]) - Progression Note    Patient Details  Name: Joseph Flores MRN: [id number] Date of Birth: 19-Feb-1961  [facility] of [facility]) CM/SW Contact  Su Hilt, RN Phone Number: 09/20/2019, 1:11 PM  Clinical Narrative:    Spoke with sister Joseph Flores on the phone I explained that the modified Barium swallow will be done as outpatient on 11/23 I explained that I called [facility] and let them know, she said the plan is for him to return to [facility] at [facility]        Expected Discharge Plan and Services                                                 Social Determinants of Health (SDOH) Interventions    Readmission Risk Interventions No flowsheet data found.

## 2019-09-20 NOTE — Progress Notes (Signed)
PROGRESS NOTE    Joseph Flores  IDP:[id number] DOB: Sep 10, 1961 DOA: 09/16/2019  PCP: Dianne Dun, MD    LOS - 4   Brief Narrative:  58 y.o.malewith Past medical history ofDown syndrome, dysphagia.[facility] of breathwhich had been worsening for 2 days, in addition to intermittent fevers over past 6 days.He reportedly became hypoxic yesterday and was placed on oxygen. HC-POA at time of admission reported chronic difficulty with swallowing and modified barium swallow study was scheduled for 11/23. Patient essentially nonverbal, so subjective history limited. Chest xray showed right lower lobe pneumonia. Patient with hypoxia on admission which improved with 2L/min O2 by [place]. He is admitted for further evaluation and management of healthcare-associated vs aspiration pneumonia.  Palliative care consulted for goals of care discussion with patient's sister/HC-POA.    Subjective 11/9: Patient sleeping comfortably, but easily arouses.  Appears in no acute distress.  No acute events reported overnight.  His BP remains low, semi-responsive to IV fluids, but does not hold up well.  Assessment & Plan:   Principal Problem:   HCAP (healthcare-associated pneumonia) Active Problems:   Hypotension   Down's syndrome   Pressure injury of skin   Unstageable pressure ulcer of sacral region ([place])   Dysphagia   Sepsis secondary to pneumonia, present on admission Healthcare-Associated vs Aspiration Pneumonia- by history and given RLL involvement, suspect this is due to aspiration. MRSA screen negative. COVID negative. Chest xray with RLL infiltrate. Met sepsis criteria on admission with leukocytosis and hypotension in setting of PNA. - continue IV Cefepime for now - follow up cultures - speech therapy consulted, recs below -continue maintenanceIV fluids  Hypotension - continues to be low but MAPs maintained.  Continue as needed boluses  and continuous maintenance fluids for now.  Every 4 hours vitals for the next 24 hours.  Dysphagia - chronic. On admission, HC-POA reported thinking about PEG tube, was advised that PEG does not prevent aspiration, patient would remain at risk for aspiration. - speech therapy consulted, recommends: - dysphagia 1 (pureed) diet, nectar thick liquids - palliative consulted for [place] discussion  Hypernatremia -resolved with D5-1/2 NS.  Na 146 x 2 days, likely due to poor PO intake. Recheck BMP in AM.  Acute Kidney Injury -resolved. Present on admission. Baseline Cr about 0.7, On admission was 1.11. Due to sepsis most likely. IV fluids as above and monitor with serial BMP's  Chronic anemia - stable, no evidence of active bleeding. - serial CBC's to monitor  Unstagable Sacral Pressure Ulcer, present on admission - wound care consulted   DVT prophylaxis:Lovenox Code Status: Full Code Family Communication:sister, [name] updated by phone 11/8 afternoon Disposition Plan:Pending clinical improvement. Return to SNF.  Consultants:  none  Procedures:  none  Antimicrobials:  Cefepime, start 11/5, planned stop 11/11   Objective: Vitals:   09/19/19 1618 09/19/19 2031 09/19/19 2033 09/20/19 0439  BP: (!) 86/75 (!) 78/66  101/68  Pulse: 90 (!) 110 (!) 106 90  Resp: 17   18  Temp:  99.5 F (37.5 C)  98.9 F (37.2 C)  TempSrc:  Axillary    SpO2: 100% (!) 78% 97% 98%  Weight:      Height:        Intake/Output Summary (Last 24 hours) at 09/20/2019 0847 Last data filed at 09/19/2019 2246 Gross per 24 hour  Intake 100 ml  Output 1300 ml  Net -1200 ml   Filed Weights   09/16/19 1237 09/17/19 0103  Weight: 54.4 kg 56.3 kg    Examination:  General exam: resting comfortably, no acute distress HEENT: atraumatic, normocephalic, moist mucus membranes Respiratory system: diminished at right base, otherwise clear to auscultation with wheezes,  rales or rhonchi, normal respiratory effort. Cardiovascular system: normal S1/S2, RRR, no JVD, murmurs, rubs, gallops, no pedal edema.   Gastrointestinal system: soft, non-tender, non-distended abdomen, no organomegaly or masses felt, normal bowel sounds. Extremities: no edema, no cyanosis  Skin: dry, intact, normal temperature     Data Reviewed: I have personally reviewed following labs and imaging studies  CBC: Recent Labs  Lab 09/16/19 1030 09/17/19 0510 09/18/19 0458 09/19/19 0552 09/20/19 0426  WBC 13.7* 13.7* 9.4 9.7 11.5*  NEUTROABS  --   --  6.4 6.0  --   HGB 8.7* 8.2* 8.3* 8.1* 7.6*  HCT 28.6* 26.0* 26.6* 26.4* 24.7*  MCV 96.9 93.5 93.7 93.6 95.0  PLT 394 395 449* 410* 098*   Basic Metabolic Panel: Recent Labs  Lab 09/16/19 1030 09/17/19 0510 09/18/19 0458 09/19/19 0552 09/20/19 0426  NA 145 146* 146* 143 139  K 3.4* 3.3* 3.5 3.0* 3.3*  CL 102 108 105 102 103  CO2 31 29 29 30 28   GLUCOSE 216* 96 91 109* 93  BUN 19 17 17 15 10   CREATININE 1.11 0.79 0.86 0.83 0.81  CALCIUM 8.4* 8.3* 8.5* 8.1* 8.1*  MG  --   --   --   --  2.0   GFR: Estimated Creatinine Clearance: 80.1 mL/min (by C-G formula based on SCr of 0.81 mg/dL). Liver Function Tests: Recent Labs  Lab 09/16/19 1030 09/17/19 0510  AST 33 20  ALT 16 15  ALKPHOS 55 47  BILITOT 0.4 0.6  PROT 7.0 6.5  ALBUMIN 2.3* 2.2*   No results for input(s): LIPASE, AMYLASE in the last 168 hours. No results for input(s): AMMONIA in the last 168 hours. Coagulation Profile: No results for input(s): INR, PROTIME in the last 168 hours. Cardiac Enzymes: No results for input(s): CKTOTAL, CKMB, CKMBINDEX, TROPONINI in the last 168 hours. BNP (last 3 results) No results for input(s): PROBNP in the last 8760 hours. HbA1C: No results for input(s): HGBA1C in the last 72 hours. CBG: No results for input(s): GLUCAP in the last 168 hours. Lipid Profile: No results for input(s): CHOL, HDL, LDLCALC, TRIG, CHOLHDL,  LDLDIRECT in the last 72 hours. Thyroid Function Tests: No results for input(s): TSH, T4TOTAL, FREET4, T3FREE, THYROIDAB in the last 72 hours. Anemia Panel: No results for input(s): VITAMINB12, FOLATE, FERRITIN, TIBC, IRON, RETICCTPCT in the last 72 hours. Sepsis Labs: No results for input(s): PROCALCITON, LATICACIDVEN in the last 168 hours.  Recent Results (from the past 240 hour(s))  Blood culture (routine x 2)     Status: None (Preliminary result)   Collection Time: 09/16/19 12:47 PM   Specimen: BLOOD  Result Value Ref Range Status   Specimen Description BLOOD BLOOD LEFT HAND  Final   Special Requests   Final    BOTTLES DRAWN AEROBIC AND ANAEROBIC Blood Culture adequate volume   Culture   Final    NO GROWTH 4 DAYS Performed at [facility], [address] [place], [place] [address]    Report Status PENDING  Incomplete  Blood culture (routine x 2)     Status: None (Preliminary result)   Collection Time: 09/16/19 12:47 PM   Specimen: BLOOD  Result Value Ref Range Status   Specimen Description BLOOD BLOOD RIGHT HAND  Final   Special  Requests   Final    BOTTLES DRAWN AEROBIC AND ANAEROBIC Blood Culture adequate volume   Culture   Final    NO GROWTH 4 DAYS Performed at [facility], [address] [place], [place] [address]    Report Status PENDING  Incomplete  SARS CORONAVIRUS 2 (TAT 6-24 HRS) Nasopharyngeal Nasopharyngeal Swab     Status: None   Collection Time: 09/16/19 12:47 PM   Specimen: Nasopharyngeal Swab  Result Value Ref Range Status   SARS Coronavirus 2 NEGATIVE NEGATIVE Final    Comment: (NOTE) SARS-CoV-2 target nucleic acids are NOT DETECTED. The SARS-CoV-2 RNA is generally detectable in upper and lower respiratory specimens during the acute phase of infection. Negative results do not preclude SARS-CoV-2 infection, do not rule out co-infections with other pathogens, and should not be used as the sole basis for treatment or other patient  management decisions. Negative results must be combined with clinical observations, patient history, and epidemiological information. The expected result is Negative. Fact Sheet for Patients: SugarRoll.be Fact Sheet for Healthcare Providers: https://www.woods-mathews.com/ This test is not yet approved or cleared by the [place] FDA and  has been authorized for detection and/or diagnosis of SARS-CoV-2 by FDA under an Emergency Use Authorization (EUA). This EUA will remain  in effect (meaning this test can be used) for the duration of the COVID-19 declaration under Section 56 4(b)(1) of the Act, 21 U.S.C. section 360bbb-3(b)(1), unless the authorization is terminated or revoked sooner. Performed at [facility], [place] [address], [place], [place] [address]   MRSA PCR Screening     Status: None   Collection Time: 09/16/19  2:30 PM   Specimen: Nasopharyngeal  Result Value Ref Range Status   MRSA by PCR NEGATIVE NEGATIVE Final    Comment:        The GeneXpert MRSA Assay (FDA approved for NASAL specimens only), is one component of a comprehensive MRSA colonization surveillance program. It is not intended to diagnose MRSA infection nor to guide or monitor treatment for MRSA infections. Performed at [facility], [address] [place], [place] [address]          Radiology Studies: No results found.      Scheduled Meds: . acidophilus  1 capsule Oral Daily  . citalopram  10 mg Oral Daily  . collagenase   Topical Daily  . enoxaparin (LOVENOX) injection  40 mg Subcutaneous Q24H  . guaiFENesin  600 mg Oral Q12H  . potassium chloride  40 mEq Oral Once  . tamsulosin  0.4 mg Oral QPM   Continuous Infusions: . ceFEPime (MAXIPIME) IV 2 g (09/20/19 0536)     LOS: 4 days    Time spent: 25-30 min    Ezekiel Slocumb, DO [facility] Pager: [phone]  If 7PM-7AM, please contact night-coverage  [email or web address] Password [facility] 09/20/2019, 8:47 AM

## 2019-09-20 NOTE — Consult Note (Signed)
Pharmacy Antibiotic Note  Joseph Flores is a 58 y.o. male admitted on 09/16/2019 with pneumonia. Patient with Down syndrome presenting to ED from [facility] c/o worsening SOB. Recent diagnosis of PNA on CXR yesterday. WBC 13.7. 11/5 CXR impression of RLL PNA. He was previously admitted at [facility] from 10/8-10/13 with acute respiratory failure secondary to sepsis and aspiration pneumonia. Intubated 10/8-10/10. Pharmacy has been consulted for cefepime dosing.   Plan: Day 5 - Cefepime 2 g IV q8h     Height: _0  (162.6 cm) Weight: 124 lb 1.9 oz (56.3 kg) IBW/kg (Calculated) : 59.2  Temp (24hrs), Avg:99.1 F (37.3 C), Min:98.8 F (37.1 C), Max:99.5 F (37.5 C)  Recent Labs  Lab 09/16/19 1030 09/17/19 0510 09/18/19 0458 09/19/19 0552 09/20/19 0426  WBC 13.7* 13.7* 9.4 9.7 11.5*  CREATININE 1.11 0.79 0.86 0.83 0.81    Estimated Creatinine Clearance: 80.1 mL/min (by C-G formula based on SCr of 0.81 mg/dL).    No Known Allergies  Antimicrobials this admission: Vancomycin 1 g x 1 in ED on 11/5 Cefepime 11/5 >>   Antimicrobials admission 10/8-10/13 : Vancomy1 Clindamycin cin 10/8 x 10/8 x1 Azithromycin 10/8-10/12 Cefepime 10/8-10/13  Dose adjustments this admission: 11/6 Adjust Cefepime 2 g IV q12h to q8h  Microbiology results: 11/5 BCx: NG x4 11/5 Sputum: ordered 11/5 MRSA PCR: negative  11/5 COVID-19: negative 10/8 BAL (past admission): normal respiratory flora, rare GPC in pairs on gram strain  Thank you for allowing pharmacy to be a part of this patient's care.  Chinita Greenland PharmD Clinical Pharmacist 09/20/2019

## 2019-09-20 NOTE — TOC Progression Note (Signed)
Transition of Care [facility]) - Progression Note    Patient Details  Name: Joseph Flores MRN: [id number] Date of Birth: [birth year]  Transition of Care [facility]) CM/SW [place], RN Phone Number: 09/20/2019, 1:09 PM  Clinical Narrative:    Spoke with the nurse at [facility], She stated that yes the patient will come back to them at DC, His baseline is to chair with hoyer lift and her does not work with pt, she asked about the barium swallow test, I explained that they did a bedside swallow eval and put him on a dysphagia diet and the modified barium swallow will be done 11/23 as outpatient, she stated understadning        Expected Discharge Plan and Services                                                 Social Determinants of Health (SDOH) Interventions    Readmission Risk Interventions No flowsheet data found.

## 2019-09-20 NOTE — Consult Note (Signed)
Consultation Note Date: 09/20/2019   Patient Name: Joseph Flores  DOB: 1961/05/11  MRN: [id number]  Age / Sex: 58 y.o., male  PCP: Dianne Dun, MD Referring Physician: Ezekiel Slocumb, DO  Reason for Consultation: Establishing goals of care  HPI/Patient Profile: 58 y.o. male  with past medical history of Down syndrome, dysphagia, arthritis admitted on 09/16/2019 with cough, shortness of breath, and fevers. Reportedly because hypoxic and placed on oxygen at SNF. Family reports difficulty swallowing and patient was scheduled for MBS on 11/23. Chest xray revealed right lower lobe pneumonia. Admitted for further management of healthcare-associated vs. Aspiration pneumonia. Palliative medicine consultation for goals of care.   Clinical Assessment and Goals of Care:  I have reviewed medical records, discussed with care team, and assessed the patient at bedside. Patient baseline nonverbal. He opens eyes to voice. He does not appear to be in pain or discomfort. No family at bedside.   This afternoon, spoke with patient's sister who is documented legal guardian Joseph Flores) via telephone.   Introduced Palliative Medicine as specialized medical care for people living with serious illness. It focuses on providing relief from the symptoms and stress of a serious illness. The goal is to improve quality of life for both the patient and the family.  We discussed a brief life review of the patient. Joseph Flores reports patient has been living at [facility] for about one year. She shares that he has lost weight since the pandemic because her and sisters cannot visit and assist with feeding. Before the pandemic, a family member would go daily to assist with lunch and dinner. He also was verbally communicative prior to recent hospitalization for pneumonia. Since then, he will only speak few words. He does respond better with  family members and Joseph Flores reports he recognizes her when she visits bedside.   Discussed events leading up to admission and course of hospitalization including diagnoses, interventions, plan of care.   I attempted to elicit values and goals of care important to the patient and family. Advanced directives, concepts specific to code status, artifical feeding and hydration were discussed. Joseph Flores confirms her conversation with Dr. Arbutus Ped this weekend and decision to make Joseph Flores a DNR, speaking of the concern that we would break his ribs. Frankly and compassionately agreed with this decision, explaining aggressive nature of CPR and with his underlying frailty and chronic conditions, this causing more pain/suffering at EOL. Joseph Flores states "I don't want him to suffer."   Introduced MOST form and encouraged Joseph Flores to meet with me in person to consider completing this documentation, allowing [place] to document limitations to care. Joseph Flores plans to meet with PMT provider tomorrow at 12pm.   Spent time discussing Avan poor oral intake, dysphagia, and aspiration risk. Medically recommended against a feeding tube with known risk to continue aspirating. Also discussed quality of life. Educated on aspiration precautions.   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   Palliative Care services outpatient were explained and offered. Joseph Flores agreeable. Questions and concerns were addressed.    SUMMARY OF RECOMMENDATIONS    Patient has documented legal guardian, his sister Joseph Flores.   Continue current plan of care and medical management per attending. DNR code status after attending discussion with sister over the weekend.  PMT provider to meet with Joseph Flores in person tomorrow, 11/10 at 12pm.  Will plan to discuss MOST form and consideration of limitations to care with the patient's underlying chronic conditions, ongoing dysphagia/aspiration risk, and frailty.  Outpatient palliative referral at SNF.   Code Status/Advance Care Planning:  DNR  Symptom Management:   Per attending  Palliative Prophylaxis:   Aspiration, Bowel Regimen, Delirium Protocol, Frequent Pain Assessment, Oral Care and Turn Reposition  Psycho-social/Spiritual:   Desire for further Chaplaincy support: yes  Additional Recommendations: Caregiving  Support/Resources, Compassionate Wean Education and Education on Hospice  Prognosis:   Unable to determine  Discharge Planning: To Be Determined      Primary Diagnoses: Present on Admission:  HCAP (healthcare-associated pneumonia)  Pressure injury of skin  Unstageable pressure ulcer of sacral region ([place])  Hypotension   I have reviewed the medical record, interviewed the patient and family, and examined the patient. The following aspects are pertinent.  Past Medical History:  Diagnosis Date   Arthritis    Degeneration of intervertebral disc of lumbar region    Down's syndrome    Social History   Socioeconomic History   Marital status: Single    Spouse name: Not on file   Number of children: 0   Years of education: 12   Highest education level: Not on file  Occupational History   Occupation: Disabled  Scientist, product/process development strain: Not hard at all   Food insecurity    Worry: Never true    Inability: Never true   Transportation needs    Medical: Yes    Non-medical: Yes  Tobacco Use   Smoking status: Never Smoker   Smokeless tobacco: Never Used  Substance and Sexual Activity   Alcohol use: No   Drug use: No   Sexual activity: Never  Lifestyle   Physical activity    Days per week: 0 days    Minutes per session: 0 min   Stress: Not at all  Relationships   Social connections    Talks on  phone: Patient refused    Gets together: Patient refused    Attends religious service: Patient refused    Active member of club or organization: Patient refused    Attends meetings of clubs or organizations: Patient refused    Relationship status: Patient refused  Other Topics Concern   Not on file  Social History Narrative   Sister transports to all appointments. Lives with sister who provided all care to pt   Family History  Problem Relation Age of Onset   Kidney disease Mother        Had part of rib and kidney removed   Cancer Mother    Hypertension Mother    Hypertension Father    Healthy Sister    Cancer Brother    Healthy Sister    Healthy Sister    Rheum arthritis Sister    Scheduled Meds:  acidophilus  1 capsule Oral Daily   citalopram  10 mg Oral Daily   collagenase   Topical Daily  enoxaparin (LOVENOX) injection  40 mg Subcutaneous Q24H   guaiFENesin  600 mg Oral Q12H   potassium chloride  40 mEq Oral Once   tamsulosin  0.4 mg Oral QPM   Continuous Infusions:  ceFEPime (MAXIPIME) IV 2 g (09/20/19 0536)   PRN Meds:.acetaminophen, ipratropium-albuterol Medications Prior to Admission:  Prior to Admission medications   Medication Sig Start Date End Date Taking? Authorizing Provider  acetaminophen (TYLENOL) 650 MG suppository Place 650 mg rectally every 6 (six) hours as needed for fever.   Yes [provider]  acidophilus (RISAQUAD) CAPS capsule Take 1 capsule by mouth daily.   Yes [provider]  bisacodyl (DULCOLAX) 5 MG EC tablet Take 1 tablet (5 mg total) by mouth daily as needed for moderate constipation. 08/24/19  Yes Vaughan Basta, MD  cefTRIAXone (ROCEPHIN) IVPB Inject 1 g into the vein daily. 09/15/19 09/20/19 Yes [provider]  citalopram (CELEXA) 10 MG tablet Take 10 mg by mouth daily. 08/19/19  Yes [provider]  clindamycin (CLEOCIN) 300 MG/50ML IVPB Inject 300 mg into the vein daily.  09/15/19 09/20/19 Yes [provider]  guaiFENesin (MUCINEX) 600 MG 12 hr tablet Take 600 mg by mouth every 12 (twelve) hours.   Yes [provider]  ipratropium-albuterol (DUONEB) 0.5-2.5 (3) MG/3ML SOLN Take 3 mLs by nebulization every 6 (six) hours as needed (cough).   Yes [provider]  predniSONE (DELTASONE) 20 MG tablet Take 10-40 mg by mouth See admin instructions. Take 2 tablets (40mg ) by mouth daily for 3 days, 1 tablets (30mg ) by mouth daily for 3 days, 1 tablet (20mg ) by mouth daily for 3 days then take  tablet (10mg ) by mouth daily for 3 days 09/15/19 09/27/19 Yes [provider]  tamsulosin (FLOMAX) 0.4 MG CAPS capsule Take 0.4 mg by mouth every evening.  08/19/19  Yes [provider]  Vitamin D, Ergocalciferol, (DRISDOL) 50000 units CAPS capsule Take 1 capsule (50,000 Units total) by mouth once a week. For 12 weeks Patient taking differently: Take 50,000 Units by mouth every Monday.  05/23/17  Yes Roselee Nova, MD   No Known Allergies Review of Systems  Unable to perform ROS: Other   Physical Exam Vitals signs and nursing note reviewed.  Constitutional:      Appearance: He is ill-appearing.  HENT:     Head: Normocephalic and atraumatic.  Cardiovascular:     Rate and Rhythm: Normal rate.  Pulmonary:     Effort: No tachypnea, accessory muscle usage or respiratory distress.  Abdominal:     Tenderness: There is no abdominal tenderness.  Skin:    General: Skin is warm and dry.  Neurological:     Mental Status: He is easily aroused.     Comments: Opens eyes to voice. Baseline nonverbal  Psychiatric:        Attention and Perception: He is inattentive.        Speech: He is noncommunicative.    Vital Signs: BP 101/68 (BP Location: Left Arm)    Pulse 90    Temp 98.9 F (37.2 C)    Resp 18    Ht 5\' 4"  (1.626 m)    Wt 56.3 kg    SpO2 98%    BMI 21.30 kg/m  Pain Scale: Faces   Pain Score: Asleep   SpO2: SpO2: 98 % O2  Device:SpO2: 98 % O2 Flow Rate: .O2 Flow Rate (L/min): 2 L/min  IO: Intake/output summary:   Intake/Output Summary (Last 24 hours)  at 09/20/2019 0964 Last data filed at 09/19/2019 2246 Gross per 24 hour  Intake 100 ml  Output 1300 ml  Net -1200 ml    LBM: Last BM Date: 09/18/19 Baseline Weight: Weight: 54.4 kg Most recent weight: Weight: 56.3 kg     Palliative Assessment/Data: PPS 40%   Flowsheet Rows     Most Recent Value  Intake Tab  Referral Department  Hospitalist  Unit at Time of Referral  ER  Date Notified  09/17/19  Palliative Care Type  Return patient Palliative Care  Reason for referral  Clarify Goals of Care  Date of Admission  09/16/19  # of days IP prior to Palliative referral  1  Clinical Assessment  Psychosocial & Spiritual Assessment  Palliative Care Outcomes      Time In/Out: 0945-1000, 3838-1840 Time Total: 60 Greater than 50%  of this time was spent counseling and coordinating care related to the above assessment and plan.  Signed by:  Ihor Dow, DNP, FNP-C Palliative Medicine Team  Phone: [phone] Fax: [phone]   Please contact Palliative Medicine Team phone at [phone] for questions and concerns.  For individual provider: See Shea Evans

## 2019-09-20 NOTE — Progress Notes (Signed)
Called [phone] to attempt to change barium swallow to earlier date. Currently scheduled Nov 23

## 2019-09-20 NOTE — Care Management Important Message (Signed)
Important Message  Patient Details  Name: Joseph Flores MRN: [id number] Date of Birth: 10-02-1961   Medicare Important Message Given:  Yes     Juliann Pulse A Carnell Casamento 09/20/2019, 11:08 AM

## 2019-09-20 NOTE — Progress Notes (Signed)
   09/20/19 1000  Vitals  BP (!) 82/61   notified MD griffith. Verbal to give NS 559ml bolos then 137ml hour

## 2019-09-20 NOTE — Progress Notes (Signed)
Tele called at 0941 to report a 3.15sec pause at 0507 and HR 50  With 2 degrees heart block at 0544. Currently pt is SR 90's. notified MD Arbutus Ped at [phone]

## 2019-09-20 NOTE — Progress Notes (Signed)
Anticoagulation monitoring(Lovenox):  58yo  M ordered Lovenox 40 mg Q24h  Filed Weights   09/16/19 1237 09/17/19 0103  Weight: 120 lb (54.4 kg) 124 lb 1.9 oz (56.3 kg)   BMI 21.3   Lab Results  Component Value Date   CREATININE 0.81 09/20/2019   CREATININE 0.83 09/19/2019   CREATININE 0.86 09/18/2019   Estimated Creatinine Clearance: 80.1 mL/min (by C-G formula based on SCr of 0.81 mg/dL). Hemoglobin & Hematocrit     Component Value Date/Time   HGB 7.6 (L) 09/20/2019 0426   HGB 11.1 (L) 08/26/2014 2020   HCT 24.7 (L) 09/20/2019 0426   HCT 35.0 (L) 08/26/2014 2020     Per Protocol for Patient with estCrcl > 30 ml/min and weight < 57 kg in Male pt,  will transition to Lovenox 30 mg Q24h      Chinita Greenland PharmD Clinical Pharmacist 09/20/2019

## 2019-09-21 DIAGNOSIS — I959 Hypotension, unspecified: Secondary | ICD-10-CM

## 2019-09-21 LAB — CBC WITH DIFFERENTIAL/PLATELET
Abs Immature Granulocytes: 1.8 10*3/uL — ABNORMAL HIGH (ref 0.00–0.07)
Basophils Absolute: 0.1 10*3/uL (ref 0.0–0.1)
Basophils Relative: 1 %
Eosinophils Absolute: 0.1 10*3/uL (ref 0.0–0.5)
Eosinophils Relative: 0 %
HCT: 25 % — ABNORMAL LOW (ref 39.0–52.0)
Hemoglobin: 7.6 g/dL — ABNORMAL LOW (ref 13.0–17.0)
Immature Granulocytes: 16 %
Lymphocytes Relative: 14 %
Lymphs Abs: 1.6 10*3/uL (ref 0.7–4.0)
MCH: 28.6 pg (ref 26.0–34.0)
MCHC: 30.4 g/dL (ref 30.0–36.0)
MCV: 94 fL (ref 80.0–100.0)
Monocytes Absolute: 0.9 10*3/uL (ref 0.1–1.0)
Monocytes Relative: 8 %
Neutro Abs: 6.9 10*3/uL (ref 1.7–7.7)
Neutrophils Relative %: 61 %
Platelets: 454 10*3/uL — ABNORMAL HIGH (ref 150–400)
RBC: 2.66 MIL/uL — ABNORMAL LOW (ref 4.22–5.81)
RDW: 15.9 % — ABNORMAL HIGH (ref 11.5–15.5)
Smear Review: NORMAL
WBC: 11.3 10*3/uL — ABNORMAL HIGH (ref 4.0–10.5)
nRBC: 0.3 % — ABNORMAL HIGH (ref 0.0–0.2)

## 2019-09-21 LAB — BASIC METABOLIC PANEL
Anion gap: 9 (ref 5–15)
BUN: 11 mg/dL (ref 6–20)
CO2: 24 mmol/L (ref 22–32)
Calcium: 7.8 mg/dL — ABNORMAL LOW (ref 8.9–10.3)
Chloride: 105 mmol/L (ref 98–111)
Creatinine, Ser: 0.8 mg/dL (ref 0.61–1.24)
GFR calc Af Amer: >60 mL/min (ref >60)
GFR calc non Af Amer: >60 mL/min (ref >60)
Glucose, Bld: 84 mg/dL (ref 70–99)
Potassium: 3.5 mmol/L (ref 3.5–5.1)
Sodium: 138 mmol/L (ref 135–145)

## 2019-09-21 LAB — PROCALCITONIN: Procalcitonin: <0.1 ng/mL

## 2019-09-21 LAB — CULTURE, BLOOD (ROUTINE X 2)
Culture: NO GROWTH
Culture: NO GROWTH
Special Requests: ADEQUATE
Special Requests: ADEQUATE

## 2019-09-21 MED ORDER — CHLORHEXIDINE GLUCONATE CLOTH 2 % EX PADS
6.0000 | MEDICATED_PAD | Freq: Every day | CUTANEOUS | Status: DC
  Administered 2019-09-21 – 2019-09-29 (×9): 6 via TOPICAL

## 2019-09-21 NOTE — Progress Notes (Addendum)
PROGRESS NOTE    Joseph Flores  BZJ:[id number] DOB: 06/07/1961 DOA: 09/16/2019  PCP: Dianne Dun, MD    LOS - 5   Brief Narrative:  58 y.o.malewith Past medical history ofDown syndrome, dysphagia.[facility] and shortness of breathwhich had been worsening for 2 days, in addition to intermittent fevers over past 6 days.He reportedly became hypoxic yesterday and was placed on oxygen. HC-POA at time of admission reported chronic difficulty with swallowing and modified barium swallow study was scheduled for 11/23. Patient essentially nonverbal, so subjective history limited. Chest xray showed right lower lobe pneumonia. Patient with hypoxia on admission which improved with 2L/min O2 by [place]. He is admitted for further evaluation and management of healthcare-associated vs aspiration pneumonia.  Palliative care consulted for goals of care discussion with patient's sister/HC-POA.  Patient to continue with palliative services at [facility] after discharge.  Subjective 11/10: Patient awake lying in bed.  Appears in no acute distress.  No acute events reported overnight.  As per usual, patient wincing during physical exam.  Assessment & Plan:   Principal Problem:   HCAP (healthcare-associated pneumonia) Active Problems:   Hypotension   Down's syndrome   Pressure injury of skin   Unstageable pressure ulcer of sacral region ([place])   Dysphagia   Hypoxia   Sepsis secondary to pneumonia, present on admission Healthcare-Associated vs Aspiration Pneumonia- by history and given RLL involvement, suspect this is due to aspiration. MRSA screen negative. COVID negative. Chest xray with RLL infiltrate. Met sepsis criteria on admission with leukocytosis and hypotension in setting of PNA. - continue IV Cefepime for now - blood cultures negative - speech therapy consulted, recs below -continue IV fluids for maintenance any BP support - still hypotensive now 5 days  on antibiotic - check procalcitonin and cortisol in AM  Hypotension- continues to be low but MAPs maintained. Continue as needed boluses if MAP < 65.  Stopped continuous fluids this afternoon to see how his BP holds.  Per chart review, patient baseline BP appears normal (017'P systolic).  - check cortisol in AM - consider ACTH stim test if needed  Dysphagia - chronic. On admission, HC-POA reported thinking about PEG tube, was advised that PEG does not prevent aspiration, patient would remain at risk for aspiration. - speech therapy consulted, recommends: - dysphagia 1 (pureed) diet, nectar thick liquids - palliative consulted for [place] discussion  Hypernatremia -resolved with D5-1/2 NS.Na 146 x 2 days, likely due to poor PO intake. Recheck BMP in AM.  Acute Kidney Injury -resolved. Present on admission. Baseline Cr about 0.7, On admission was 1.11. Due to sepsis most likely. IV fluids as above and monitor with serial BMP's  Chronic anemia - stable, no evidence of active bleeding. - serial CBC's to monitor  Unstagable Sacral Pressure Ulcer, present on admission - wound care consulted  Down's syndrome - patient bed/wheelchair bound, lives at [facility].     DVT prophylaxis:Lovenox Code Status: DNR Family Communication:sister, [name]  Disposition Plan:Pending clinical improvement (BP stabilization). Return to SNF.  Social: Palliative met with patient's sister/guardian, his POA.  See palliative note of 11/10 for details.  Consultants:  none  Procedures:  none  Antimicrobials:  Cefepime, start 11/5, planned stop 11/11   Objective: Vitals:   09/21/19 0049 09/21/19 0051 09/21/19 0816 09/21/19 1307  BP: (!) 85/59 (!) 82/63 (!) 83/67 90/64  Pulse: 92 96 82 89  Resp:      Temp:   98.6 F (37 C)   TempSrc:  Axillary   SpO2:   96%   Weight:      Height:        Intake/Output Summary (Last 24 hours) at 09/21/2019 1407 Last data filed  at 09/21/2019 1016 Gross per 24 hour  Intake 2498.08 ml  Output 1700 ml  Net 798.08 ml   Filed Weights   09/16/19 1237 09/17/19 0103  Weight: 54.4 kg 56.3 kg    Examination:  General exam: awake, alert, no acute distress Respiratory system: decreased breath sounds, no wheezes, rales or rhonchi, normal respiratory effort. Cardiovascular system: normal S1/S2, RRR, no JVD, murmurs, rubs, gallops, no pedal edema.   Gastrointestinal system: soft, non-tender, non-distended abdomen. Extremities: no cyanosis, no edema, normal tone Skin: dry, intact, normal temperature   Data Reviewed: I have personally reviewed following labs and imaging studies  CBC: Recent Labs  Lab 09/17/19 0510 09/18/19 0458 09/19/19 0552 09/20/19 0426 09/21/19 0406  WBC 13.7* 9.4 9.7 11.5* 11.3*  NEUTROABS  --  6.4 6.0  --  6.9  HGB 8.2* 8.3* 8.1* 7.6* 7.6*  HCT 26.0* 26.6* 26.4* 24.7* 25.0*  MCV 93.5 93.7 93.6 95.0 94.0  PLT 395 449* 410* 427* 254*   Basic Metabolic Panel: Recent Labs  Lab 09/17/19 0510 09/18/19 0458 09/19/19 0552 09/20/19 0426 09/20/19 1437 09/21/19 0406  NA 146* 146* 143 139  --  138  K 3.3* 3.5 3.0* 3.3*  --  3.5  CL 108 105 102 103  --  105  CO2 29 29 30 28   --  24  GLUCOSE 96 91 109* 93  --  84  BUN 17 17 15 10   --  11  CREATININE 0.79 0.86 0.83 0.81  --  0.80  CALCIUM 8.3* 8.5* 8.1* 8.1*  --  7.8*  MG  --   --   --  2.0 1.9  --    GFR: Estimated Creatinine Clearance: 81.1 mL/min (by C-G formula based on SCr of 0.8 mg/dL). Liver Function Tests: Recent Labs  Lab 09/16/19 1030 09/17/19 0510  AST 33 20  ALT 16 15  ALKPHOS 55 47  BILITOT 0.4 0.6  PROT 7.0 6.5  ALBUMIN 2.3* 2.2*   No results for input(s): LIPASE, AMYLASE in the last 168 hours. No results for input(s): AMMONIA in the last 168 hours. Coagulation Profile: No results for input(s): INR, PROTIME in the last 168 hours. Cardiac Enzymes: No results for input(s): CKTOTAL, CKMB, CKMBINDEX, TROPONINI in  the last 168 hours. BNP (last 3 results) No results for input(s): PROBNP in the last 8760 hours. HbA1C: No results for input(s): HGBA1C in the last 72 hours. CBG: No results for input(s): GLUCAP in the last 168 hours. Lipid Profile: No results for input(s): CHOL, HDL, LDLCALC, TRIG, CHOLHDL, LDLDIRECT in the last 72 hours. Thyroid Function Tests: No results for input(s): TSH, T4TOTAL, FREET4, T3FREE, THYROIDAB in the last 72 hours. Anemia Panel: No results for input(s): VITAMINB12, FOLATE, FERRITIN, TIBC, IRON, RETICCTPCT in the last 72 hours. Sepsis Labs: Recent Labs  Lab 09/21/19 0406  PROCALCITON <0.10    Recent Results (from the past 240 hour(s))  Blood culture (routine x 2)     Status: None   Collection Time: 09/16/19 12:47 PM   Specimen: BLOOD  Result Value Ref Range Status   Specimen Description BLOOD BLOOD LEFT HAND  Final   Special Requests   Final    BOTTLES DRAWN AEROBIC AND ANAEROBIC Blood Culture adequate volume   Culture   Final    NO  GROWTH 5 DAYS Performed at [facility], [address] [place], [place] [address]    Report Status 09/21/2019 FINAL  Final  Blood culture (routine x 2)     Status: None   Collection Time: 09/16/19 12:47 PM   Specimen: BLOOD  Result Value Ref Range Status   Specimen Description BLOOD BLOOD RIGHT HAND  Final   Special Requests   Final    BOTTLES DRAWN AEROBIC AND ANAEROBIC Blood Culture adequate volume   Culture   Final    NO GROWTH 5 DAYS Performed at [facility], [address], [place], [place] [address]    Report Status 09/21/2019 FINAL  Final  SARS CORONAVIRUS 2 (TAT 6-24 HRS) Nasopharyngeal Nasopharyngeal Swab     Status: None   Collection Time: 09/16/19 12:47 PM   Specimen: Nasopharyngeal Swab  Result Value Ref Range Status   SARS Coronavirus 2 NEGATIVE NEGATIVE Final    Comment: (NOTE) SARS-CoV-2 target nucleic acids are NOT DETECTED. The SARS-CoV-2 RNA is generally detectable in upper and  lower respiratory specimens during the acute phase of infection. Negative results do not preclude SARS-CoV-2 infection, do not rule out co-infections with other pathogens, and should not be used as the sole basis for treatment or other patient management decisions. Negative results must be combined with clinical observations, patient history, and epidemiological information. The expected result is Negative. Fact Sheet for Patients: SugarRoll.be Fact Sheet for Healthcare Providers: https://www.woods-mathews.com/ This test is not yet approved or cleared by the [place] FDA and  has been authorized for detection and/or diagnosis of SARS-CoV-2 by FDA under an Emergency Use Authorization (EUA). This EUA will remain  in effect (meaning this test can be used) for the duration of the COVID-19 declaration under Section 56 4(b)(1) of the Act, 21 U.S.C. section 360bbb-3(b)(1), unless the authorization is terminated or revoked sooner. Performed at [facility], [place] [address], [place],  [address]   MRSA PCR Screening     Status: None   Collection Time: 09/16/19  2:30 PM   Specimen: Nasopharyngeal  Result Value Ref Range Status   MRSA by PCR NEGATIVE NEGATIVE Final    Comment:        The GeneXpert MRSA Assay (FDA approved for NASAL specimens only), is one component of a comprehensive MRSA colonization surveillance program. It is not intended to diagnose MRSA infection nor to guide or monitor treatment for MRSA infections. Performed at [facility], [address], [place],  [address]          Radiology Studies: No results found.      Scheduled Meds: . acidophilus  1 capsule Oral Daily  . Chlorhexidine Gluconate Cloth  6 each Topical Daily  . citalopram  10 mg Oral Daily  . collagenase   Topical Daily  . enoxaparin (LOVENOX) injection  30 mg Subcutaneous Q24H  . guaiFENesin  600 mg Oral Q12H  .  tamsulosin  0.4 mg Oral QPM   Continuous Infusions: . ceFEPime (MAXIPIME) IV 2 g (09/21/19 1303)     LOS: 5 days    Time spent: 25 min    Ezekiel Slocumb, DO [facility] Pager: [phone]  If 7PM-7AM, please contact night-coverage [email or web address] Password [email or web address] 09/21/2019, 2:07 PM

## 2019-09-21 NOTE — Progress Notes (Signed)
[place] notified via text that patient has low blood pressure 82/63.

## 2019-09-21 NOTE — Progress Notes (Signed)
attemped to feed pt dinner, pt coughs after each bite. RN stopped feeding.

## 2019-09-21 NOTE — Progress Notes (Signed)
New referral for [facility] community Palliative program to follow at [facility] received from Palliative NP Ihor Dow. CMRN Deliliah Belenda Cruise made aware. Patient information given to referral. Flo Shanks BSN, RN, [place] [phone]

## 2019-09-21 NOTE — TOC Progression Note (Signed)
Transition of Care [facility]) - Progression Note    Patient Details  Name: Joseph Flores MRN: [id number] Date of Birth: 16-Jun-1961  Transition of Care [facility]) CM/SW [place], RN Phone Number: 09/21/2019, 12:11 PM  Clinical Narrative:    Damaris Schooner with Debra the Nurse at [facility], I explained that the Barium swallow is scheduled as outpatient on the 23rd and to cancel the test that they scheduled.  She stated that she was already aware. He is Puree diet and Nectar thick liquid, she said he was Honey thick there at [facility]        Expected Discharge Plan and Services                                                 Social Determinants of Health (SDOH) Interventions    Readmission Risk Interventions No flowsheet data found.

## 2019-09-21 NOTE — Progress Notes (Signed)
Daily Progress Note   Patient Name: Joseph Flores       Date: 09/21/2019 DOB: 1961/03/20  Age: 58 y.o. MRN#: [id number] Attending Physician: Ezekiel Slocumb, DO Primary Care Physician: Dianne Dun, MD Admit Date: 09/16/2019  Reason for Consultation/Follow-up: Establishing goals of care  Subjective: Patient resting during visit. Does not appear to be in pain or discomfort. Declined food for sister at bedside.   GOC: F/u with sister/legal guardian Joseph Flores) at bedside.  Introduced role of palliative medicine.  Discussed events leading up to admission and course of hospitalization including diagnoses, interventions, and plan of care.   Discussed plan for MBS scheduled for 10/04/19 and plan to continue dysphagia diet with nectar thick liquids. Discussed aspiration risk and risks of placing feeding tube including aspiration, infection, dislodgment/removal with altered mental status.  Discussed and completed MOST form with Joseph Flores. She shares her decision for DNR/DNI code status, speaking that she does not want to see Joseph Flores "suffer" and accepting "The Lord's will" when he calls [place] home. At this point, Joseph Flores understands risks but would like for PEG tube to be placed if necessary. She would like for him to be artificially fed with hopes of prolonging life. She is still very saddened with the fact that prior to the pandemic, he had an excellent nutritional status. We discussed recurrent pneumonia leading to overall health decline and deconditioning with underlying Down Syndrome.  Completed MOST form with Joseph Flores. Decisions made for DNR/DNI, limited additional interventions including re-hospitalization if necessary, CPAP/BiPAP if necessary, IVF if indicated, ABX if indicated, and yes to long-term  feeding tube placement. Durable DNR was completed by Dr. Arbutus Ped. Copies of MOST and durable DNR placed in chart and given to Joseph Flores.   Answered questions and concerns. PMT contact information given.  Length of Stay: 5  Current Medications: Scheduled Meds:  . acidophilus  1 capsule Oral Daily  . Chlorhexidine Gluconate Cloth  6 each Topical Daily  . citalopram  10 mg Oral Daily  . collagenase   Topical Daily  . enoxaparin (LOVENOX) injection  30 mg Subcutaneous Q24H  . guaiFENesin  600 mg Oral Q12H  . tamsulosin  0.4 mg Oral QPM    Continuous Infusions: . ceFEPime (MAXIPIME) IV 2 g (09/21/19 1303)    PRN Meds: acetaminophen, ipratropium-albuterol  Physical Exam Vitals signs  and nursing note reviewed.  HENT:     Head: Normocephalic and atraumatic.  Cardiovascular:     Rate and Rhythm: Normal rate.  Pulmonary:     Effort: No tachypnea, accessory muscle usage or respiratory distress.     Comments: Room air Skin:    General: Skin is warm and dry.  Neurological:     Mental Status: He is easily aroused.     Comments: Baseline non-verbal  Psychiatric:        Attention and Perception: He is inattentive.        Speech: He is noncommunicative.            Vital Signs: BP 90/64   Pulse 89   Temp 98.6 F (37 C) (Axillary)   Resp 17   Ht 5\' 4"  (1.626 m)   Wt 56.3 kg   SpO2 96%   BMI 21.30 kg/m  SpO2: SpO2: 96 % O2 Device: O2 Device: Room Air O2 Flow Rate: O2 Flow Rate (L/min): 2 L/min  Intake/output summary:   Intake/Output Summary (Last 24 hours) at 09/21/2019 1454 Last data filed at 09/21/2019 1016 Gross per 24 hour  Intake 2498.08 ml  Output 1700 ml  Net 798.08 ml   LBM: Last BM Date: 09/20/19 Baseline Weight: Weight: 54.4 kg Most recent weight: Weight: 56.3 kg       Palliative Assessment/Data: PPS 40%    Flowsheet Rows     Most Recent Value  Intake Tab  Referral Department  Hospitalist  Unit at Time of Referral  ER  Date Notified  09/17/19   Palliative Care Type  Return patient Palliative Care  Reason for referral  Clarify Goals of Care  Date of Admission  09/16/19  # of days IP prior to Palliative referral  1  Clinical Assessment  Psychosocial & Spiritual Assessment  Palliative Care Outcomes      Patient Active Problem List   Diagnosis Date Noted  . Hypoxia   . Dysphagia 09/19/2019  . Unstageable pressure ulcer of sacral region ([place]) 09/17/2019  . HCAP (healthcare-associated pneumonia) 09/16/2019  . Pressure injury of skin 08/20/2019  . Goals of care, counseling/discussion   . Palliative care by specialist   . Acute respiratory failure ([place]) 08/19/2019  . Sepsis ([place])   . Community acquired pneumonia   . Snoring 05/21/2017  . Excessive sleepiness 05/21/2017  . Fatigue 05/21/2017  . Eye abnormality 05/21/2017  . Annual physical exam 11/06/2016  . Erroneous encounter - disregard 10/07/2016  . Benign neoplasm of descending colon   . Diarrhea   . Hypotension 07/30/2016  . Elevated blood pressure reading without diagnosis of hypertension 07/18/2016  . Limping 01/08/2016  . Degenerative disc disease, lumbar 12/06/2015  . Down's syndrome 12/06/2015  . Elevated serum creatinine 12/06/2015  . Cardiac murmur 12/06/2015    Palliative Care Assessment & Plan   Patient Profile: 58 y.o. male  with past medical history of Down syndrome, dysphagia, arthritis admitted on 09/16/2019 with cough, shortness of breath, and fevers. Reportedly because hypoxic and placed on oxygen at SNF. Family reports difficulty swallowing and patient was scheduled for MBS on 11/23. Chest xray revealed right lower lobe pneumonia. Admitted for further management of healthcare-associated vs. Aspiration pneumonia. Palliative medicine consultation for goals of care.   Assessment: Sepsis Pneumonia: HCAP vs. Aspiration pneumonia Hypotension Dysphagia Hx of Down's syndrome  Recommendations/Plan:  Patient has documented legal guardian, his sister  Joseph Flores.   MOST form completed with Joseph Flores on 09/21/19. Decisions include: DNR/DNI, limited  additional interventions including non-invasive airway support CPAP/BiPAP, IVF/ABX if indicated and YES to long-term feeding tube if indicated. Copies of durable DNR and MOST form placed in chart and given to Joseph Flores.  Discussed risks with PEG tube placement including ongoing aspiration. Joseph Flores understands but would still like feeding tube placed if necessary to prolong life. Defer to attending. Patient remains with poor oral intake.  MBS scheduled for 10/04/19. Continue dysphagia diet, nectar thick liquids.   Outpatient palliative referral at SNF. SW notified.    Code Status: DNR/DNI   Code Status Orders  (From admission, onward)         Start     Ordered   09/19/19 1944  Do not attempt resuscitation (DNR)  Continuous    Question Answer Comment  In the event of cardiac or respiratory ARREST Do not call a "code blue"   In the event of cardiac or respiratory ARREST Do not perform Intubation, CPR, defibrillation or ACLS   In the event of cardiac or respiratory ARREST Use medication by any route, position, wound care, and other measures to relive pain and suffering. May use oxygen, suction and manual treatment of airway obstruction as needed for comfort.   Comments Per patient's HC-POA, sister Joseph Flores, discussion 11/8 at bedside      09/19/19 1943        Code Status History    Date Active Date Inactive Code Status Order ID Comments User Context   09/16/2019 1521 09/19/2019 1943 Full Code [id number]  Lavina Hamman, MD ED   08/19/2019 1336 08/24/2019 2009 Full Code [id number]  Demetrios Loll, MD ED   Advance Care Planning Activity       Prognosis:   Unable to determine: guarded  Discharge Planning:  To Be Determined  Care plan was discussed with RN, legal guardian/sister Joseph Flores), and updated Dr. Arbutus Ped via secure chat  Thank you for allowing the Palliative Medicine Team to assist in the  care of this patient.   Time In: 1215 Time Out: 1300 Total Time 45 Prolonged Time Billed  no      Greater than 50%  of this time was spent counseling and coordinating care related to the above assessment and plan.  Ihor Dow, DNP, FNP-C Palliative Medicine Team  Phone: [phone] Fax: [phone]  Please contact Palliative Medicine Team phone at [phone] for questions and concerns.

## 2019-09-22 ENCOUNTER — Encounter: Payer: Self-pay | Admitting: Internal Medicine

## 2019-09-22 LAB — CBC WITH DIFFERENTIAL/PLATELET
Abs Immature Granulocytes: 1.31 10*3/uL — ABNORMAL HIGH (ref 0.00–0.07)
Basophils Absolute: 0.1 10*3/uL (ref 0.0–0.1)
Basophils Relative: 1 %
Eosinophils Absolute: 0.1 10*3/uL (ref 0.0–0.5)
Eosinophils Relative: 1 %
HCT: 25.8 % — ABNORMAL LOW (ref 39.0–52.0)
Hemoglobin: 8.5 g/dL — ABNORMAL LOW (ref 13.0–17.0)
Immature Granulocytes: 13 %
Lymphocytes Relative: 16 %
Lymphs Abs: 1.6 10*3/uL (ref 0.7–4.0)
MCH: 29.2 pg (ref 26.0–34.0)
MCHC: 32.9 g/dL (ref 30.0–36.0)
MCV: 88.7 fL (ref 80.0–100.0)
Monocytes Absolute: 0.8 10*3/uL (ref 0.1–1.0)
Monocytes Relative: 8 %
Neutro Abs: 6.1 10*3/uL (ref 1.7–7.7)
Neutrophils Relative %: 61 %
Platelets: 452 10*3/uL — ABNORMAL HIGH (ref 150–400)
RBC: 2.91 MIL/uL — ABNORMAL LOW (ref 4.22–5.81)
RDW: 16 % — ABNORMAL HIGH (ref 11.5–15.5)
Smear Review: NORMAL
WBC: 10 10*3/uL (ref 4.0–10.5)
nRBC: 0.2 % (ref 0.0–0.2)

## 2019-09-22 LAB — BASIC METABOLIC PANEL
Anion gap: 10 (ref 5–15)
BUN: 11 mg/dL (ref 6–20)
CO2: 26 mmol/L (ref 22–32)
Calcium: 7.9 mg/dL — ABNORMAL LOW (ref 8.9–10.3)
Chloride: 103 mmol/L (ref 98–111)
Creatinine, Ser: 0.78 mg/dL (ref 0.61–1.24)
GFR calc Af Amer: >60 mL/min (ref >60)
GFR calc non Af Amer: >60 mL/min (ref >60)
Glucose, Bld: 93 mg/dL (ref 70–99)
Potassium: 3.2 mmol/L — ABNORMAL LOW (ref 3.5–5.1)
Sodium: 139 mmol/L (ref 135–145)

## 2019-09-22 LAB — PROCALCITONIN: Procalcitonin: 0.15 ng/mL

## 2019-09-22 LAB — CORTISOL: Cortisol, Plasma: 9 ug/dL

## 2019-09-22 LAB — MAGNESIUM: Magnesium: 2 mg/dL (ref 1.7–2.4)

## 2019-09-22 MED ORDER — ENOXAPARIN SODIUM 40 MG/0.4ML ~~LOC~~ SOLN
40.0000 mg | SUBCUTANEOUS | Status: DC
  Administered 2019-09-22 – 2019-09-28 (×7): 40 mg via SUBCUTANEOUS
  Filled 2019-09-22 (×7): qty 0.4

## 2019-09-22 MED ORDER — SODIUM CHLORIDE 0.9 % IV BOLUS
1000.0000 mL | Freq: Once | INTRAVENOUS | Status: AC
  Administered 2019-09-22: 1000 mL via INTRAVENOUS

## 2019-09-22 MED ORDER — DEXTROSE-NACL 5-0.9 % IV SOLN
INTRAVENOUS | Status: DC
  Administered 2019-09-22: 18:00:00 via INTRAVENOUS

## 2019-09-22 MED ORDER — POTASSIUM CHLORIDE 20 MEQ PO PACK
40.0000 meq | PACK | Freq: Once | ORAL | Status: AC
  Administered 2019-09-22: 40 meq via ORAL
  Filled 2019-09-22: qty 2

## 2019-09-22 NOTE — Progress Notes (Addendum)
   09/22/19 1600  Vitals  BP (!) 71/46 (notified nurse.NV)  MAP (mmHg) (!) 54   Notified MD standing order to restart NS at 125 ml hour Verbal order to give 105ml bolus. See mar
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
## 2019-09-22 NOTE — Progress Notes (Signed)
Speech Language Pathology Treatment: Dysphagia  Patient Details Name: Joseph Flores MRN: [id number] DOB: 04-09-1961 Today's Date: 09/22/2019 Time: 1325-1405 SLP Time Calculation (min) (ACUTE ONLY): 40 min  Assessment / Plan / Recommendation Clinical Impression  Pt seen for ongoing toleration of recommended Dysphagia diet; NSG education w/ supportive feeding strategies. Pt's MBSS was completed on 09/17/2019(last Friday). Per chart notes, pt's Family member had reached out to pt's PCP/MD while pt was at the [facility] Home re: PEG placement PRIOR to this hospitalization d/t pt not taking oral intake and declining in the past several months (pt's family members used to feed him at the [facility] per report). Suspect concern for FTT(?). Pt has had a previous admission to this hospital last month for similar complaints of pneumonia s/p an aspiration/choking event at [facility] [place]. At that admission, he was recommended to discharge back to the Group Home on a Dysphagia 1 (puree); Nectar-thick liquid (08/24/2019) -- unsure of his diet there at the [facility] when he returned until now. A MBSS was completed on 09/17/2019 s/p admission this time revealing oropharyngeal phase dysphagia w/ increased risk for aspiration suspect in light of pt's baseline Down Syndrome w/ decline Cognitive status. He requires feeding at baseline. With supportive feeding strategies and a modified diet (a Dysphagia level 1 (puree) w/ Nectar consistency liquids), it appeared pt's risk for aspiration was reduced and pt was able to adequately take oral intake. Time b/t trials was necessary d/t increased pharyngeal phase residue noted. See report and recommendations for details. Per NSG notes, pt has demonstrated disinterest in taking po's at meals; mild coughing noted during the lunch meal today. SLP requested to f/u again. Pt needed FULL positioning support for midline and head forward - pillows and towel rolls needed. Pt is contracted in bed  at baseline and tends to lean to one side. Pt exhibited min grimacing w/ repositioning so time was given to ease discomfort. Pt was then fed tsp/straw trials of NECTAR liquids then tsps of puree. NO immediate, overt clinical s/s of aspiration were noted - no coughing, no decline in respiratory effort during/post trials. SLP gave TIME b/t each trial to allow for pt to use independent, dry swallows to clear posterior oral, and pharyngeal, residue - ANY residue can increased risk for choking/aspiratio AFTER the swallow. As the MBSS noted, Time b/t trials for f/u, Dry swallows, and alternating foods/liquids, the pharyngeal residue reduced in nature/amounts. NO aspiration or build-up of bolus residue in the laryngeal vestibule noted during the study. During drinking w/ NECTAR liquids, pt drank from the straw most often w/ appropriate labial closure on the straw --- needed monitoring for impulsive drinking behaviors (pinching straw, removing straw) to slow drinking and multiple, gulping sips in order to lessen risk for aspiration, choking. W/ pt's presentation today during po trials of Puree and Nectar consistency liquids, recommend continue w/ current diet and aspiration precautions/strategies as recommended at the MBSS on 09/17/2019. NSG updated on precautions as posted in room; pt's presentation and need for FULL upright positioning and TIME w/ all oral intake to clear b/t trials. Discussed w/ Palliative Care NP that if there is concern for FTT/decreased oral intake and pt not meeting his nutritional needs adequately, then option of alternative means of feeding should be considered. Recommend strict adherence to this recommended Dysphagia diet at discharge; aspiration precautions; feeding support and positioning; Pills Crushed in Puree. NP will f/u w/ pt's family for [place]. NSG updated.      HPI HPI: Pt  is a 58 y.o. male with a past medical history of Down syndrome, arthritis, degeneration of lumbar disc, Nonverbal  w/ Cognitive decline who presents to the emergency department  from [facility] facility with worsening shortness of breath over the past 2 to 3 days, diagnosed with Right lobe pneumonia on chest x-ray yesterday.  Pt has had a previous admission last month for similar complaints of pneumonia s/p an aspiration/choking event at [place].  He was recommended to discharge back to the [facility] on a Dysphagia 1 (puree); Nectar-thick liquid (08/24/2019) -- unsure of his diet there.  MBSS completed on 09/17/2019 revealing oropharyngeal phase dysphagia w/ increased risk for aspiration. With supportive feeding strategies and a modified diet to a Dysphagia level 1 (puree) w/ Nectar consistency liquids, it appeared pt's risk for aspiration was reduced. See report and recommendations.       SLP Plan  All goals met(provided education in room)       Recommendations  Diet recommendations: Dysphagia 1 (puree);Nectar-thick liquid Liquids provided via: Cup;Teaspoon;Straw(pinch straw to limit impulsive drinking; small amt) Medication Administration: Crushed with puree(for safer swallowing, clearing) Supervision: Staff to assist with self feeding;Full supervision/cueing for compensatory strategies Compensations: Minimize environmental distractions;Slow rate;Small sips/bites;Lingual sweep for clearance of pocketing;Multiple dry swallows after each bite/sip;Follow solids with liquid(TIME b/t each bite/sip to allow for clearing) Postural Changes and/or Swallow Maneuvers: Seated upright 90 degrees;Upright 30-60 min after meal                General recommendations: (Dietician f/u) Oral Care Recommendations: Oral care BID;Oral care before and after PO;Staff/trained caregiver to provide oral care Follow up Recommendations: None(from a skilled need; feeding support at meals needed) SLP Visit Diagnosis: Dysphagia, oropharyngeal phase (R13.12)(baseline Cognitive deficits impacting) Plan: All goals  met(provided education in room)       [place], MS, CCC-SLP Watson,Katherine 09/22/2019, 3:47 PM

## 2019-09-22 NOTE — Progress Notes (Addendum)
   09/22/19 1829  Vitals  BP (!) 89/58  BP Method Automatic  Notififed MD [place]. BP after bolus via amnion and epic page. Inquired about Midodrine. Started ordered fluids at 38ml hour after bolus complete.  Addendum. No response from from MD [facility]. Paged swing MD Andrew Au verbal to keep monitoring and notifed MD if Map < 65  spb<80

## 2019-09-22 NOTE — Progress Notes (Signed)
Progress Note    [name]  QAS:[id number] DOB: 23-Jan-1961  DOA: 09/16/2019 PCP: Dianne Dun, MD      Brief Narrative:    Medical records reviewed and are as summarized below:  Joseph Flores is an 58 y.o. male with medical history significant for Down syndrome, dysphagia, who presented to the hospital from the nursing home with shortness of breath and cough.  He was found to have acute hypoxemic respiratory failure and pneumonia, probable aspiration pneumonia.      Assessment/Plan:   Principal Problem:   HCAP (healthcare-associated pneumonia) Active Problems:   Down's syndrome   Hypotension   Pressure injury of skin   Unstageable pressure ulcer of sacral region ([place])   Dysphagia   Hypoxia   Body mass index is 21.3 kg/m.   Family Communication/Anticipated D/C date and plan/Code Status   DVT prophylaxis: Lovenox Code Status: DNR Family Communication: Plan discussed with his sister, Bethena Roys Disposition Plan: Possible discharge to SNF in 3 days  Sepsis secondary to pneumonia (probable aspiration pneumonia): Patient has completed IV cefepime today.  Hypotension: Ordered IV normal saline bolus.  Continue with maintenance fluids.  Dysphagia: Patient has severe dysphagia and is unable to meet his nutritional needs.  This was discussed with Bethena Roys, his sister and H POA, who is requesting PEG tube placement for nutrition.  Consulted gastroenterologist to evaluate for PEG tube placement.  Chronic anemia: Hemoglobin is stable.  No evidence of active bleeding.  AKI and hypernatremia: Resolved  Unstageable sacral decubitus ulcer: This was present on admission.  Continue local wound care  Down's syndrome: Patient is bedridden and wheelchair-bound    Subjective:   Patient is nonverbal and unable to provide any history  Objective:    Vitals:   09/22/19 1437 09/22/19 1600 09/22/19 1644 09/22/19 1719  BP: (!) 83/62 (!) 71/46 (!) 87/61 92/77  Pulse: 94  88 95 98  Resp: 16  16 15   Temp: 99.1 F (37.3 C) 98.6 F (37 C) 98.7 F (37.1 C) 98.2 F (36.8 C)  TempSrc: Oral Axillary Axillary Axillary  SpO2: 97% 96% 98% 98%  Weight:      Height:        Intake/Output Summary (Last 24 hours) at 09/22/2019 1744 Last data filed at 09/22/2019 1700 Gross per 24 hour  Intake 659.61 ml  Output 1250 ml  Net -590.39 ml   Filed Weights   09/16/19 1237 09/17/19 0103  Weight: 54.4 kg 56.3 kg    Exam:  GEN: NAD SKIN: Unstageable sacral decubitus ulcer.  There is a scar/ healing wound on the left lateral malleolus EYES: EOMI ENT: MMM CV: RRR PULM: CTA B ABD: soft, ND, NT, +BS CNS: Alert but nonverbal.  He moves extremities spontaneously. EXT: No edema or tenderness   Data Reviewed:   I have personally reviewed following labs and imaging studies:  Labs: Labs show the following:   Basic Metabolic Panel: Recent Labs  Lab 09/18/19 0458 09/19/19 0552 09/20/19 0426 09/20/19 1437 09/21/19 0406 09/22/19 0500  NA 146* 143 139  --  138 139  K 3.5 3.0* 3.3*  --  3.5 3.2*  CL 105 102 103  --  105 103  CO2 29 30 28   --  24 26  GLUCOSE 91 109* 93  --  84 93  BUN 17 15 10   --  11 11  CREATININE 0.86 0.83 0.81  --  0.80 0.78  CALCIUM 8.5* 8.1* 8.1*  --  7.8* 7.9*  MG  --   --  2.0 1.9  --  2.0   GFR Estimated Creatinine Clearance: 81.1 mL/min (by C-G formula based on SCr of 0.78 mg/dL). Liver Function Tests: Recent Labs  Lab 09/16/19 1030 09/17/19 0510  AST 33 20  ALT 16 15  ALKPHOS 55 47  BILITOT 0.4 0.6  PROT 7.0 6.5  ALBUMIN 2.3* 2.2*   No results for input(s): LIPASE, AMYLASE in the last 168 hours. No results for input(s): AMMONIA in the last 168 hours. Coagulation profile No results for input(s): INR, PROTIME in the last 168 hours.  CBC: Recent Labs  Lab 09/18/19 0458 09/19/19 0552 09/20/19 0426 09/21/19 0406 09/22/19 0500  WBC 9.4 9.7 11.5* 11.3* 10.0  NEUTROABS 6.4 6.0  --  6.9 6.1  HGB 8.3* 8.1* 7.6* 7.6*  8.5*  HCT 26.6* 26.4* 24.7* 25.0* 25.8*  MCV 93.7 93.6 95.0 94.0 88.7  PLT 449* 410* 427* 454* 452*   Cardiac Enzymes: No results for input(s): CKTOTAL, CKMB, CKMBINDEX, TROPONINI in the last 168 hours. BNP (last 3 results) No results for input(s): PROBNP in the last 8760 hours. CBG: No results for input(s): GLUCAP in the last 168 hours. D-Dimer: No results for input(s): DDIMER in the last 72 hours. Hgb A1c: No results for input(s): HGBA1C in the last 72 hours. Lipid Profile: No results for input(s): CHOL, HDL, LDLCALC, TRIG, CHOLHDL, LDLDIRECT in the last 72 hours. Thyroid function studies: No results for input(s): TSH, T4TOTAL, T3FREE, THYROIDAB in the last 72 hours.  Invalid input(s): FREET3 Anemia work up: No results for input(s): VITAMINB12, FOLATE, FERRITIN, TIBC, IRON, RETICCTPCT in the last 72 hours. Sepsis Labs: Recent Labs  Lab 09/19/19 0552 09/20/19 0426 09/21/19 0406 09/22/19 0500  PROCALCITON  --   --  <0.10 0.15  WBC 9.7 11.5* 11.3* 10.0    Microbiology Recent Results (from the past 240 hour(s))  Blood culture (routine x 2)     Status: None   Collection Time: 09/16/19 12:47 PM   Specimen: BLOOD  Result Value Ref Range Status   Specimen Description BLOOD BLOOD LEFT HAND  Final   Special Requests   Final    BOTTLES DRAWN AEROBIC AND ANAEROBIC Blood Culture adequate volume   Culture   Final    NO GROWTH 5 DAYS Performed at [facility], [address] [place], [place] [address]    Report Status 09/21/2019 FINAL  Final  Blood culture (routine x 2)     Status: None   Collection Time: 09/16/19 12:47 PM   Specimen: BLOOD  Result Value Ref Range Status   Specimen Description BLOOD BLOOD RIGHT HAND  Final   Special Requests   Final    BOTTLES DRAWN AEROBIC AND ANAEROBIC Blood Culture adequate volume   Culture   Final    NO GROWTH 5 DAYS Performed at [facility], [address], [place], [place] [address]    Report Status 09/21/2019  FINAL  Final  SARS CORONAVIRUS 2 (TAT 6-24 HRS) Nasopharyngeal Nasopharyngeal Swab     Status: None   Collection Time: 09/16/19 12:47 PM   Specimen: Nasopharyngeal Swab  Result Value Ref Range Status   SARS Coronavirus 2 NEGATIVE NEGATIVE Final    Comment: (NOTE) SARS-CoV-2 target nucleic acids are NOT DETECTED. The SARS-CoV-2 RNA is generally detectable in upper and lower respiratory specimens during the acute phase of infection. Negative results do not preclude SARS-CoV-2 infection, do not rule out co-infections with other pathogens, and should not be used  as the sole basis for treatment or other patient management decisions. Negative results must be combined with clinical observations, patient history, and epidemiological information. The expected result is Negative. Fact Sheet for Patients: SugarRoll.be Fact Sheet for Healthcare Providers: https://www.woods-mathews.com/ This test is not yet approved or cleared by the [place] FDA and  has been authorized for detection and/or diagnosis of SARS-CoV-2 by FDA under an Emergency Use Authorization (EUA). This EUA will remain  in effect (meaning this test can be used) for the duration of the COVID-19 declaration under Section 56 4(b)(1) of the Act, 21 U.S.C. section 360bbb-3(b)(1), unless the authorization is terminated or revoked sooner. Performed at [facility], [place] [address] [place], [place] [address]   MRSA PCR Screening     Status: None   Collection Time: 09/16/19  2:30 PM   Specimen: Nasopharyngeal  Result Value Ref Range Status   MRSA by PCR NEGATIVE NEGATIVE Final    Comment:        The GeneXpert MRSA Assay (FDA approved for NASAL specimens only), is one component of a comprehensive MRSA colonization surveillance program. It is not intended to diagnose MRSA infection nor to guide or monitor treatment for MRSA infections. Performed at [facility], [address] [place], [place] [address]     Procedures and diagnostic studies:  No results found.  Medications:   . acidophilus  1 capsule Oral Daily  . Chlorhexidine Gluconate Cloth  6 each Topical Daily  . citalopram  10 mg Oral Daily  . collagenase   Topical Daily  . enoxaparin (LOVENOX) injection  40 mg Subcutaneous Q24H  . guaiFENesin  600 mg Oral Q12H  . tamsulosin  0.4 mg Oral QPM   Continuous Infusions:    LOS: 6 days   Dima Mini  [facility] Pager [phone].   *Please refer to [email or web address], password [email or web address] to get updated schedule on who will round on this patient, as hospitalists switch teams weekly. If 7PM-7AM, please contact night-coverage at [email or web address], password [email or web address] for any overnight needs.  09/22/2019, 5:44 PM

## 2019-09-22 NOTE — Progress Notes (Signed)
Anticoagulation monitoring(Lovenox):  58yo  male ordered Lovenox 30 mg Q24h  Filed Weights   09/16/19 1237 09/17/19 0103  Weight: 120 lb (54.4 kg) 124 lb 1.9 oz (56.3 kg)   Body mass index is 21.3 kg/m.    Lab Results  Component Value Date   CREATININE 0.78 09/22/2019   CREATININE 0.80 09/21/2019   CREATININE 0.81 09/20/2019   Estimated Creatinine Clearance: 81.1 mL/min (by C-G formula based on SCr of 0.78 mg/dL). Hemoglobin & Hematocrit     Component Value Date/Time   HGB 8.5 (L) 09/22/2019 0500   HGB 11.1 (L) 08/26/2014 2020   HCT 25.8 (L) 09/22/2019 0500   HCT 35.0 (L) 08/26/2014 2020     Per Protocol for Patient with estCrcl > 30 ml/min, weight >45 kg, and BMI < 40, will transition to Lovenox 40 mg Q24h.

## 2019-09-22 NOTE — TOC Progression Note (Signed)
Transition of Care [facility]) - Progression Note    Patient Details  Name: Joseph Flores MRN: [id number] Date of Birth: [birth year]  Transition of Care [facility]) CM/SW [place], RN Phone Number: 09/22/2019, 2:08 PM  Clinical Narrative:    The patient does not need a Modified Barium Swallow on the 23rd and it was cancelled, I called and notified WOM        Expected Discharge Plan and Services                                                 Social Determinants of Health (SDOH) Interventions    Readmission Risk Interventions No flowsheet data found.

## 2019-09-23 DIAGNOSIS — R1312 Dysphagia, oropharyngeal phase: Secondary | ICD-10-CM

## 2019-09-23 MED ORDER — ONDANSETRON HCL 4 MG/2ML IJ SOLN: 4.0000 mg | Freq: Four times a day (QID) | INTRAMUSCULAR | Status: DC | PRN

## 2019-09-23 MED ORDER — KCL IN DEXTROSE-NACL 40-5-0.9 MEQ/L-%-% IV SOLN
INTRAVENOUS | Status: DC
  Administered 2019-09-23 – 2019-09-24 (×2): via INTRAVENOUS
  Filled 2019-09-23 (×4): qty 1000

## 2019-09-23 MED ORDER — OXYCODONE HCL 5 MG PO TABS
5.0000 mg | ORAL_TABLET | Freq: Four times a day (QID) | ORAL | Status: DC | PRN
  Administered 2019-09-23: 5 mg via ORAL
  Filled 2019-09-23: qty 1

## 2019-09-23 NOTE — Progress Notes (Signed)
Progress Note    Joseph Flores  UXN:[id number] DOB: 25-Jul-1961  DOA: 09/16/2019 PCP: Joseph Dun, MD      Brief Narrative:    Medical records reviewed and are as summarized below:  Joseph Flores is an 58 y.o. male with medical history significant for Down syndrome, dysphagia, who presented to the hospital from the nursing home with shortness of breath and cough.  He was found to have acute hypoxemic respiratory failure and pneumonia, probable aspiration pneumonia.      Assessment/Plan:   Principal Problem:   HCAP (healthcare-associated pneumonia) Active Problems:   Down's syndrome   Hypotension   Pressure injury of skin   Unstageable pressure ulcer of sacral region ([place])   Dysphagia   Hypoxia   Body mass index is 21.3 kg/m.   Family Communication/Anticipated D/C date and plan/Code Status   DVT prophylaxis: Lovenox Code Status: DNR Family Communication: Plan discussed with his sister, Joseph Flores today Disposition Plan: Possible discharge to SNF in 3 days  Sepsis secondary to pneumonia (probable aspiration pneumonia): Completed IV cefepime on 09/22/2019  Hypotension: BP has improved off of IV fluids.  Dysphagia: Plan for PEG tube placement by gastroenterologist.  Chronic anemia: Hemoglobin is stable.  No evidence of active bleeding.  AKI and hypernatremia: Resolved  Unstageable sacral decubitus ulcer: This was present on admission.  Continue local wound care  Down's syndrome: Patient is bedridden and wheelchair-bound    Subjective:   Patient is unable to provide any history. His nurse said that he has been tolerating some of the pureed diet.  Objective:    Vitals:   09/23/19 0332 09/23/19 0806 09/23/19 1508 09/23/19 2048  BP: 128/73 115/65 118/69 91/60  Pulse: 86 90 94 80  Resp: 17 18 18 16   Temp: 98.2 F (36.8 C) 98.3 F (36.8 C) 98.6 F (37 C) 99.2 F (37.3 C)  TempSrc: Axillary Axillary Axillary Axillary  SpO2: 97% 98% 100%  98%  Weight:      Height:        Intake/Output Summary (Last 24 hours) at 09/23/2019 2051 Last data filed at 09/23/2019 1701 Gross per 24 hour  Intake -  Output 2125 ml  Net -2125 ml   Filed Weights   09/16/19 1237 09/17/19 0103  Weight: 54.4 kg 56.3 kg    Exam:  GEN: NAD SKIN: Scar on the left lateral malleolus of the left foot.  Unstageable sacral decubitus ulcer present on admission EYES: No pallor or icterus ENT: MMM CV: RRR PULM: CTA B ABD: soft, ND, ? tenderness, +BS CNS: alert but non verbal, confused, non focal EXT: No edema or tenderness    Data Reviewed:   I have personally reviewed following labs and imaging studies:  Labs: Labs show the following:   Basic Metabolic Panel: Recent Labs  Lab 09/18/19 0458 09/19/19 0552 09/20/19 0426 09/20/19 1437 09/21/19 0406 09/22/19 0500  NA 146* 143 139  --  138 139  K 3.5 3.0* 3.3*  --  3.5 3.2*  CL 105 102 103  --  105 103  CO2 29 30 28   --  24 26  GLUCOSE 91 109* 93  --  84 93  BUN 17 15 10   --  11 11  CREATININE 0.86 0.83 0.81  --  0.80 0.78  CALCIUM 8.5* 8.1* 8.1*  --  7.8* 7.9*  MG  --   --  2.0 1.9  --  2.0   GFR Estimated Creatinine Clearance: 81.1 mL/min (by  C-G formula based on SCr of 0.78 mg/dL). Liver Function Tests: Recent Labs  Lab 09/17/19 0510  AST 20  ALT 15  ALKPHOS 47  BILITOT 0.6  PROT 6.5  ALBUMIN 2.2*   No results for input(s): LIPASE, AMYLASE in the last 168 hours. No results for input(s): AMMONIA in the last 168 hours. Coagulation profile No results for input(s): INR, PROTIME in the last 168 hours.  CBC: Recent Labs  Lab 09/18/19 0458 09/19/19 0552 09/20/19 0426 09/21/19 0406 09/22/19 0500  WBC 9.4 9.7 11.5* 11.3* 10.0  NEUTROABS 6.4 6.0  --  6.9 6.1  HGB 8.3* 8.1* 7.6* 7.6* 8.5*  HCT 26.6* 26.4* 24.7* 25.0* 25.8*  MCV 93.7 93.6 95.0 94.0 88.7  PLT 449* 410* 427* 454* 452*   Cardiac Enzymes: No results for input(s): CKTOTAL, CKMB, CKMBINDEX, TROPONINI in  the last 168 hours. BNP (last 3 results) No results for input(s): PROBNP in the last 8760 hours. CBG: No results for input(s): GLUCAP in the last 168 hours. D-Dimer: No results for input(s): DDIMER in the last 72 hours. Hgb A1c: No results for input(s): HGBA1C in the last 72 hours. Lipid Profile: No results for input(s): CHOL, HDL, LDLCALC, TRIG, CHOLHDL, LDLDIRECT in the last 72 hours. Thyroid function studies: No results for input(s): TSH, T4TOTAL, T3FREE, THYROIDAB in the last 72 hours.  Invalid input(s): FREET3 Anemia work up: No results for input(s): VITAMINB12, FOLATE, FERRITIN, TIBC, IRON, RETICCTPCT in the last 72 hours. Sepsis Labs: Recent Labs  Lab 09/19/19 0552 09/20/19 0426 09/21/19 0406 09/22/19 0500  PROCALCITON  --   --  <0.10 0.15  WBC 9.7 11.5* 11.3* 10.0    Microbiology Recent Results (from the past 240 hour(s))  Blood culture (routine x 2)     Status: None   Collection Time: 09/16/19 12:47 PM   Specimen: BLOOD  Result Value Ref Range Status   Specimen Description BLOOD BLOOD LEFT HAND  Final   Special Requests   Final    BOTTLES DRAWN AEROBIC AND ANAEROBIC Blood Culture adequate volume   Culture   Final    NO GROWTH 5 DAYS Performed at [facility], [address] [place], [place] [address]    Report Status 09/21/2019 FINAL  Final  Blood culture (routine x 2)     Status: None   Collection Time: 09/16/19 12:47 PM   Specimen: BLOOD  Result Value Ref Range Status   Specimen Description BLOOD BLOOD RIGHT HAND  Final   Special Requests   Final    BOTTLES DRAWN AEROBIC AND ANAEROBIC Blood Culture adequate volume   Culture   Final    NO GROWTH 5 DAYS Performed at [facility], [address] [place], [place] [address]    Report Status 09/21/2019 FINAL  Final  SARS CORONAVIRUS 2 (TAT 6-24 HRS) Nasopharyngeal Nasopharyngeal Swab     Status: None   Collection Time: 09/16/19 12:47 PM   Specimen: Nasopharyngeal Swab  Result Value  Ref Range Status   SARS Coronavirus 2 NEGATIVE NEGATIVE Final    Comment: (NOTE) SARS-CoV-2 target nucleic acids are NOT DETECTED. The SARS-CoV-2 RNA is generally detectable in upper and lower respiratory specimens during the acute phase of infection. Negative results do not preclude SARS-CoV-2 infection, do not rule out co-infections with other pathogens, and should not be used as the sole basis for treatment or other patient management decisions. Negative results must be combined with clinical observations, patient history, and epidemiological information. The expected result is Negative. Fact  Sheet for Patients: SugarRoll.be Fact Sheet for Healthcare Providers: https://www.woods-mathews.com/ This test is not yet approved or cleared by the [place] FDA and  has been authorized for detection and/or diagnosis of SARS-CoV-2 by FDA under an Emergency Use Authorization (EUA). This EUA will remain  in effect (meaning this test can be used) for the duration of the COVID-19 declaration under Section 56 4(b)(1) of the Act, 21 U.S.C. section 360bbb-3(b)(1), unless the authorization is terminated or revoked sooner. Performed at [facility], [place] [address], [place], [place] [address]   MRSA PCR Screening     Status: None   Collection Time: 09/16/19  2:30 PM   Specimen: Nasopharyngeal  Result Value Ref Range Status   MRSA by PCR NEGATIVE NEGATIVE Final    Comment:        The GeneXpert MRSA Assay (FDA approved for NASAL specimens only), is one component of a comprehensive MRSA colonization surveillance program. It is not intended to diagnose MRSA infection nor to guide or monitor treatment for MRSA infections. Performed at [facility], [address] [place], [place] [address]     Procedures and diagnostic studies:  No results found.  Medications:   . acidophilus  1 capsule Oral Daily  . Chlorhexidine Gluconate  Cloth  6 each Topical Daily  . citalopram  10 mg Oral Daily  . collagenase   Topical Daily  . enoxaparin (LOVENOX) injection  40 mg Subcutaneous Q24H  . guaiFENesin  600 mg Oral Q12H  . tamsulosin  0.4 mg Oral QPM   Continuous Infusions: . dextrose 5 % and 0.9 % NaCl with KCl 40 mEq/L 50 mL/hr at 09/23/19 1019     LOS: 7 days   Garon Melander  [facility] Pager [phone].   *Please refer to [email or web address], password [email or web address] to get updated schedule on who will round on this patient, as hospitalists switch teams weekly. If 7PM-7AM, please contact night-coverage at [email or web address], password [email or web address] for any overnight needs.  09/23/2019, 8:51 PM

## 2019-09-23 NOTE — Progress Notes (Signed)
GI Inpatient Follow-up Note  Subjective:  Patient seen if follow-up of aspiration pneumonia secondary to oropharyngeal dysphagia in the setting of altered mental status due to Down syndrome with mutism. Function and clinical status has been in decline over the last few months. He was admitted for PEG tube evaluation given his inability to safely maintain nutrition, with recent MBSS/SLP evaluation recommended Dysphagia 1 diet.   Hypotension noted overnight. No acute events.   Scheduled Inpatient Medications:  . acidophilus  1 capsule Oral Daily  . Chlorhexidine Gluconate Cloth  6 each Topical Daily  . citalopram  10 mg Oral Daily  . collagenase   Topical Daily  . enoxaparin (LOVENOX) injection  40 mg Subcutaneous Q24H  . guaiFENesin  600 mg Oral Q12H  . tamsulosin  0.4 mg Oral QPM    Continuous Inpatient Infusions:   . dextrose 5 % and 0.9 % NaCl with KCl 40 mEq/L 50 mL/hr at 09/23/19 1019    PRN Inpatient Medications:  acetaminophen, ipratropium-albuterol, ondansetron (ZOFRAN) IV, oxyCODONE  Review of Systems: Constitutional: Weight is stable.  Eyes: No changes in vision. ENT: No oral lesions, sore throat.  GI: see HPI.  Heme/Lymph: No easy bruising.  CV: No chest pain.  GU: No hematuria.  Integumentary: No rashes.  Neuro: No headaches.  Psych: No depression/anxiety.  Endocrine: No heat/cold intolerance.  Allergic/Immunologic: No urticaria.  Resp: No cough, SOB.  Musculoskeletal: No joint swelling.    Physical Examination: BP 118/69 (BP Location: Left Arm)   Pulse 94   Temp 98.6 F (37 C) (Axillary)   Resp 18   Ht 5\' 4"  (1.626 m)   Wt 56.3 kg   SpO2 100%   BMI 21.30 kg/m  Gen: NAD, alert and oriented x 4 HEENT: PEERLA, EOMI, Neck: supple, no JVD or thyromegaly Chest: CTA bilaterally, no wheezes, crackles, or other adventitious sounds CV: RRR, no m/g/c/r Abd: soft, NT, ND, +BS in all four quadrants; no HSM, guarding, ridigity, or rebound tenderness Ext: no  edema, well perfused with 2+ pulses, Skin: no rash or lesions noted Lymph: no LAD  Data: Lab Results  Component Value Date   WBC 10.0 09/22/2019   HGB 8.5 (L) 09/22/2019   HCT 25.8 (L) 09/22/2019   MCV 88.7 09/22/2019   PLT 452 (H) 09/22/2019   Recent Labs  Lab 09/20/19 0426 09/21/19 0406 09/22/19 0500  HGB 7.6* 7.6* 8.5*   Lab Results  Component Value Date   NA 139 09/22/2019   K 3.2 (L) 09/22/2019   CL 103 09/22/2019   CO2 26 09/22/2019   BUN 11 09/22/2019   CREATININE 0.78 09/22/2019   Lab Results  Component Value Date   ALT 15 09/17/2019   AST 20 09/17/2019   ALKPHOS 47 09/17/2019   BILITOT 0.6 09/17/2019   No results for input(s): APTT, INR, PTT in the last 168 hours.   Assessment/Plan: Mr. Kille is a 58 y.o. male with a PMH of Down's syndrome with declining cognitive status, DDD, arthritis admitted to the hospital for aspiration pneumonia  1. Recurrent aspiration pneumonia 2. Oropharyngeal dysphagia 3. Declining cognitive status 4. Failure to thrive   Recommendations:  1. Continue IV antibiotics and supportive care. 2. Hold off on PEG tube placement until hypotension improves. Further plans regarding PEG tube placement per Dr. Alice Reichert. Patient's sister and POA, Mrs. Selinda Michaels, updated by Octavia Bruckner, PA-C. 3. Continue Dobhoff tube feeds in the interval as needed. 4. We will continue to follow.  Please call with questions  or concerns.    Lavera Guise, PA-C [facility] [phone]

## 2019-09-23 NOTE — Care Management Important Message (Signed)
Important Message  Patient Details  Name: Joseph Flores MRN: [id number] Date of Birth: [birth year]   Medicare Important Message Given:  Yes     Juliann Pulse A Faige Seely 09/23/2019, 1:47 PM

## 2019-09-24 ENCOUNTER — Other Ambulatory Visit: Payer: Self-pay

## 2019-09-24 ENCOUNTER — Inpatient Hospital Stay: Payer: Medicare Other

## 2019-09-24 DIAGNOSIS — R627 Adult failure to thrive: Secondary | ICD-10-CM

## 2019-09-24 DIAGNOSIS — R6251 Failure to thrive (child): Secondary | ICD-10-CM

## 2019-09-24 LAB — POTASSIUM: Potassium: 4.2 mmol/L (ref 3.5–5.1)

## 2019-09-24 LAB — GLUCOSE, CAPILLARY
Glucose-Capillary: 95 mg/dL (ref 70–99)
Glucose-Capillary: 94 mg/dL (ref 70–99)

## 2019-09-24 MED ORDER — MIDODRINE HCL 5 MG PO TABS
5.0000 mg | ORAL_TABLET | Freq: Three times a day (TID) | ORAL | Status: DC
  Administered 2019-09-24: 5 mg via ORAL
  Filled 2019-09-24: qty 1

## 2019-09-24 MED ORDER — IOHEXOL 300 MG/ML  SOLN
2.0000 mL | Freq: Once | INTRAMUSCULAR | Status: AC | PRN
  Administered 2019-09-24: 2 mL

## 2019-09-24 MED ORDER — OSMOLITE 1.2 CAL PO LIQD
1000.0000 mL | ORAL | Status: DC
  Administered 2019-09-24 – 2019-09-28 (×3): 1000 mL

## 2019-09-24 MED ORDER — DEXTROSE-NACL 5-0.9 % IV SOLN
INTRAVENOUS | Status: DC
  Administered 2019-09-24 – 2019-09-25 (×3): via INTRAVENOUS

## 2019-09-24 MED ORDER — SODIUM CHLORIDE 0.9 % IV BOLUS
500.0000 mL | Freq: Once | INTRAVENOUS | Status: AC
  Administered 2019-09-24: 500 mL via INTRAVENOUS

## 2019-09-24 MED ORDER — PRO-STAT SUGAR FREE PO LIQD
30.0000 mL | Freq: Every day | ORAL | Status: DC
  Administered 2019-09-24 – 2019-09-29 (×5): 30 mL

## 2019-09-24 MED ORDER — JEVITY 1.2 CAL PO LIQD: 1000.0000 mL | ORAL | Status: DC

## 2019-09-24 NOTE — Progress Notes (Signed)
Patient became lethargic,diaphorectic and hypotensive this shift, MD notified, 500 ml bolus given with improvement. Blood sugar WNL, temperature WNL, will continue to monitor.

## 2019-09-24 NOTE — Progress Notes (Addendum)
GI Inpatient Follow-up Note  Subjective:  Patient seen in follow-up of aspiration pneumonia 2/2 oropharyngeal dysphagia in the setting of AMS due to Down syndrome with mutism. Overnight patient become lethargic, diaphoretic, and hypotensive. HE received a fluid bolus with improvement. Patient's sister and legal guardian - Joseph Flores is present in room during time of examination. He is only eating small bites of food if given to him. He is hollering out this afternoon, but appears at this baseline per sister.  Scheduled Inpatient Medications:  . acidophilus  1 capsule Oral Daily  . Chlorhexidine Gluconate Cloth  6 each Topical Daily  . citalopram  10 mg Oral Daily  . collagenase   Topical Daily  . enoxaparin (LOVENOX) injection  40 mg Subcutaneous Q24H  . guaiFENesin  600 mg Oral Q12H  . midodrine  5 mg Oral TID WC  . tamsulosin  0.4 mg Oral QPM    Continuous Inpatient Infusions:   . dextrose 5 % and 0.9% NaCl 100 mL/hr at 09/24/19 0835    PRN Inpatient Medications:  acetaminophen, ipratropium-albuterol, ondansetron (ZOFRAN) IV, oxyCODONE  Review of Systems:  Unable to obtain due to patient's mental status    Physical Examination: BP (!) 88/61 (BP Location: Left Arm)   Pulse 69   Temp 98.3 F (36.8 C) (Oral)   Resp 17   Ht 5\' 4"  (1.626 m)   Wt 56.3 kg   SpO2 100%   BMI 21.30 kg/m  Gen: NAD, alert  HEENT: PEERLA, EOMI, Neck: supple, no JVD or thyromegaly Chest: CTA bilaterally, no wheezes, crackles, or other adventitious sounds CV: RRR, no m/g/c/r Abd: soft, NT, ND, +BS in all four quadrants; no HSM, guarding, ridigity, or rebound tenderness Ext: no edema, well perfused with 2+ pulses, Skin: no rash or lesions noted Lymph: no LAD  Data: Lab Results  Component Value Date   WBC 10.0 09/22/2019   HGB 8.5 (L) 09/22/2019   HCT 25.8 (L) 09/22/2019   MCV 88.7 09/22/2019   PLT 452 (H) 09/22/2019   Recent Labs  Lab 09/20/19 0426 09/21/19 0406 09/22/19 0500  HGB 7.6*  7.6* 8.5*   Lab Results  Component Value Date   NA 139 09/22/2019   K 4.2 09/24/2019   CL 103 09/22/2019   CO2 26 09/22/2019   BUN 11 09/22/2019   CREATININE 0.78 09/22/2019   Lab Results  Component Value Date   ALT 15 09/17/2019   AST 20 09/17/2019   ALKPHOS 47 09/17/2019   BILITOT 0.6 09/17/2019   No results for input(s): APTT, INR, PTT in the last 168 hours.  Assessment/Plan: Joseph Flores is a 58 y.o. male with a PMH of Down's syndrome with declining cognitive status, DDD, arthritis admitted to the hospital for aspiration pneumonia  1. Recurrent aspiration pneumonia 2. Oropharyngeal dysphagia 3. Declining cognitive status 4. Failure to thrive   Recommendations:  1. Continue IV antibiotics and supportive care. 2. Start Dobhoff tube feeds 3. Consult to IR for Dobhoff placement 4. Consult to dietary for tube feeds 5. Plan for PEG tube placement on Monday with Dr. Alice Reichert 6. Dr. Bonna Gains will be on call over the weekend should patient's clinical status change over the weekend   I reviewed the risks (including bleeding, perforation, infection, anesthesia complications, cardiac/respiratory complications), benefits and alternatives of PEG Tube placement. Patient's sister consents to proceed.    Please call with questions or concerns.    Octavia Bruckner, PA-C [facility] [phone] (Cell)

## 2019-09-24 NOTE — Progress Notes (Signed)
Initial Nutrition Assessment  DOCUMENTATION CODES:   Not applicable  INTERVENTION:  Recommend initiating Osmolite 1.2 @ 20 ml/hr, advance 10 ml every 6 hrs to goal rate 50 ml/hr (1200 ml/day) Prostat 30 ml daily via tube  Tube feed regimen at goal rate 50 ml/hr (1200 ml/day) provides 1550 kcal, 82 grams protein, 912 ml free H20    Free water flushes per MD  NUTRITION DIAGNOSIS:   Inadequate oral intake related to dysphagia as evidenced by meal completion < 25%.  GOAL:   Patient will meet greater than or equal to 90% of their needs  MONITOR:   TF tolerance, Labs, Weight trends, I & O's, Skin  REASON FOR ASSESSMENT:   Consult Enteral/tube feeding initiation and management  ASSESSMENT:  58 year old male presented from [facility] with progressively worsening SOB for the past 2 days and on off fever for the last 6 days. Past medical history significant of down syndrome, paraplegia, and dysphagia. CXR showed right lower lobe pneumonia and admitted for HCAP.  Patient found to have acute hypoxemic respiratory failure and aspiration pneumonia.  11/6 MBSS -oropharyngeal dysphagia; D1:Nectar Thick  Unable to obtain nutrition history, patient with AMS due to Down Syndrome and is nonverbal. Per chart review, patient with poor oral intake, eating 0-20% of meals. Overnight patient became lethargic, diaphoretic, and hypotensive. He received fluid bolus with improvement. Patient unable to meet nutritional needs, Dobbhoff placed today in IR for initiation of tube feedings.   Plans for PEG tube placement on Monday  I/Os: -695 ml since admit         -217 ml x 24 hrs UOP: 1525 ml x 24 hrs Medications and labs reviewed  No new weights this admission, recommend obtaining current weight to fully assess needs.   NUTRITION - FOCUSED PHYSICAL EXAM: Deferred   Diet Order:   Diet Order            Diet NPO time specified  Diet effective midnight        DIET - DYS 1 Room service  appropriate? Yes with Assist; Fluid consistency: Nectar Thick  Diet effective now              EDUCATION NEEDS:   No education needs have been identified at this time  Skin:  Skin Assessment: Reviewed RN Assessment(unstageable;sacrum, stage I;healing;left;ankle)  Last BM:  11/10 (type 5; brown;large)  Height:   Ht Readings from Last 1 Encounters:  09/16/19 5\' 4"  (1.626 m)    Weight:   Wt Readings from Last 1 Encounters:  09/17/19 56.3 kg    Ideal Body Weight:  56.1 kg(Adjusted IBW for paraplegia)  BMI:  Body mass index is 21.3 kg/m.  Estimated Nutritional Needs:   Kcal:  0321-2248  Protein:  73-83  Fluid:  > 1.4 L/day   Lajuan Lines, RD, LDN Clinical Nutrition Office [phone] After Hours/Weekend Pager: [phone]

## 2019-09-24 NOTE — Progress Notes (Addendum)
Progress Note    Joseph Flores  YQI:[id number] DOB: 07/29/1961  DOA: 09/16/2019 PCP: Dianne Dun, MD      Brief Narrative:    Medical records reviewed and are as summarized below:  Joseph Flores is an 58 y.o. male with medical history significant for Down syndrome, dysphagia, who presented to the hospital from the nursing home with shortness of breath and cough.  He was found to have acute hypoxemic respiratory failure and pneumonia, probable aspiration pneumonia.      Assessment/Plan:   Principal Problem:   HCAP (healthcare-associated pneumonia) Active Problems:   Down's syndrome   Hypotension   Pressure injury of skin   Unstageable pressure ulcer of sacral region ([place])   Dysphagia   Hypoxia   Body mass index is 21.3 kg/m.   Family Communication/Anticipated D/C date and plan/Code Status   DVT prophylaxis: Lovenox Code Status: DNR Family Communication: Plan of care was discussed with his sister, Bethena Roys today. Disposition Plan: Possible discharge to SNF in 3 days  Sepsis secondary to pneumonia (probable aspiration pneumonia): Completed IV cefepime on 09/22/2019  Hypotension: Blood pressure dropped again into the 70s overnight.  BP has improved with IV fluids.  Repeat chest x-ray shows improving infiltrates in the lungs.  Etiology of recurrent hypotension is not clear at this time.  Patient will be started on midodrine to see if that will help stabilize his blood pressure.  Dysphagia/failure to thrive: Patient will be started on enteral feeding via Dobbhoff tube today.  Plan for PEG tube placement on 09/27/2019 by gastroenterologist.  Chronic anemia: Hemoglobin is stable.  No evidence of active bleeding.  Hypokalemia: Improved  AKI and hypernatremia: Resolved  Unstageable sacral decubitus ulcer: This was present on admission.  Continue local wound care  Down's syndrome: Patient is bedridden and wheelchair-bound    Subjective:   Patient is  unable to provide any history. His nurse said that he has been tolerating some of the pureed diet.  Objective:    Vitals:   09/24/19 0445 09/24/19 0501 09/24/19 0529 09/24/19 0840  BP: (!) 89/48 (!) 72/40 (!) 75/58 (!) 88/61  Pulse: 66  72 69  Resp: 16   17  Temp: (!) 97.5 F (36.4 C)   98.3 F (36.8 C)  TempSrc: Axillary   Oral  SpO2: 97%   100%  Weight:      Height:        Intake/Output Summary (Last 24 hours) at 09/24/2019 1524 Last data filed at 09/24/2019 1512 Gross per 24 hour  Intake 2082.25 ml  Output 1525 ml  Net 557.25 ml   Filed Weights   09/16/19 1237 09/17/19 0103  Weight: 54.4 kg 56.3 kg    Exam:  GEN: NAD SKIN: Scar on the left lateral malleolus of the left foot.  Unstageable sacral decubitus ulcer present on admission EYES: No pallor or icterus ENT: MMM CV: RRR PULM: CTA B ABD: soft, ND, NT, +BS CNS: Alert but nonverbal, non focal. EXT: No edema or tenderness PSYCH: Sometimes patient yells inappropriately when he is touched.    Data Reviewed:   I have personally reviewed following labs and imaging studies:  Labs: Labs show the following:   Basic Metabolic Panel: Recent Labs  Lab 09/18/19 0458 09/19/19 0552 09/20/19 0426 09/20/19 1437 09/21/19 0406 09/22/19 0500 09/24/19 0624  NA 146* 143 139  --  138 139  --   K 3.5 3.0* 3.3*  --  3.5 3.2* 4.2  CL 105  102 103  --  105 103  --   CO2 29 30 28   --  24 26  --   GLUCOSE 91 109* 93  --  84 93  --   BUN 17 15 10   --  11 11  --   CREATININE 0.86 0.83 0.81  --  0.80 0.78  --   CALCIUM 8.5* 8.1* 8.1*  --  7.8* 7.9*  --   MG  --   --  2.0 1.9  --  2.0  --    GFR Estimated Creatinine Clearance: 81.1 mL/min (by C-G formula based on SCr of 0.78 mg/dL). Liver Function Tests: No results for input(s): AST, ALT, ALKPHOS, BILITOT, PROT, ALBUMIN in the last 168 hours. No results for input(s): LIPASE, AMYLASE in the last 168 hours. No results for input(s): AMMONIA in the last 168 hours.  Coagulation profile No results for input(s): INR, PROTIME in the last 168 hours.  CBC: Recent Labs  Lab 09/18/19 0458 09/19/19 0552 09/20/19 0426 09/21/19 0406 09/22/19 0500  WBC 9.4 9.7 11.5* 11.3* 10.0  NEUTROABS 6.4 6.0  --  6.9 6.1  HGB 8.3* 8.1* 7.6* 7.6* 8.5*  HCT 26.6* 26.4* 24.7* 25.0* 25.8*  MCV 93.7 93.6 95.0 94.0 88.7  PLT 449* 410* 427* 454* 452*   Cardiac Enzymes: No results for input(s): CKTOTAL, CKMB, CKMBINDEX, TROPONINI in the last 168 hours. BNP (last 3 results) No results for input(s): PROBNP in the last 8760 hours. CBG: Recent Labs  Lab 09/24/19 0124  GLUCAP 95   D-Dimer: No results for input(s): DDIMER in the last 72 hours. Hgb A1c: No results for input(s): HGBA1C in the last 72 hours. Lipid Profile: No results for input(s): CHOL, HDL, LDLCALC, TRIG, CHOLHDL, LDLDIRECT in the last 72 hours. Thyroid function studies: No results for input(s): TSH, T4TOTAL, T3FREE, THYROIDAB in the last 72 hours.  Invalid input(s): FREET3 Anemia work up: No results for input(s): VITAMINB12, FOLATE, FERRITIN, TIBC, IRON, RETICCTPCT in the last 72 hours. Sepsis Labs: Recent Labs  Lab 09/19/19 0552 09/20/19 0426 09/21/19 0406 09/22/19 0500  PROCALCITON  --   --  <0.10 0.15  WBC 9.7 11.5* 11.3* 10.0    Microbiology Recent Results (from the past 240 hour(s))  Blood culture (routine x 2)     Status: None   Collection Time: 09/16/19 12:47 PM   Specimen: BLOOD  Result Value Ref Range Status   Specimen Description BLOOD BLOOD LEFT HAND  Final   Special Requests   Final    BOTTLES DRAWN AEROBIC AND ANAEROBIC Blood Culture adequate volume   Culture   Final    NO GROWTH 5 DAYS Performed at [facility] [address], [place], [place] [address]    Report Status 09/21/2019 FINAL  Final  Blood culture (routine x 2)     Status: None   Collection Time: 09/16/19 12:47 PM   Specimen: BLOOD  Result Value Ref Range Status   Specimen Description BLOOD  BLOOD RIGHT HAND  Final   Special Requests   Final    BOTTLES DRAWN AEROBIC AND ANAEROBIC Blood Culture adequate volume   Culture   Final    NO GROWTH 5 DAYS Performed at [facility], [address], [place], [place] [address]    Report Status 09/21/2019 FINAL  Final  SARS CORONAVIRUS 2 (TAT 6-24 HRS) Nasopharyngeal Nasopharyngeal Swab     Status: None   Collection Time: 09/16/19 12:47 PM   Specimen: Nasopharyngeal Swab  Result Value  Ref Range Status   SARS Coronavirus 2 NEGATIVE NEGATIVE Final    Comment: (NOTE) SARS-CoV-2 target nucleic acids are NOT DETECTED. The SARS-CoV-2 RNA is generally detectable in upper and lower respiratory specimens during the acute phase of infection. Negative results do not preclude SARS-CoV-2 infection, do not rule out co-infections with other pathogens, and should not be used as the sole basis for treatment or other patient management decisions. Negative results must be combined with clinical observations, patient history, and epidemiological information. The expected result is Negative. Fact Sheet for Patients: SugarRoll.be Fact Sheet for Healthcare Providers: https://www.woods-mathews.com/ This test is not yet approved or cleared by the [place] FDA and  has been authorized for detection and/or diagnosis of SARS-CoV-2 by FDA under an Emergency Use Authorization (EUA). This EUA will remain  in effect (meaning this test can be used) for the duration of the COVID-19 declaration under Section 56 4(b)(1) of the Act, 21 U.S.C. section 360bbb-3(b)(1), unless the authorization is terminated or revoked sooner. Performed at [facility], [place] [address] [place], [place] [address]   MRSA PCR Screening     Status: None   Collection Time: 09/16/19  2:30 PM   Specimen: Nasopharyngeal  Result Value Ref Range Status   MRSA by PCR NEGATIVE NEGATIVE Final    Comment:        The GeneXpert MRSA  Assay (FDA approved for NASAL specimens only), is one component of a comprehensive MRSA colonization surveillance program. It is not intended to diagnose MRSA infection nor to guide or monitor treatment for MRSA infections. Performed at [facility], [address] [place], [place] [address]     Procedures and diagnostic studies:  Dg Chest [facility] 1 View  Result Date: 09/24/2019 CLINICAL DATA:  Hypotension EXAM: PORTABLE CHEST 1 VIEW COMPARISON:  09/16/2019 FINDINGS: Stable heart size and vascularity. Improving right lower lobe airspace disease/consolidation. Right hemidiaphragm is better visualized. Small right effusion suspected blunting the right costophrenic angle. Left lung remains clear. No pneumothorax. Trachea midline. No acute osseous finding. IMPRESSION: Improving right lower lobe airspace process/consolidation. Electronically Signed   By: Jerilynn Mages.  Shick M.D.   On: 09/24/2019 08:38   Dg Addison Bailey G Tube Plc W/fl W/rad  Result Date: 09/24/2019 CLINICAL DATA:  Feeding tube placement EXAM: NASO G TUBE PLACEMENT WITH FL AND WITH RAD CONTRAST:  5 mL Omnipaque 300 iodinated contrast FLUOROSCOPY TIME:  Fluoroscopy Time:  1:18 Number of Acquired Spot Images: 1 COMPARISON:  None. FINDINGS: Weighted enteric feeding tube was placed below the diaphragm via the left nostril under fluoroscopic guidance. Due to anatomy and limited patient tolerance for the procedure, the tube could not be advanced to a post pyloric position. Intragastric position was confirmed with injection of 5 mL iodinated contrast and the tube was secured at 55 cm depth. IMPRESSION: Weighted enteric feeding tube was placed within the gastric lumen under fluoroscopic guidance. Due to anatomy and limited patient tolerance for the procedure, the tube could not be advanced to a post pyloric position. Tube secured at 55 cm depth. Electronically Signed   By: Eddie Candle M.D.   On: 09/24/2019 15:16    Medications:   . acidophilus   1 capsule Oral Daily  . Chlorhexidine Gluconate Cloth  6 each Topical Daily  . citalopram  10 mg Oral Daily  . collagenase   Topical Daily  . enoxaparin (LOVENOX) injection  40 mg Subcutaneous Q24H  . guaiFENesin  600 mg Oral Q12H  . midodrine  5  mg Oral TID WC  . tamsulosin  0.4 mg Oral QPM   Continuous Infusions: . dextrose 5 % and 0.9% NaCl 100 mL/hr at 09/24/19 0835     LOS: 8 days   Joseph Flores  [facility] Pager [phone].   *Please refer to [email or web address], password [email or web address] to get updated schedule on who will round on this patient, as hospitalists switch teams weekly. If 7PM-7AM, please contact night-coverage at [email or web address], password [email or web address] for any overnight needs.  09/24/2019, 3:24 PM

## 2019-09-25 LAB — GLUCOSE, CAPILLARY
Glucose-Capillary: 101 mg/dL — ABNORMAL HIGH (ref 70–99)
Glucose-Capillary: 107 mg/dL — ABNORMAL HIGH (ref 70–99)
Glucose-Capillary: 111 mg/dL — ABNORMAL HIGH (ref 70–99)
Glucose-Capillary: 113 mg/dL — ABNORMAL HIGH (ref 70–99)
Glucose-Capillary: 113 mg/dL — ABNORMAL HIGH (ref 70–99)
Glucose-Capillary: 118 mg/dL — ABNORMAL HIGH (ref 70–99)

## 2019-09-25 MED ORDER — FREE WATER
50.0000 mL | Freq: Four times a day (QID) | Status: DC
  Administered 2019-09-25 – 2019-09-29 (×14): 50 mL

## 2019-09-25 MED ORDER — MIDODRINE HCL 5 MG PO TABS
10.0000 mg | ORAL_TABLET | Freq: Three times a day (TID) | ORAL | Status: DC
  Administered 2019-09-25 – 2019-09-28 (×9): 10 mg via ORAL
  Filled 2019-09-25 (×9): qty 2

## 2019-09-25 NOTE — TOC Progression Note (Signed)
Transition of Care [facility]) - Progression Note    Patient Details  Name: TRENNON TORBECK MRN: [id number] Date of Birth: 1961-10-22  Transition of Care [facility]) CM/SW Contact  Katrina Stack, RN Phone Number: 09/25/2019, 5:19 PM  Clinical Narrative:   Updated attending of need to repeat covid test before discharge back to facility. last test 11/5         Expected Discharge Plan and Services                                                 Social Determinants of Health (SDOH) Interventions    Readmission Risk Interventions No flowsheet data found.

## 2019-09-25 NOTE — Progress Notes (Signed)
Progress Note    Joseph Flores  WVP:[id number] DOB: [birth year]  DOA: 09/16/2019 PCP: Dianne Dun, MD      Brief Narrative:    Medical records reviewed and are as summarized below:  Joseph Flores is an 58 y.o. male with medical history significant for Down syndrome, dysphagia, who presented to the hospital from the nursing home with shortness of breath and cough.  He was found to have acute hypoxemic respiratory failure and pneumonia, probable aspiration pneumonia.      Assessment/Plan:   Principal Problem:   HCAP (healthcare-associated pneumonia) Active Problems:   Down's syndrome   Hypotension   Pressure injury of skin   Unstageable pressure ulcer of sacral region ([place])   Dysphagia   Hypoxia   Failure to thrive (0-17)   Body mass index is 13.62 kg/m.   Family Communication/Anticipated D/C date and plan/Code Status   DVT prophylaxis: Lovenox Code Status: DNR Family Communication: Plan of care was discussed with his sister, Bethena Roys today. Disposition Plan: Possible discharge to SNF in 3 days  Sepsis secondary to pneumonia (probable aspiration pneumonia): Completed IV cefepime on 09/22/2019  Hypotension: Patient developed hypotension again midodrine has been increased from 5 mg to 10 mg 3 times daily.  Discontinue IV fluids since he is getting enteral nutrition.  Continue to monitor BP.  Dysphagia/failure to thrive: continue enteral nutrition via Dobbhoff tube.  Plan for PEG tube placement on 09/27/2019 by gastroenterologist.  Chronic anemia: Hemoglobin is stable.  No evidence of active bleeding.  Hypokalemia: Improved  AKI and hypernatremia: Resolved  Unstageable sacral decubitus ulcer: This was present on admission.  Continue local wound care  Down's syndrome: Patient is bedridden and wheelchair-bound    Subjective:   Overnight events noted.  Patient is tolerating tube feedings thus far.  He is unable to provide any history.    Objective:    Vitals:   09/24/19 2018 09/24/19 2216 09/25/19 0510 09/25/19 1133  BP: (!) 98/45 (!) 84/58 (!) 81/50 92/61  Pulse: 73 87 89 82  Resp: 18  17 18   Temp: 98.7 F (37.1 C)  98.4 F (36.9 C) 98.4 F (36.9 C)  TempSrc: Oral   Oral  SpO2: 98%  97% 97%  Weight:   47 kg 36 kg  Height:        Intake/Output Summary (Last 24 hours) at 09/25/2019 1453 Last data filed at 09/25/2019 0600 Gross per 24 hour  Intake 2901.8 ml  Output 1500 ml  Net 1401.8 ml   Filed Weights   09/17/19 0103 09/25/19 0510 09/25/19 1133  Weight: 56.3 kg 47 kg 36 kg    Exam:  GEN: NAD SKIN: Unstageable sacral decubitus ulcer, scar on left lateral malleolus of left foot. EYES: No pallor or icterus ENT: MMM, Dobbhoff tube with enteral nutrition in place CV: RRR PULM: CTA B ABD: soft, ND, NT, +BS CNS: Alert, non verbal, non focal EXT: No edema or tenderness PSYCH: Calm with intermittent agitation    Data Reviewed:   I have personally reviewed following labs and imaging studies:  Labs: Labs show the following:   Basic Metabolic Panel: Recent Labs  Lab 09/19/19 0552 09/20/19 0426 09/20/19 1437 09/21/19 0406 09/22/19 0500 09/24/19 0624  NA 143 139  --  138 139  --   K 3.0* 3.3*  --  3.5 3.2* 4.2  CL 102 103  --  105 103  --   CO2 30 28  --  24 26  --  GLUCOSE 109* 93  --  84 93  --   BUN 15 10  --  11 11  --   CREATININE 0.83 0.81  --  0.80 0.78  --   CALCIUM 8.1* 8.1*  --  7.8* 7.9*  --   MG  --  2.0 1.9  --  2.0  --    GFR Estimated Creatinine Clearance: 51.9 mL/min (by C-G formula based on SCr of 0.78 mg/dL). Liver Function Tests: No results for input(s): AST, ALT, ALKPHOS, BILITOT, PROT, ALBUMIN in the last 168 hours. No results for input(s): LIPASE, AMYLASE in the last 168 hours. No results for input(s): AMMONIA in the last 168 hours. Coagulation profile No results for input(s): INR, PROTIME in the last 168 hours.  CBC: Recent Labs  Lab 09/19/19 0552 09/20/19  0426 09/21/19 0406 09/22/19 0500  WBC 9.7 11.5* 11.3* 10.0  NEUTROABS 6.0  --  6.9 6.1  HGB 8.1* 7.6* 7.6* 8.5*  HCT 26.4* 24.7* 25.0* 25.8*  MCV 93.6 95.0 94.0 88.7  PLT 410* 427* 454* 452*   Cardiac Enzymes: No results for input(s): CKTOTAL, CKMB, CKMBINDEX, TROPONINI in the last 168 hours. BNP (last 3 results) No results for input(s): PROBNP in the last 8760 hours. CBG: Recent Labs  Lab 09/24/19 2052 09/25/19 0007 09/25/19 0419 09/25/19 0736 09/25/19 1154  GLUCAP 94 118* 107* 113* 101*   D-Dimer: No results for input(s): DDIMER in the last 72 hours. Hgb A1c: No results for input(s): HGBA1C in the last 72 hours. Lipid Profile: No results for input(s): CHOL, HDL, LDLCALC, TRIG, CHOLHDL, LDLDIRECT in the last 72 hours. Thyroid function studies: No results for input(s): TSH, T4TOTAL, T3FREE, THYROIDAB in the last 72 hours.  Invalid input(s): FREET3 Anemia work up: No results for input(s): VITAMINB12, FOLATE, FERRITIN, TIBC, IRON, RETICCTPCT in the last 72 hours. Sepsis Labs: Recent Labs  Lab 09/19/19 0552 09/20/19 0426 09/21/19 0406 09/22/19 0500  PROCALCITON  --   --  <0.10 0.15  WBC 9.7 11.5* 11.3* 10.0    Microbiology Recent Results (from the past 240 hour(s))  Blood culture (routine x 2)     Status: None   Collection Time: 09/16/19 12:47 PM   Specimen: BLOOD  Result Value Ref Range Status   Specimen Description BLOOD BLOOD LEFT HAND  Final   Special Requests   Final    BOTTLES DRAWN AEROBIC AND ANAEROBIC Blood Culture adequate volume   Culture   Final    NO GROWTH 5 DAYS Performed at [facility], [address], [place], [place] [address]    Report Status 09/21/2019 FINAL  Final  Blood culture (routine x 2)     Status: None   Collection Time: 09/16/19 12:47 PM   Specimen: BLOOD  Result Value Ref Range Status   Specimen Description BLOOD BLOOD RIGHT HAND  Final   Special Requests   Final    BOTTLES DRAWN AEROBIC AND ANAEROBIC Blood  Culture adequate volume   Culture   Final    NO GROWTH 5 DAYS Performed at [facility], [address], [place], [place] [address]    Report Status 09/21/2019 FINAL  Final  SARS CORONAVIRUS 2 (TAT 6-24 HRS) Nasopharyngeal Nasopharyngeal Swab     Status: None   Collection Time: 09/16/19 12:47 PM   Specimen: Nasopharyngeal Swab  Result Value Ref Range Status   SARS Coronavirus 2 NEGATIVE NEGATIVE Final    Comment: (NOTE) SARS-CoV-2 target nucleic acids are NOT DETECTED. The SARS-CoV-2 RNA is  generally detectable in upper and lower respiratory specimens during the acute phase of infection. Negative results do not preclude SARS-CoV-2 infection, do not rule out co-infections with other pathogens, and should not be used as the sole basis for treatment or other patient management decisions. Negative results must be combined with clinical observations, patient history, and epidemiological information. The expected result is Negative. Fact Sheet for Patients: SugarRoll.be Fact Sheet for Healthcare Providers: https://www.woods-mathews.com/ This test is not yet approved or cleared by the [place] FDA and  has been authorized for detection and/or diagnosis of SARS-CoV-2 by FDA under an Emergency Use Authorization (EUA). This EUA will remain  in effect (meaning this test can be used) for the duration of the COVID-19 declaration under Section 56 4(b)(1) of the Act, 21 U.S.C. section 360bbb-3(b)(1), unless the authorization is terminated or revoked sooner. Performed at [facility], [place] [address] [place], [place] [address]   MRSA PCR Screening     Status: None   Collection Time: 09/16/19  2:30 PM   Specimen: Nasopharyngeal  Result Value Ref Range Status   MRSA by PCR NEGATIVE NEGATIVE Final    Comment:        The GeneXpert MRSA Assay (FDA approved for NASAL specimens only), is one component of a comprehensive MRSA  colonization surveillance program. It is not intended to diagnose MRSA infection nor to guide or monitor treatment for MRSA infections. Performed at [facility], [address] [place], [place] [address]     Procedures and diagnostic studies:  Dg Chest [facility] 1 View  Result Date: 09/24/2019 CLINICAL DATA:  Hypotension EXAM: PORTABLE CHEST 1 VIEW COMPARISON:  09/16/2019 FINDINGS: Stable heart size and vascularity. Improving right lower lobe airspace disease/consolidation. Right hemidiaphragm is better visualized. Small right effusion suspected blunting the right costophrenic angle. Left lung remains clear. No pneumothorax. Trachea midline. No acute osseous finding. IMPRESSION: Improving right lower lobe airspace process/consolidation. Electronically Signed   By: Jerilynn Mages.  Shick M.D.   On: 09/24/2019 08:38   Dg Addison Bailey G Tube Plc W/fl W/rad  Result Date: 09/24/2019 CLINICAL DATA:  Feeding tube placement EXAM: NASO G TUBE PLACEMENT WITH FL AND WITH RAD CONTRAST:  5 mL Omnipaque 300 iodinated contrast FLUOROSCOPY TIME:  Fluoroscopy Time:  1:18 Number of Acquired Spot Images: 1 COMPARISON:  None. FINDINGS: Weighted enteric feeding tube was placed below the diaphragm via the left nostril under fluoroscopic guidance. Due to anatomy and limited patient tolerance for the procedure, the tube could not be advanced to a post pyloric position. Intragastric position was confirmed with injection of 5 mL iodinated contrast and the tube was secured at 55 cm depth. IMPRESSION: Weighted enteric feeding tube was placed within the gastric lumen under fluoroscopic guidance. Due to anatomy and limited patient tolerance for the procedure, the tube could not be advanced to a post pyloric position. Tube secured at 55 cm depth. Electronically Signed   By: Eddie Candle M.D.   On: 09/24/2019 15:16    Medications:   . acidophilus  1 capsule Oral Daily  . Chlorhexidine Gluconate Cloth  6 each Topical Daily  .  citalopram  10 mg Oral Daily  . collagenase   Topical Daily  . enoxaparin (LOVENOX) injection  40 mg Subcutaneous Q24H  . feeding supplement (PRO-STAT SUGAR FREE 64)  30 mL Per Tube Daily  . free water  50 mL Per Tube Q6H  . midodrine  10 mg Oral TID WC  . tamsulosin  0.4 mg Oral  QPM   Continuous Infusions: . feeding supplement (OSMOLITE 1.2 CAL) 50 mL/hr at 09/25/19 0500     LOS: 9 days   Hanni Milford  [facility] Pager [phone].   *Please refer to [email or web address], password [email or web address] to get updated schedule on who will round on this patient, as hospitalists switch teams weekly. If 7PM-7AM, please contact night-coverage at [email or web address], password [email or web address] for any overnight needs.  09/25/2019, 2:53 PM

## 2019-09-26 DIAGNOSIS — J189 Pneumonia, unspecified organism: Secondary | ICD-10-CM

## 2019-09-26 DIAGNOSIS — R1310 Dysphagia, unspecified: Secondary | ICD-10-CM

## 2019-09-26 LAB — GLUCOSE, CAPILLARY
Glucose-Capillary: 103 mg/dL — ABNORMAL HIGH (ref 70–99)
Glucose-Capillary: 108 mg/dL — ABNORMAL HIGH (ref 70–99)
Glucose-Capillary: 112 mg/dL — ABNORMAL HIGH (ref 70–99)
Glucose-Capillary: 130 mg/dL — ABNORMAL HIGH (ref 70–99)
Glucose-Capillary: 98 mg/dL (ref 70–99)
Glucose-Capillary: 98 mg/dL (ref 70–99)

## 2019-09-26 NOTE — Progress Notes (Signed)
Progress Note    PIPER ALBRO  UQJ:[id number] DOB: [birth year]  DOA: 09/16/2019 PCP: Dianne Dun, MD      Brief Narrative:    Medical records reviewed and are as summarized below:  ZEPHYR RIDLEY is an 58 y.o. male with medical history significant for Down syndrome, dysphagia, who presented to the hospital from the nursing home with shortness of breath and cough.  He was found to have acute hypoxemic respiratory failure and pneumonia, probable aspiration pneumonia.      Assessment/Plan:   Principal Problem:   HCAP (healthcare-associated pneumonia) Active Problems:   Down's syndrome   Hypotension   Pressure injury of skin   Unstageable pressure ulcer of sacral region ([place])   Dysphagia   Hypoxia   Failure to thrive (0-17)   Body mass index is 17.79 kg/m.   Family Communication/Anticipated D/C date and plan/Code Status   DVT prophylaxis: Lovenox Code Status: DNR Family Communication: Plan of care was discussed with Bethena Roys, his sister. Disposition Plan: Possible discharge to SNF in 2 days  Sepsis secondary to pneumonia (probable aspiration pneumonia): Completed IV cefepime on 09/22/2019  Hypotension: BP has improved with midodrine.   Dysphagia/failure to thrive: continue enteral nutrition via Dobbhoff tube.  Plan for PEG tube placement tomorrow. Chronic anemia: Hemoglobin is stable.  No evidence of active bleeding.  Hypokalemia: Improved  AKI and hypernatremia: Resolved  Unstageable sacral decubitus ulcer: This was present on admission.  Continue local wound care.   Down's syndrome: Patient is bedridden and wheelchair-bound    Subjective:   No acute issues overnight  Objective:    Vitals:   09/26/19 0353 09/26/19 0500 09/26/19 0557 09/26/19 0926  BP: 104/67   100/66  Pulse: 79   73  Resp: 18   19  Temp: 100.3 F (37.9 C)  97.8 F (36.6 C) 98.6 F (37 C)  TempSrc: Axillary  Axillary Oral  SpO2: 98%   97%  Weight:  47 kg     Height:        Intake/Output Summary (Last 24 hours) at 09/26/2019 1349 Last data filed at 09/26/2019 1307 Gross per 24 hour  Intake 1656 ml  Output 1850 ml  Net -194 ml   Filed Weights   09/25/19 0510 09/25/19 1133 09/26/19 0500  Weight: 47 kg 36 kg 47 kg    Exam:   GEN: NAD SKIN: Unstageable sacral decubitus ulcer EYES: no pallor or icterus ENT: MMM CV: RRR PULM: CTA B ABD: soft, ND, NT, +BS CNS: AAO x 3, non focal EXT: No edema or tenderness PSYCH: calm     Data Reviewed:   I have personally reviewed following labs and imaging studies:  Labs: Labs show the following:   Basic Metabolic Panel: Recent Labs  Lab 09/20/19 0426 09/20/19 1437 09/21/19 0406 09/22/19 0500 09/24/19 0624  NA 139  --  138 139  --   K 3.3*  --  3.5 3.2* 4.2  CL 103  --  105 103  --   CO2 28  --  24 26  --   GLUCOSE 93  --  84 93  --   BUN 10  --  11 11  --   CREATININE 0.81  --  0.80 0.78  --   CALCIUM 8.1*  --  7.8* 7.9*  --   MG 2.0 1.9  --  2.0  --    GFR Estimated Creatinine Clearance: 67.7 mL/min (by C-G formula based on SCr of 0.78  mg/dL). Liver Function Tests: No results for input(s): AST, ALT, ALKPHOS, BILITOT, PROT, ALBUMIN in the last 168 hours. No results for input(s): LIPASE, AMYLASE in the last 168 hours. No results for input(s): AMMONIA in the last 168 hours. Coagulation profile No results for input(s): INR, PROTIME in the last 168 hours.  CBC: Recent Labs  Lab 09/20/19 0426 09/21/19 0406 09/22/19 0500  WBC 11.5* 11.3* 10.0  NEUTROABS  --  6.9 6.1  HGB 7.6* 7.6* 8.5*  HCT 24.7* 25.0* 25.8*  MCV 95.0 94.0 88.7  PLT 427* 454* 452*   Cardiac Enzymes: No results for input(s): CKTOTAL, CKMB, CKMBINDEX, TROPONINI in the last 168 hours. BNP (last 3 results) No results for input(s): PROBNP in the last 8760 hours. CBG: Recent Labs  Lab 09/25/19 2100 09/26/19 0048 09/26/19 0409 09/26/19 0750 09/26/19 1159  GLUCAP 111* 108* 112* 103* 98   D-Dimer:  No results for input(s): DDIMER in the last 72 hours. Hgb A1c: No results for input(s): HGBA1C in the last 72 hours. Lipid Profile: No results for input(s): CHOL, HDL, LDLCALC, TRIG, CHOLHDL, LDLDIRECT in the last 72 hours. Thyroid function studies: No results for input(s): TSH, T4TOTAL, T3FREE, THYROIDAB in the last 72 hours.  Invalid input(s): FREET3 Anemia work up: No results for input(s): VITAMINB12, FOLATE, FERRITIN, TIBC, IRON, RETICCTPCT in the last 72 hours. Sepsis Labs: Recent Labs  Lab 09/20/19 0426 09/21/19 0406 09/22/19 0500  PROCALCITON  --  <0.10 0.15  WBC 11.5* 11.3* 10.0    Microbiology Recent Results (from the past 240 hour(s))  MRSA PCR Screening     Status: None   Collection Time: 09/16/19  2:30 PM   Specimen: Nasopharyngeal  Result Value Ref Range Status   MRSA by PCR NEGATIVE NEGATIVE Final    Comment:        The GeneXpert MRSA Assay (FDA approved for NASAL specimens only), is one component of a comprehensive MRSA colonization surveillance program. It is not intended to diagnose MRSA infection nor to guide or monitor treatment for MRSA infections. Performed at [facility], [address] [place], [place] [address]     Procedures and diagnostic studies:  Dg Loyce Dys Tube Plc W/fl W/rad  Result Date: 09/24/2019 CLINICAL DATA:  Feeding tube placement EXAM: NASO G TUBE PLACEMENT WITH FL AND WITH RAD CONTRAST:  5 mL Omnipaque 300 iodinated contrast FLUOROSCOPY TIME:  Fluoroscopy Time:  1:18 Number of Acquired Spot Images: 1 COMPARISON:  None. FINDINGS: Weighted enteric feeding tube was placed below the diaphragm via the left nostril under fluoroscopic guidance. Due to anatomy and limited patient tolerance for the procedure, the tube could not be advanced to a post pyloric position. Intragastric position was confirmed with injection of 5 mL iodinated contrast and the tube was secured at 55 cm depth. IMPRESSION: Weighted enteric feeding tube was  placed within the gastric lumen under fluoroscopic guidance. Due to anatomy and limited patient tolerance for the procedure, the tube could not be advanced to a post pyloric position. Tube secured at 55 cm depth. Electronically Signed   By: Eddie Candle M.D.   On: 09/24/2019 15:16    Medications:   . acidophilus  1 capsule Oral Daily  . Chlorhexidine Gluconate Cloth  6 each Topical Daily  . citalopram  10 mg Oral Daily  . collagenase   Topical Daily  . enoxaparin (LOVENOX) injection  40 mg Subcutaneous Q24H  . feeding supplement (PRO-STAT SUGAR FREE 64)  30 mL Per Tube Daily  .  free water  50 mL Per Tube Q6H  . midodrine  10 mg Oral TID WC  . tamsulosin  0.4 mg Oral QPM   Continuous Infusions: . feeding supplement (OSMOLITE 1.2 CAL) 50 mL/hr at 09/26/19 1307     LOS: 10 days   Marlissa Emerick  [facility] Pager [phone].   *Please refer to [email or web address], password [email or web address] to get updated schedule on who will round on this patient, as hospitalists switch teams weekly. If 7PM-7AM, please contact night-coverage at [email or web address], password [email or web address] for any overnight needs.  09/26/2019, 1:49 PM

## 2019-09-26 NOTE — Progress Notes (Signed)
Patient dislodged Dobhoff NG tube. MD notified. No new orders at this time.

## 2019-09-26 NOTE — Progress Notes (Signed)
Joseph Antigua, MD [address], [place], [place], [place], [address] [address], [place], [place], [place], [address] Phone: [phone]  Fax: [phone]   Subjective: Patient laying in bed and comfortable on room air.  Dobbhoff tube in place with feeds ongoing   Objective: Exam: Vital signs in last 24 hours: Vitals:   09/25/19 1946 09/26/19 0353 09/26/19 0500 09/26/19 0557  BP: 92/60 104/67    Pulse: 66 79    Resp: 15 18    Temp: 98.9 F (37.2 C) 100.3 F (37.9 C)  97.8 F (36.6 C)  TempSrc: Axillary Axillary  Axillary  SpO2: 98% 98%    Weight:   47 kg   Height:       Weight change: -11 kg  Intake/Output Summary (Last 24 hours) at 09/26/2019 0847 Last data filed at 09/26/2019 2751 Gross per 24 hour  Intake 1278 ml  Output 1850 ml  Net -572 ml    General: No acute distress Abd: Soft, NT/ND, No HSM Skin: Warm, no rashes Neck: Supple, Trachea midline   Lab Results: Lab Results  Component Value Date   WBC 10.0 09/22/2019   HGB 8.5 (L) 09/22/2019   HCT 25.8 (L) 09/22/2019   MCV 88.7 09/22/2019   PLT 452 (H) 09/22/2019   Micro Results: Recent Results (from the past 240 hour(s))  Blood culture (routine x 2)     Status: None   Collection Time: 09/16/19 12:47 PM   Specimen: BLOOD  Result Value Ref Range Status   Specimen Description BLOOD BLOOD LEFT HAND  Final   Special Requests   Final    BOTTLES DRAWN AEROBIC AND ANAEROBIC Blood Culture adequate volume   Culture   Final    NO GROWTH 5 DAYS Performed at [facility], [address] [place], [place] [address]    Report Status 09/21/2019 FINAL  Final  Blood culture (routine x 2)     Status: None   Collection Time: 09/16/19 12:47 PM   Specimen: BLOOD  Result Value Ref Range Status   Specimen Description BLOOD BLOOD RIGHT HAND  Final   Special Requests   Final    BOTTLES DRAWN AEROBIC AND ANAEROBIC Blood Culture adequate volume   Culture   Final    NO GROWTH 5 DAYS Performed  at [facility], [address] [place], [place] [address]    Report Status 09/21/2019 FINAL  Final  SARS CORONAVIRUS 2 (TAT 6-24 HRS) Nasopharyngeal Nasopharyngeal Swab     Status: None   Collection Time: 09/16/19 12:47 PM   Specimen: Nasopharyngeal Swab  Result Value Ref Range Status   SARS Coronavirus 2 NEGATIVE NEGATIVE Final    Comment: (NOTE) SARS-CoV-2 target nucleic acids are NOT DETECTED. The SARS-CoV-2 RNA is generally detectable in upper and lower respiratory specimens during the acute phase of infection. Negative results do not preclude SARS-CoV-2 infection, do not rule out co-infections with other pathogens, and should not be used as the sole basis for treatment or other patient management decisions. Negative results must be combined with clinical observations, patient history, and epidemiological information. The expected result is Negative. Fact Sheet for Patients: SugarRoll.be Fact Sheet for Healthcare Providers: https://www.woods-mathews.com/ This test is not yet approved or cleared by the [place] FDA and  has been authorized for detection and/or diagnosis of SARS-CoV-2 by FDA under an Emergency Use Authorization (EUA). This EUA will remain  in effect (meaning this test can be used) for the duration of the COVID-19 declaration  under Section 56 4(b)(1) of the Act, 21 U.S.C. section 360bbb-3(b)(1), unless the authorization is terminated or revoked sooner. Performed at [facility], [place] [address], [place], [place] [address]   MRSA PCR Screening     Status: None   Collection Time: 09/16/19  2:30 PM   Specimen: Nasopharyngeal  Result Value Ref Range Status   MRSA by PCR NEGATIVE NEGATIVE Final    Comment:        The GeneXpert MRSA Assay (FDA approved for NASAL specimens only), is one component of a comprehensive MRSA colonization surveillance program. It is not intended to diagnose MRSA infection  nor to guide or monitor treatment for MRSA infections. Performed at [facility], [address] [place], [place] [address]    Studies/Results: Dg Loyce Dys Tube Plc W/fl W/rad  Result Date: 09/24/2019 CLINICAL DATA:  Feeding tube placement EXAM: NASO G TUBE PLACEMENT WITH FL AND WITH RAD CONTRAST:  5 mL Omnipaque 300 iodinated contrast FLUOROSCOPY TIME:  Fluoroscopy Time:  1:18 Number of Acquired Spot Images: 1 COMPARISON:  None. FINDINGS: Weighted enteric feeding tube was placed below the diaphragm via the left nostril under fluoroscopic guidance. Due to anatomy and limited patient tolerance for the procedure, the tube could not be advanced to a post pyloric position. Intragastric position was confirmed with injection of 5 mL iodinated contrast and the tube was secured at 55 cm depth. IMPRESSION: Weighted enteric feeding tube was placed within the gastric lumen under fluoroscopic guidance. Due to anatomy and limited patient tolerance for the procedure, the tube could not be advanced to a post pyloric position. Tube secured at 55 cm depth. Electronically Signed   By: Eddie Candle M.D.   On: 09/24/2019 15:16   Medications:  Scheduled Meds: . acidophilus  1 capsule Oral Daily  . Chlorhexidine Gluconate Cloth  6 each Topical Daily  . citalopram  10 mg Oral Daily  . collagenase   Topical Daily  . enoxaparin (LOVENOX) injection  40 mg Subcutaneous Q24H  . feeding supplement (PRO-STAT SUGAR FREE 64)  30 mL Per Tube Daily  . free water  50 mL Per Tube Q6H  . midodrine  10 mg Oral TID WC  . tamsulosin  0.4 mg Oral QPM   Continuous Infusions: . feeding supplement (OSMOLITE 1.2 CAL) 50 mL/hr at 09/25/19 0500   PRN Meds:.acetaminophen, ipratropium-albuterol, ondansetron (ZOFRAN) IV, oxyCODONE   Assessment: Principal Problem:   HCAP (healthcare-associated pneumonia) Active Problems:   Down's syndrome   Hypotension   Pressure injury of skin   Unstageable pressure ulcer of sacral  region ([place])   Dysphagia   Hypoxia   Failure to thrive (0-17)    Plan: I have been asked to see this patient over the weekend by Dr. Alice Reichert  Patient has been planned to have PEG tube placement by Dr. Alice Reichert this week  Patient is hemodynamically stable, and pulse ox is 97 on room air  Patient is n.p.o. past midnight for PEG tube tomorrow  Continue plan as per Dr. Alice Reichert to proceed with PEG tube placement as planned tomorrow or this week as per his schedule  Dr. Alice Reichert will resume care of the patient tomorrow   LOS: 10 days   Joseph Antigua, MD 09/26/2019, 8:47 AM

## 2019-09-27 ENCOUNTER — Encounter: Payer: Self-pay | Admitting: Anesthesiology

## 2019-09-27 ENCOUNTER — Encounter
Admission: EM | Disposition: A | Discharge status: Skilled Nursing Facility | Payer: Self-pay | Source: Skilled Nursing Facility | Attending: Internal Medicine

## 2019-09-27 ENCOUNTER — Inpatient Hospital Stay: Payer: Medicare Other

## 2019-09-27 ENCOUNTER — Inpatient Hospital Stay: Payer: Medicare Other | Admitting: Anesthesiology

## 2019-09-27 HISTORY — PX: PEG PLACEMENT: SHX5437

## 2019-09-27 LAB — GLUCOSE, CAPILLARY
Glucose-Capillary: 139 mg/dL — ABNORMAL HIGH (ref 70–99)
Glucose-Capillary: 80 mg/dL (ref 70–99)
Glucose-Capillary: 81 mg/dL (ref 70–99)
Glucose-Capillary: 83 mg/dL (ref 70–99)
Glucose-Capillary: 85 mg/dL (ref 70–99)
Glucose-Capillary: 86 mg/dL (ref 70–99)

## 2019-09-27 SURGERY — INSERTION, PEG TUBE
Anesthesia: General

## 2019-09-27 MED ORDER — PROPOFOL 10 MG/ML IV BOLUS
INTRAVENOUS | Status: DC | PRN
  Administered 2019-09-27 (×2): 30 mg via INTRAVENOUS
  Administered 2019-09-27: 20 mg via INTRAVENOUS

## 2019-09-27 MED ORDER — DEXTROSE 50 % IV SOLN
12.5000 g | INTRAVENOUS | Status: AC
  Administered 2019-09-27: 12.5 g via INTRAVENOUS
  Filled 2019-09-27: qty 50

## 2019-09-27 MED ORDER — ACETAMINOPHEN 650 MG RE SUPP
650.0000 mg | Freq: Four times a day (QID) | RECTAL | Status: DC | PRN
  Administered 2019-09-27 – 2019-09-28 (×2): 650 mg via RECTAL
  Filled 2019-09-27 (×2): qty 1

## 2019-09-27 MED ORDER — SODIUM CHLORIDE 0.9 % IV BOLUS
250.0000 mL | Freq: Once | INTRAVENOUS | Status: AC
  Administered 2019-09-27: 250 mL via INTRAVENOUS

## 2019-09-27 MED ORDER — CEFAZOLIN SODIUM-DEXTROSE 2-4 GM/100ML-% IV SOLN
2.0000 g | INTRAVENOUS | Status: AC
  Administered 2019-09-27: 2 g via INTRAVENOUS
  Filled 2019-09-27: qty 100

## 2019-09-27 MED ORDER — LIDOCAINE HCL (PF) 2 % IJ SOLN
INTRAMUSCULAR | Status: AC
  Filled 2019-09-27: qty 10

## 2019-09-27 MED ORDER — CEFAZOLIN SODIUM-DEXTROSE 2-4 GM/100ML-% IV SOLN
INTRAVENOUS | Status: AC
  Filled 2019-09-27: qty 100

## 2019-09-27 MED ORDER — PHENYLEPHRINE HCL (PRESSORS) 10 MG/ML IV SOLN
INTRAVENOUS | Status: DC | PRN
  Administered 2019-09-27: 200 ug via INTRAVENOUS
  Administered 2019-09-27 (×5): 100 ug via INTRAVENOUS

## 2019-09-27 MED ORDER — PROPOFOL 500 MG/50ML IV EMUL
INTRAVENOUS | Status: DC | PRN
  Administered 2019-09-27: 120 ug/kg/min via INTRAVENOUS

## 2019-09-27 MED ORDER — PROPOFOL 10 MG/ML IV BOLUS
INTRAVENOUS | Status: AC
  Filled 2019-09-27: qty 20

## 2019-09-27 MED ORDER — SODIUM CHLORIDE 0.9 % IV SOLN
INTRAVENOUS | Status: DC
  Administered 2019-09-27: 09:00:00 via INTRAVENOUS

## 2019-09-27 NOTE — H&P (View-Only) (Signed)
GI Inpatient Follow-up Note  Subjective:  Patient seen laying in bed and comfortable on room air. No acute events overnight. He has been NPO for PEG tube placement planned for this afternoon, but received his midodrine with applesauce. Patient has been wearing mitts and pulled out the Dobhoff tube yesterday. There appears to be no complaint of abdominal pain or discomfort.  Scheduled Inpatient Medications:  . acidophilus  1 capsule Oral Daily  . Chlorhexidine Gluconate Cloth  6 each Topical Daily  . citalopram  10 mg Oral Daily  . collagenase   Topical Daily  . enoxaparin (LOVENOX) injection  40 mg Subcutaneous Q24H  . feeding supplement (PRO-STAT SUGAR FREE 64)  30 mL Per Tube Daily  . free water  50 mL Per Tube Q6H  . midodrine  10 mg Oral TID WC  . tamsulosin  0.4 mg Oral QPM    Continuous Inpatient Infusions:   . sodium chloride 20 mL/hr at 09/27/19 1216  .  ceFAZolin (ANCEF) IV    . feeding supplement (OSMOLITE 1.2 CAL) Stopped (09/26/19 1856)    PRN Inpatient Medications:  acetaminophen, acetaminophen, ipratropium-albuterol, ondansetron (ZOFRAN) IV, oxyCODONE  Review of Systems:  Unable to tolerate due to patient's baseline status  Physical Examination: BP (!) 92/52 (BP Location: Right Arm)   Pulse 80   Temp 98.9 F (37.2 C) (Axillary)   Resp 15   Ht 5\' 4"  (1.626 m)   Wt 52 kg   SpO2 96%   BMI 19.68 kg/m  Gen: NAD, alert  HEENT: PEERLA, EOMI, Neck: supple, no JVD or thyromegaly Chest: CTA bilaterally, no wheezes, crackles, or other adventitious sounds CV: RRR, no m/g/c/r Abd: soft, NT, ND, +BS in all four quadrants; no HSM, guarding, ridigity, or rebound tenderness Ext: no edema, well perfused with 2+ pulses, Skin: no rash or lesions noted Lymph: no LAD  Data: Lab Results  Component Value Date   WBC 10.0 09/22/2019   HGB 8.5 (L) 09/22/2019   HCT 25.8 (L) 09/22/2019   MCV 88.7 09/22/2019   PLT 452 (H) 09/22/2019   Recent Labs  Lab 09/21/19 0406  09/22/19 0500  HGB 7.6* 8.5*   Lab Results  Component Value Date   NA 139 09/22/2019   K 4.2 09/24/2019   CL 103 09/22/2019   CO2 26 09/22/2019   BUN 11 09/22/2019   CREATININE 0.78 09/22/2019   Lab Results  Component Value Date   ALT 15 09/17/2019   AST 20 09/17/2019   ALKPHOS 47 09/17/2019   BILITOT 0.6 09/17/2019   No results for input(s): APTT, INR, PTT in the last 168 hours. Assessment/Plan:  Mr.Dickersonis a 58 y.o.malewith a PMH of Down's syndrome with declining cognitive status, DDD, arthritis admitted to the hospital for aspiration pneumonia  1. Recurrent aspiration pneumonia 2. Oropharyngeal dysphagia 3. Declining cognitive status 4. Failure to thrive  Recommendations:  1. PEG Tube procedure was thought to be canceled today, but anesthesia agreed to proceed at 5pm, later due to patient eating applesauce this AM. 2. Will leave DHT out. 3. Continuing to hold TF. Will plan to resume after PEG tube is placed.  4. Restraints needed in order to keep patient from pulling tube. Discussed with Dr. Mal Misty. I also discussed the need for patient to have restraints to patient's sister, Selinda Michaels, who is aware and agreeable. 5. PEG Tube Placement today with Dr. Alice Reichert (Patient needs PEG today per Dr. Mal Misty, will attempt this afternoon around 5 pm) - TKT. 6. He will  need to be strictly NPO now. Discussed case with attending MD DR. Mal Misty and RN M. Clarise Cruz.  Procedure again discussed with the patient's POA and closest relative, Selinda Michaels. Mrs. Lorel Monaco understands the nature of the planned procedure, indications, risks, alternatives and potential complications including but not limited to bleeding, infection, perforation, damage to internal organs and possible oversedation/side effects from anesthesia. The patient's sister, Selinda Michaels, agrees and gives consent to proceed.  Please refer to procedure notes for findings, recommendations and patient  disposition/instructions.   Please call with questions or concerns.    Octavia Bruckner, PA-C [facility] [phone] (Cell)

## 2019-09-27 NOTE — Progress Notes (Signed)
Progress Note    IKEEM CLECKLER  WCB:[id number] DOB: Jan 08, 1961  DOA: 09/16/2019 PCP: Dianne Dun, MD      Brief Narrative:    Medical records reviewed and are as summarized below:  BREVIN MCFADDEN is an 58 y.o. male with medical history significant for Down syndrome, dysphagia, who presented to the hospital from the nursing home with shortness of breath and cough.  He was found to have acute hypoxemic respiratory failure and pneumonia, probable aspiration pneumonia.      Assessment/Plan:   Principal Problem:   HCAP (healthcare-associated pneumonia) Active Problems:   Down's syndrome   Hypotension   Pressure injury of skin   Unstageable pressure ulcer of sacral region ([place])   Dysphagia   Hypoxia   Failure to thrive (0-17)   Body mass index is 19.68 kg/m.   Family Communication/Anticipated D/C date and plan/Code Status   DVT prophylaxis: Lovenox Code Status: DNR Family Communication: Plan of care was discussed with Bethena Roys, his sister. Disposition Plan: Possible discharge to SNF in 1 to 2 days  Sepsis secondary to pneumonia (probable aspiration pneumonia): Completed IV cefepime on 09/22/2019  Hypotension: He has had intermittent episodes of hypotension.  Continue midodrine.  Dysphagia/failure to thrive: Patient is n.p.o. with plan for PEG tube placement today.    Chronic anemia: Hemoglobin is stable.  No evidence of active bleeding.  Hypokalemia: Improved  AKI and hypernatremia: Resolved  Unstageable sacral decubitus ulcer: This was present on admission.  Continue local wound care.   Down's syndrome: Patient is bedridden and wheelchair-bound    Subjective:   He has had intermittent episodes of hypotension.  Dobbhoff tube was dislodged yesterday evening and it was noted replaced because he was going to be n.p.o. for PEG tube placement.  Objective:    Vitals:   09/27/19 0422 09/27/19 0816 09/27/19 1105 09/27/19 1108  BP:  (!) 88/57 (!)  82/65 (!) 92/52  Pulse:  95 82 80  Resp:  15    Temp:  99.2 F (37.3 C) 98.9 F (37.2 C)   TempSrc:  Axillary Axillary   SpO2:  96%    Weight: 52 kg     Height:        Intake/Output Summary (Last 24 hours) at 09/27/2019 1517 Last data filed at 09/27/2019 1216 Gross per 24 hour  Intake 67.48 ml  Output 500 ml  Net -432.52 ml   Filed Weights   09/25/19 1133 09/26/19 0500 09/27/19 0422  Weight: 36 kg 47 kg 52 kg    Exam:   GEN: NAD SKIN: Unstageable sacral decubitus ulcer present on admission EYES: no abnormality noted ENT: MMM CV: RRR PULM: CTA B ABD: soft, ND, NT, +BS CNS: AAO x 3, non focal EXT: No edema or tenderness PSYCH: calm at this time     Data Reviewed:   I have personally reviewed following labs and imaging studies:  Labs: Labs show the following:   Basic Metabolic Panel: Recent Labs  Lab 09/21/19 0406 09/22/19 0500 09/24/19 0624  NA 138 139  --   K 3.5 3.2* 4.2  CL 105 103  --   CO2 24 26  --   GLUCOSE 84 93  --   BUN 11 11  --   CREATININE 0.80 0.78  --   CALCIUM 7.8* 7.9*  --   MG  --  2.0  --    GFR Estimated Creatinine Clearance: 74.9 mL/min (by C-G formula based on SCr of 0.78 mg/dL).  Liver Function Tests: No results for input(s): AST, ALT, ALKPHOS, BILITOT, PROT, ALBUMIN in the last 168 hours. No results for input(s): LIPASE, AMYLASE in the last 168 hours. No results for input(s): AMMONIA in the last 168 hours. Coagulation profile No results for input(s): INR, PROTIME in the last 168 hours.  CBC: Recent Labs  Lab 09/21/19 0406 09/22/19 0500  WBC 11.3* 10.0  NEUTROABS 6.9 6.1  HGB 7.6* 8.5*  HCT 25.0* 25.8*  MCV 94.0 88.7  PLT 454* 452*   Cardiac Enzymes: No results for input(s): CKTOTAL, CKMB, CKMBINDEX, TROPONINI in the last 168 hours. BNP (last 3 results) No results for input(s): PROBNP in the last 8760 hours. CBG: Recent Labs  Lab 09/26/19 2134 09/27/19 0044 09/27/19 0204 09/27/19 0814 09/27/19 1225   GLUCAP 98 81 139* 85 86   D-Dimer: No results for input(s): DDIMER in the last 72 hours. Hgb A1c: No results for input(s): HGBA1C in the last 72 hours. Lipid Profile: No results for input(s): CHOL, HDL, LDLCALC, TRIG, CHOLHDL, LDLDIRECT in the last 72 hours. Thyroid function studies: No results for input(s): TSH, T4TOTAL, T3FREE, THYROIDAB in the last 72 hours.  Invalid input(s): FREET3 Anemia work up: No results for input(s): VITAMINB12, FOLATE, FERRITIN, TIBC, IRON, RETICCTPCT in the last 72 hours. Sepsis Labs: Recent Labs  Lab 09/21/19 0406 09/22/19 0500  PROCALCITON <0.10 0.15  WBC 11.3* 10.0    Microbiology No results found for this or any previous visit (from the past 240 hour(s)).  Procedures and diagnostic studies:  No results found.  Medications:   . acidophilus  1 capsule Oral Daily  . Chlorhexidine Gluconate Cloth  6 each Topical Daily  . citalopram  10 mg Oral Daily  . collagenase   Topical Daily  . enoxaparin (LOVENOX) injection  40 mg Subcutaneous Q24H  . feeding supplement (PRO-STAT SUGAR FREE 64)  30 mL Per Tube Daily  . free water  50 mL Per Tube Q6H  . midodrine  10 mg Oral TID WC  . tamsulosin  0.4 mg Oral QPM   Continuous Infusions: . sodium chloride 20 mL/hr at 09/27/19 1216  .  ceFAZolin (ANCEF) IV    . feeding supplement (OSMOLITE 1.2 CAL) Stopped (09/26/19 1856)     LOS: 11 days   Sena Hoopingarner  [facility] Pager [phone].   *Please refer to [email or web address], password [email or web address] to get updated schedule on who will round on this patient, as hospitalists switch teams weekly. If 7PM-7AM, please contact night-coverage at [email or web address], password [email or web address] for any overnight needs.  09/27/2019, 3:17 PM

## 2019-09-27 NOTE — Interval H&P Note (Signed)
History and Physical Interval Note:  09/27/2019 5:09 PM  Joseph Flores  has presented today for surgery, with the diagnosis of OROPHARYNEAL DYSPHAGIA.  The various methods of treatment have been discussed with the patient and family. After consideration of risks, benefits and other options for treatment, the patient has consented to  Procedure(s): PERCUTANEOUS ENDOSCOPIC GASTROSTOMY (PEG) PLACEMENT (N/A) as a surgical intervention.  The patient's history has been reviewed, patient examined, no change in status, stable for surgery.  I have reviewed the patient's chart and labs.  Questions were answered to the patient's satisfaction.     [place], [place]

## 2019-09-27 NOTE — Transfer of Care (Signed)
Immediate Anesthesia Transfer of Care Note  Patient: Joseph Flores  Procedure(s) Performed: PERCUTANEOUS ENDOSCOPIC GASTROSTOMY (PEG) PLACEMENT (N/A )  Patient Location: PACU  Anesthesia Type:General  Level of Consciousness: sedated  Airway & Oxygen Therapy: Patient Spontanous Breathing and Patient connected to nasal cannula oxygen  Post-op Assessment: Report given to RN and Post -op Vital signs reviewed and stable  Post vital signs: Reviewed and stable  Last Vitals:  Vitals Value Taken Time  BP    Temp    Pulse    Resp    SpO2      Last Pain:  Vitals:   09/27/19 1600  TempSrc: Oral  PainSc:       Patients Stated Pain Goal: 0 (60/15/61 5379)  Complications: No apparent anesthesia complications

## 2019-09-27 NOTE — Progress Notes (Signed)
   09/27/19 1105  Vitals  Temp 98.9 F (37.2 C)  Temp Source Axillary  BP (!) 82/65  MAP (mmHg) 72  BP Location Left Arm  BP Method Automatic  Patient Position (if appropriate) Lying  Pulse Rate 82  Pulse Rate Source Monitor  MEWS Score  MEWS RR 0  MEWS Pulse 0  MEWS Systolic 1  MEWS LOC 0  MEWS Temp 0  MEWS Score 1  MEWS Score Color Green  Dr. Mal Misty aware of current soft pressures, no new orders.  ENDO also aware and wants midodrine given even though patient is NPO for peg placement this afternoon.  Clarise Cruz, BSN

## 2019-09-27 NOTE — Anesthesia Preprocedure Evaluation (Signed)
Anesthesia Evaluation  Patient identified by MRN, date of birth, ID band Patient awake and Patient confused    Reviewed: Allergy & Precautions, NPO status , Patient's Chart, lab work & pertinent test results  History of Anesthesia Complications Negative for: history of anesthetic complications  Airway Mallampati: II  TM Distance: >3 FB Neck ROM: Full    Dental no notable dental hx.    Pulmonary neg sleep apnea, pneumonia (aspiration PNA on admission, intubated for hypoxic respiratory failure, since extubated and on RA), neg COPD,    breath sounds clear to auscultation- rhonchi (-) wheezing      Cardiovascular (-) hypertension(-) CAD, (-) Past MI, (-) Cardiac Stents and (-) CABG  Rhythm:Regular Rate:Normal - Systolic murmurs and - Diastolic murmurs    Neuro/Psych neg Seizures negative neurological ROS     GI/Hepatic negative GI ROS, Neg liver ROS,   Endo/Other  negative endocrine ROSneg diabetes  Renal/GU Renal InsufficiencyRenal disease     Musculoskeletal  (+) Arthritis ,   Abdominal (+) - obese,   Peds  Hematology negative hematology ROS (+)   Anesthesia Other Findings Past Medical History: No date: Arthritis No date: Degeneration of intervertebral disc of lumbar region No date: Down's syndrome   Reproductive/Obstetrics                             Anesthesia Physical Anesthesia Plan  ASA: III  Anesthesia Plan: General   Post-op Pain Management:    Induction: Intravenous  PONV Risk Score and Plan: 1 and Propofol infusion  Airway Management Planned: Natural Airway  Additional Equipment:   Intra-op Plan:   Post-operative Plan:   Informed Consent: I have reviewed the patients History and Physical, chart, labs and discussed the procedure including the risks, benefits and alternatives for the proposed anesthesia with the patient or authorized representative who has indicated  his/her understanding and acceptance.   Patient has DNR.  Discussed DNR with power of attorney and Suspend DNR.   Dental advisory given and Consent reviewed with POA (phone consent from pt's sister Bethena Roys)  Plan Discussed with: CRNA and Anesthesiologist  Anesthesia Plan Comments:         Anesthesia Quick Evaluation

## 2019-09-27 NOTE — Anesthesia Post-op Follow-up Note (Signed)
Anesthesia QCDR form completed.        

## 2019-09-27 NOTE — Progress Notes (Addendum)
Pt's sister, Selinda Michaels (legal guardian) called to check on her brother.  "I'm worried about him."  I advised her that he is resting quietly, currently NPO for PEG tube placement later today.  She questioned what time the procedure was going to be because noone has called to advise her of this.  I explained that he is not on the schedule as of this moment.  A phone call will have to be made later this morning to find out what time the procedure would be scheduled for since Dr Alice Reichert was not on call this weekend.  I asked her if she wanted to do a telephone consent with myself and another RN for the PEG tube placement.  She told me she wanted to know what time the procedure was scheduled for first.  She wants to sign the consent in person.  I advised that someone would contact the procedure area later on this morning and when a time was obtained that she would receive a phone call.  She thanked me for the information.

## 2019-09-27 NOTE — Progress Notes (Addendum)
GI Inpatient Follow-up Note  Subjective:  Patient seen laying in bed and comfortable on room air. No acute events overnight. He has been NPO for PEG tube placement planned for this afternoon, but received his midodrine with applesauce. Patient has been wearing mitts and pulled out the Dobhoff tube yesterday. There appears to be no complaint of abdominal pain or discomfort.  Scheduled Inpatient Medications:  . acidophilus  1 capsule Oral Daily  . Chlorhexidine Gluconate Cloth  6 each Topical Daily  . citalopram  10 mg Oral Daily  . collagenase   Topical Daily  . enoxaparin (LOVENOX) injection  40 mg Subcutaneous Q24H  . feeding supplement (PRO-STAT SUGAR FREE 64)  30 mL Per Tube Daily  . free water  50 mL Per Tube Q6H  . midodrine  10 mg Oral TID WC  . tamsulosin  0.4 mg Oral QPM    Continuous Inpatient Infusions:   . sodium chloride 20 mL/hr at 09/27/19 1216  .  ceFAZolin (ANCEF) IV    . feeding supplement (OSMOLITE 1.2 CAL) Stopped (09/26/19 1856)    PRN Inpatient Medications:  acetaminophen, acetaminophen, ipratropium-albuterol, ondansetron (ZOFRAN) IV, oxyCODONE  Review of Systems:  Unable to tolerate due to patient's baseline status  Physical Examination: BP (!) 92/52 (BP Location: Right Arm)   Pulse 80   Temp 98.9 F (37.2 C) (Axillary)   Resp 15   Ht 5\' 4"  (1.626 m)   Wt 52 kg   SpO2 96%   BMI 19.68 kg/m  Gen: NAD, alert  HEENT: PEERLA, EOMI, Neck: supple, no JVD or thyromegaly Chest: CTA bilaterally, no wheezes, crackles, or other adventitious sounds CV: RRR, no m/g/c/r Abd: soft, NT, ND, +BS in all four quadrants; no HSM, guarding, ridigity, or rebound tenderness Ext: no edema, well perfused with 2+ pulses, Skin: no rash or lesions noted Lymph: no LAD  Data: Lab Results  Component Value Date   WBC 10.0 09/22/2019   HGB 8.5 (L) 09/22/2019   HCT 25.8 (L) 09/22/2019   MCV 88.7 09/22/2019   PLT 452 (H) 09/22/2019   Recent Labs  Lab 09/21/19 0406  09/22/19 0500  HGB 7.6* 8.5*   Lab Results  Component Value Date   NA 139 09/22/2019   K 4.2 09/24/2019   CL 103 09/22/2019   CO2 26 09/22/2019   BUN 11 09/22/2019   CREATININE 0.78 09/22/2019   Lab Results  Component Value Date   ALT 15 09/17/2019   AST 20 09/17/2019   ALKPHOS 47 09/17/2019   BILITOT 0.6 09/17/2019   No results for input(s): APTT, INR, PTT in the last 168 hours. Assessment/Plan:  Mr.Dickersonis a 58 y.o.malewith a PMH of Down's syndrome with declining cognitive status, DDD, arthritis admitted to the hospital for aspiration pneumonia  1. Recurrent aspiration pneumonia 2. Oropharyngeal dysphagia 3. Declining cognitive status 4. Failure to thrive  Recommendations:  1. PEG Tube procedure was thought to be canceled today, but anesthesia agreed to proceed at 5pm, later due to patient eating applesauce this AM. 2. Will leave DHT out. 3. Continuing to hold TF. Will plan to resume after PEG tube is placed.  4. Restraints needed in order to keep patient from pulling tube. Discussed with Dr. Mal Misty. I also discussed the need for patient to have restraints to patient's sister, Selinda Michaels, who is aware and agreeable. 5. PEG Tube Placement today with Dr. Alice Reichert (Patient needs PEG today per Dr. Mal Misty, will attempt this afternoon around 5 pm) - TKT. 6. He will  need to be strictly NPO now. Discussed case with attending MD DR. Mal Misty and RN M. Clarise Cruz.  Procedure again discussed with the patient's POA and closest relative, Selinda Michaels. Mrs. Lorel Monaco understands the nature of the planned procedure, indications, risks, alternatives and potential complications including but not limited to bleeding, infection, perforation, damage to internal organs and possible oversedation/side effects from anesthesia. The patient's sister, Selinda Michaels, agrees and gives consent to proceed.  Please refer to procedure notes for findings, recommendations and patient  disposition/instructions.   Please call with questions or concerns.    Octavia Bruckner, PA-C [facility] [phone] (Cell)

## 2019-09-27 NOTE — Progress Notes (Signed)
Patient has a covid test order from 09/26/2019 that has not been collected. Spoke with Dr. Mal Misty to see if it needs to be done for SNF placement.  Per Dr. Mal Misty, case management would like to hold off until closer to discharge.  Verbal order to discontinue cardiac monitoring.  Clarise Cruz, BSN

## 2019-09-27 NOTE — Care Management Important Message (Signed)
Important Message  Patient Details  Name: Joseph Flores MRN: [id number] Date of Birth: [birth year]   Medicare Important Message Given:  Yes     Dannette Barbara 09/27/2019, 10:46 AM

## 2019-09-27 NOTE — Op Note (Signed)
[facility] Gastroenterology Patient Name: Joseph Flores Procedure Date: 09/27/2019 5:48 PM MRN: [id number] Account #: [id number] Date of Birth: 03-Jul-1961 Admit Type: Inpatient Age: 58 Room: [facility] ENDO ROOM 4 Gender: Male Note Status: Finalized Procedure:             Upper GI endoscopy Indications:           Place PEG Providers:             Benay Pike. Alice Reichert MD, MD Referring MD:          No Local Md, MD (Referring MD) Medicines:             Propofol per Anesthesia Complications:         No immediate complications. Estimated blood loss:                         Minimal. Procedure:             Pre-Anesthesia Assessment:                        - The risks and benefits of the procedure and the                         sedation options and risks were discussed with the                         patient. All questions were answered and informed                         consent was obtained.                        - Patient identification and proposed procedure were                         verified prior to the procedure by the nurse. The                         procedure was verified in the procedure room.                        - ASA Grade Assessment: III - A patient with severe                         systemic disease.                        - After reviewing the risks and benefits, the patient                         was deemed in satisfactory condition to undergo the                         procedure.                        After obtaining informed consent, the endoscope was                         passed under direct vision. Throughout the procedure,  the patient's blood pressure, pulse, and oxygen                         saturations were monitored continuously. The Endoscope                         was introduced through the mouth, and advanced to the                         third part of duodenum. The upper GI endoscopy was          accomplished without difficulty. The patient tolerated                         the procedure well. Findings:      The esophagus was normal.      The entire examined stomach was normal.      The examined duodenum was normal.      The patient was placed in the supine position for PEG placement. The       stomach was insufflated to appose gastric and abdominal walls. A site       was located in the body of the stomach with excellent transillumination       for placement. The abdominal wall was marked and prepped in a sterile       manner. The area was anesthetized with 3 mL of 1% lidocaine. The trocar       needle was introduced through the abdominal wall and into the stomach       under direct endoscopic view. A snare was introduced through the       endoscope and opened in the gastric lumen. The guide wire was passed       through the trocar and into the open snare. The snare was closed around       the guide wire. The endoscope and snare were removed, pulling the wire       out through the mouth. A skin incision was made at the site of needle       insertion. The externally removable 20 Fr EndoVive Safety gastrostomy       tube was lubricated. The G-tube was tied to the guide wire and pulled       through the mouth and into the stomach. The trocar needle was removed,       and the gastrostomy tube was pulled out from the stomach through the       skin. The external bumper was attached to the gastrostomy tube, and the       tube was cut to remove the guide wire. The final position of the       gastrostomy tube was confirmed by relook endoscopy, and skin marking       noted to be 2.5 cm at the external bumper. The final tension and       compression of the abdominal wall by the PEG tube and external bumper       were checked and revealed that the bumper was loose and lightly touching       the skin and that the PEG balloon was loose and lightly touching the       stomach. The feeding  tube was capped, and the tube site cleaned and       dressed. Estimated blood loss was minimal.  Estimated blood loss was       minimal.      The exam was otherwise without abnormality. Impression:            - Normal esophagus.                        - Normal stomach.                        - Normal examined duodenum.                        - The examination was otherwise normal.                        - An externally removable PEG placement was                         successfully completed.                        - An externally removable PEG placement was                         successfully completed.                        - No specimens collected. Recommendation:        - Return patient to hospital ward for ongoing care.                        - Diet per speech therapy today.                        - Continue present medications.                        - The findings and recommendations were discussed with                         the patient's family. Procedure Code(s):     --- Professional ---                        [phone], Esophagogastroduodenoscopy, flexible,                         transoral; with directed placement of percutaneous                         gastrostomy tube Diagnosis Code(s):     --- Professional ---                        Z43.1, Encounter for attention to gastrostomy CPT copyright 2019 [employer]. All rights reserved. The codes documented in this report are preliminary and upon coder review may  be revised to meet current compliance requirements. Efrain Sella MD, MD 09/27/2019 6:22:28 PM This report has been signed electronically. Number of Addenda: 0 Note Initiated On: 09/27/2019 5:48 PM Estimated Blood Loss:  Estimated blood loss was minimal.      [facility]

## 2019-09-28 ENCOUNTER — Encounter: Payer: Self-pay | Admitting: Internal Medicine

## 2019-09-28 LAB — GLUCOSE, CAPILLARY
Glucose-Capillary: 75 mg/dL (ref 70–99)
Glucose-Capillary: 74 mg/dL (ref 70–99)
Glucose-Capillary: 116 mg/dL — ABNORMAL HIGH (ref 70–99)
Glucose-Capillary: 95 mg/dL (ref 70–99)
Glucose-Capillary: 122 mg/dL — ABNORMAL HIGH (ref 70–99)

## 2019-09-28 LAB — SARS CORONAVIRUS 2 (TAT 6-24 HRS): SARS Coronavirus 2: NEGATIVE

## 2019-09-28 MED ORDER — BACITRACIN-NEOMYCIN-POLYMYXIN 400-5-5000 EX OINT
1.0000 "application " | TOPICAL_OINTMENT | Freq: Every day | CUTANEOUS | Status: DC
  Administered 2019-09-28 – 2019-09-29 (×2): 1 via TOPICAL
  Filled 2019-09-28 (×2): qty 1

## 2019-09-28 MED ORDER — GUAIFENESIN 100 MG/5ML PO SOLN
5.0000 mL | ORAL | Status: DC | PRN
Start: 2019-09-28 — End: 2019-09-29
  Administered 2019-09-28 – 2019-09-29 (×2): 100 mg via ORAL
  Filled 2019-09-28 (×3): qty 5

## 2019-09-28 NOTE — Plan of Care (Signed)
Spoke to pt's sister and reassured her that pt is doing well.  He is resting comfortably and tolerating the tube feeds well.  Day nurse said that he was up most of the day and he hadn't gotten much sleep last night - so it was good he was resting now.

## 2019-09-28 NOTE — Progress Notes (Signed)
Nutrition Follow-up  DOCUMENTATION CODES:   Not applicable  INTERVENTION:  Continue Osmolite 1.2 Cal at goal rate of 50 mL/hr (1200 mL goal daily volume) + Pro-Stat 30 mL daily per tube. Provides 1540 kcal, 82 grams of protein, 984 mL H2O daily.  Goal TF regimen meets 100% RDIs for vitamins/minerals.  With free water flush of 50 mL Q4hrs patient is receiving a total of 1184 mL H2O daily including water in tube feeds. He is also receiving free water with Pro-Stat and medication administration.  Consider discontinuing order for CBG check Q4hrs as patient has no hx of DM and has not required any insulin on tube feeds. This order is part of order set when tube feeds are started.  NUTRITION DIAGNOSIS:   Inadequate oral intake related to dysphagia as evidenced by meal completion < 25%.  Ongoing - addressing with TF regimen.  GOAL:   Patient will meet greater than or equal to 90% of their needs  Met with TF regimen.  MONITOR:   TF tolerance, Labs, Weight trends, I & O's, Skin  REASON FOR ASSESSMENT:   Consult Enteral/tube feeding initiation and management  ASSESSMENT:   58 year old male presented from [facility] with progressively worsening SOB for the past 2 days and on off fever for the last 6 days. Past medical history significant of down syndrome, paraplegia, and dysphagia. CXR showed right lower lobe pneumonia and admitted for HCAP.  Patient pulled out his Dobbhoff tube over the weekend. He underwent placement of PEG tube yesterday. Patient seen lying in bed. Tube feeds infusing through new PEG tube already and patient seems to be tolerating. Patient's CBGs are within acceptable range and he is not requiring any insulin. Plan is for discharge to facility  Enteral Access: 20 Fr. Endovive Safety G-tube with C-clamp placed by GI on 11/16  Tube Feeds: Osmolite 1.2 Cal at 50 mL/hr + Pro-Stat 30 mL daily + free water flush 50 mL Q6hrs  Medications reviewed and include:  acidophilus 1 capsule daily, Flomax.  Labs reviewed: CBG 74-116.  Diet Order:   Diet Order            Diet NPO time specified  Diet effective now             EDUCATION NEEDS:   No education needs have been identified at this time  Skin:  Skin Assessment: Reviewed RN Assessment(unstageable;sacrum, stage I;healing;left;ankle)  Last BM:  09/27/2019 - medium type 7  Height:   Ht Readings from Last 1 Encounters:  09/16/19 _0  (1.626 m)   Weight:   Wt Readings from Last 1 Encounters:  09/27/19 52 kg   Ideal Body Weight:  56.1 kg(Adjusted IBW for paraplegia)  BMI:  Body mass index is 19.68 kg/m.  Estimated Nutritional Needs:   Kcal:  1450-1650  Protein:  73-83  Fluid:  > 1.4 L/day  Jacklynn Barnacle, MS, RD, LDN Office: [phone] Pager: [phone] After Hours/Weekend Pager: [phone]

## 2019-09-28 NOTE — TOC Progression Note (Signed)
Transition of Care [facility]) - Progression Note    Patient Details  Name: Joseph Flores MRN: [id number] Date of Birth: 02-07-1961  Transition of Care [facility]) CM/SW [place], RN Phone Number: 09/28/2019, 2:30 PM  Clinical Narrative:    Hilda Blades with [facility] called back and stated the patient will go to room 205 bed B when he discharges        Expected Discharge Plan and Services                                                 Social Determinants of Health (SDOH) Interventions    Readmission Risk Interventions No flowsheet data found.

## 2019-09-28 NOTE — Progress Notes (Signed)
Progress Note    Joseph Flores  CBJ:[id number] DOB: 07/31/1961  DOA: 09/16/2019 PCP: Dianne Dun, MD      Brief Narrative:    Medical records reviewed and are as summarized below:  Joseph Flores is an 58 y.o. male with medical history significant for Down syndrome, dysphagia, who presented to the hospital from the nursing home with shortness of breath and cough.  He was found to have acute hypoxemic respiratory failure and pneumonia, probable aspiration pneumonia.  He completed IV cefepime on 09/22/2019.  His hospitalization has been punctuated with intermittent episodes of hypotension requiring several boluses of IV fluids.  He was subsequently started on midodrine to help keep his blood pressure up.   Because of significant dysphagia and aspiration risk, gastroenterologist was consulted for evaluation for PEG tube placement per family's request.  PEG tube was placed on 09/27/2019 without any postoperative complications.      Assessment/Plan:   Principal Problem:   HCAP (healthcare-associated pneumonia) Active Problems:   Down's syndrome   Hypotension   Pressure injury of skin   Unstageable pressure ulcer of sacral region ([place])   Dysphagia   Hypoxia   Failure to thrive (0-17)   Body mass index is 19.68 kg/m.   Family Communication/Anticipated D/C date and plan/Code Status   DVT prophylaxis: Lovenox Code Status: DNR Family Communication: Plan of care was discussed with Bethena Roys, his sister. Disposition Plan: Possible discharge to SNF tomorrow  Sepsis secondary to pneumonia (probable aspiration pneumonia): Completed IV cefepime on 09/22/2019  Hypotension: Overall, BP has been stable.  Continue midodrine.   Dysphagia/failure to thrive: s/p PEG tube placement on 09/27/2019.  Continue enteral nutrition.  Chronic anemia: Hemoglobin is stable.  No evidence of active bleeding.  Hypokalemia: Improved  AKI and hypernatremia: Resolved  Unstageable sacral  decubitus ulcer: This was present on admission.  Continue local wound care.   Down's syndrome: Patient is bedridden and wheelchair-bound    Subjective:   No acute events overnight.  PEG tube was placed yesterday evening.  Objective:    Vitals:   09/27/19 2022 09/27/19 2200 09/28/19 0349 09/28/19 0817  BP: (!) 87/64 92/65 101/71 95/68  Pulse: 74 88 96 95  Resp: 18     Temp: (!) 97.5 F (36.4 C)  (!) 97.2 F (36.2 C) 97.6 F (36.4 C)  TempSrc: Oral  Axillary Axillary  SpO2: 96%  96% 94%  Weight:      Height:        Intake/Output Summary (Last 24 hours) at 09/28/2019 1313 Last data filed at 09/28/2019 0402 Gross per 24 hour  Intake 910.7 ml  Output 350 ml  Net 560.7 ml   Filed Weights   09/25/19 1133 09/26/19 0500 09/27/19 0422  Weight: 36 kg 47 kg 52 kg    Exam:   GEN: NAD SKIN: unstageable sacral decubitus ulcer EYES: EOMI ENT: MMM CV: RRR PULM: CTA B ABD: soft, ND, NT, +BS, +PEG tube covered with abdominal binder CNS: Alert but confused, non focal EXT: No edema or tenderness     Data Reviewed:   I have personally reviewed following labs and imaging studies:  Labs: Labs show the following:   Basic Metabolic Panel: Recent Labs  Lab 09/22/19 0500 09/24/19 0624  NA 139  --   K 3.2* 4.2  CL 103  --   CO2 26  --   GLUCOSE 93  --   BUN 11  --   CREATININE 0.78  --  CALCIUM 7.9*  --   MG 2.0  --    GFR Estimated Creatinine Clearance: 74.9 mL/min (by C-G formula based on SCr of 0.78 mg/dL). Liver Function Tests: No results for input(s): AST, ALT, ALKPHOS, BILITOT, PROT, ALBUMIN in the last 168 hours. No results for input(s): LIPASE, AMYLASE in the last 168 hours. No results for input(s): AMMONIA in the last 168 hours. Coagulation profile No results for input(s): INR, PROTIME in the last 168 hours.  CBC: Recent Labs  Lab 09/22/19 0500  WBC 10.0  NEUTROABS 6.1  HGB 8.5*  HCT 25.8*  MCV 88.7  PLT 452*   Cardiac Enzymes: No  results for input(s): CKTOTAL, CKMB, CKMBINDEX, TROPONINI in the last 168 hours. BNP (last 3 results) No results for input(s): PROBNP in the last 8760 hours. CBG: Recent Labs  Lab 09/27/19 2258 09/28/19 0553 09/28/19 0816 09/28/19 1025 09/28/19 1143  GLUCAP 80 75 74 116* 95   D-Dimer: No results for input(s): DDIMER in the last 72 hours. Hgb A1c: No results for input(s): HGBA1C in the last 72 hours. Lipid Profile: No results for input(s): CHOL, HDL, LDLCALC, TRIG, CHOLHDL, LDLDIRECT in the last 72 hours. Thyroid function studies: No results for input(s): TSH, T4TOTAL, T3FREE, THYROIDAB in the last 72 hours.  Invalid input(s): FREET3 Anemia work up: No results for input(s): VITAMINB12, FOLATE, FERRITIN, TIBC, IRON, RETICCTPCT in the last 72 hours. Sepsis Labs: Recent Labs  Lab 09/22/19 0500  PROCALCITON 0.15  WBC 10.0    Microbiology No results found for this or any previous visit (from the past 240 hour(s)).  Procedures and diagnostic studies:  No results found.  Medications:   . acidophilus  1 capsule Oral Daily  . Chlorhexidine Gluconate Cloth  6 each Topical Daily  . citalopram  10 mg Oral Daily  . collagenase   Topical Daily  . enoxaparin (LOVENOX) injection  40 mg Subcutaneous Q24H  . feeding supplement (PRO-STAT SUGAR FREE 64)  30 mL Per Tube Daily  . free water  50 mL Per Tube Q6H  . midodrine  10 mg Oral TID WC  . neomycin-bacitracin-polymyxin  1 application Topical Daily  . tamsulosin  0.4 mg Oral QPM   Continuous Infusions: . feeding supplement (OSMOLITE 1.2 CAL) 1,000 mL (09/28/19 0854)     LOS: 12 days   Elnoria Livingston  [facility] Pager [phone].   *Please refer to [email or web address], password [email or web address] to get updated schedule on who will round on this patient, as hospitalists switch teams weekly. If 7PM-7AM, please contact night-coverage at [email or web address], password [email or web address] for any overnight needs.  09/28/2019, 1:13 PM

## 2019-09-28 NOTE — TOC Progression Note (Signed)
Transition of Care [facility]) - Progression Note    Patient Details  Name: Joseph Flores MRN: [id number] Date of Birth: 07-May-1961  Transition of Care [facility]) CM/SW [place], RN Phone Number: 09/28/2019, 2:27 PM  Clinical Narrative:    Left a VM for Debra at [facility] letting her know the patient is anticipated for tomorrow and We will get a new covid test        Expected Discharge Plan and Services                                                 Social Determinants of Health (SDOH) Interventions    Readmission Risk Interventions No flowsheet data found.

## 2019-09-29 LAB — CBC WITH DIFFERENTIAL/PLATELET
Abs Immature Granulocytes: 0.04 10*3/uL (ref 0.00–0.07)
Basophils Absolute: 0 10*3/uL (ref 0.0–0.1)
Basophils Relative: 1 %
Eosinophils Absolute: 0 10*3/uL (ref 0.0–0.5)
Eosinophils Relative: 1 %
HCT: 26.4 % — ABNORMAL LOW (ref 39.0–52.0)
Hemoglobin: 8.5 g/dL — ABNORMAL LOW (ref 13.0–17.0)
Immature Granulocytes: 1 %
Lymphocytes Relative: 19 %
Lymphs Abs: 1.2 10*3/uL (ref 0.7–4.0)
MCH: 28.7 pg (ref 26.0–34.0)
MCHC: 32.2 g/dL (ref 30.0–36.0)
MCV: 89.2 fL (ref 80.0–100.0)
Monocytes Absolute: 0.8 10*3/uL (ref 0.1–1.0)
Monocytes Relative: 13 %
Neutro Abs: 4.1 10*3/uL (ref 1.7–7.7)
Neutrophils Relative %: 65 %
Platelets: 410 10*3/uL — ABNORMAL HIGH (ref 150–400)
RBC: 2.96 MIL/uL — ABNORMAL LOW (ref 4.22–5.81)
RDW: 16.7 % — ABNORMAL HIGH (ref 11.5–15.5)
WBC: 6.2 10*3/uL (ref 4.0–10.5)
nRBC: 0 % (ref 0.0–0.2)

## 2019-09-29 LAB — BASIC METABOLIC PANEL
Anion gap: 15 (ref 5–15)
BUN: 12 mg/dL (ref 6–20)
CO2: 30 mmol/L (ref 22–32)
Calcium: 7.7 mg/dL — ABNORMAL LOW (ref 8.9–10.3)
Chloride: 91 mmol/L — ABNORMAL LOW (ref 98–111)
Creatinine, Ser: 0.79 mg/dL (ref 0.61–1.24)
GFR calc Af Amer: >60 mL/min (ref >60)
GFR calc non Af Amer: >60 mL/min (ref >60)
Glucose, Bld: 106 mg/dL — ABNORMAL HIGH (ref 70–99)
Potassium: 3.4 mmol/L — ABNORMAL LOW (ref 3.5–5.1)
Sodium: 136 mmol/L (ref 135–145)

## 2019-09-29 MED ORDER — RISAQUAD PO CAPS: 1.0000 | ORAL_CAPSULE | Freq: Every day | ORAL | Status: AC

## 2019-09-29 MED ORDER — GUAIFENESIN ER 600 MG PO TB12: 600.0000 mg | ORAL_TABLET | Freq: Two times a day (BID) | ORAL | Status: AC | PRN

## 2019-09-29 MED ORDER — PRO-STAT SUGAR FREE PO LIQD: 30.0000 mL | Freq: Every day | ORAL | 0 refills | Status: AC

## 2019-09-29 MED ORDER — BISACODYL 5 MG PO TBEC: 5.0000 mg | DELAYED_RELEASE_TABLET | Freq: Every day | ORAL | 0 refills | Status: AC | PRN

## 2019-09-29 MED ORDER — FREE WATER: 50.0000 mL | Freq: Four times a day (QID) | Status: AC

## 2019-09-29 MED ORDER — CITALOPRAM HYDROBROMIDE 10 MG PO TABS: 10.0000 mg | ORAL_TABLET | Freq: Every day | ORAL | Status: AC

## 2019-09-29 MED ORDER — OSMOLITE 1.2 CAL PO LIQD: 1000.0000 mL | ORAL | 0 refills | Status: AC

## 2019-09-29 MED ORDER — ACETAMINOPHEN 325 MG PO TABS: 650.0000 mg | ORAL_TABLET | Freq: Three times a day (TID) | ORAL | Status: AC | PRN

## 2019-09-29 NOTE — Plan of Care (Signed)
Pt was picked up by EMS at approx 19:30.  Tube feeds were stopped. VS checked.  Pt has chronically low BP.  He's high risk for aspiration with tube feeds.  He is constantly sliding down in bed.  Sacral wound has not worsened and hip wound appears to be healing.  Pt is d/ced back to [facility].

## 2019-09-29 NOTE — TOC Progression Note (Signed)
Transition of Care [facility]) - Progression Note    Patient Details  Name: Joseph Flores MRN: [id number] Date of Birth: July 29, 1961  Transition of Care [facility]) CM/SW [place], RN Phone Number: 09/29/2019, 10:45 AM  Clinical Narrative:    Patient is to DC back to [facility] today to room 205 B The bedside nurse to call report to [phone] And to call EMS when ready to transport I spoke with his sister Bethena Roys and she is aware of the DC and the room       Expected Discharge Plan and Services           Expected Discharge Date: 09/29/19                                     Social Determinants of Health (SDOH) Interventions    Readmission Risk Interventions No flowsheet data found.

## 2019-09-29 NOTE — Discharge Summary (Addendum)
Physician Discharge Summary  Joseph Flores QJJ:[id number] DOB: [birth year] DOA: 09/16/2019  PCP: Joseph Dun, MD  Admit date: 09/16/2019 Discharge date: 09/29/2019  Admitted From: SNF  Disposition:  SNF   Recommendations for Outpatient Follow-up:  1. Follow up with GI Dr. Alice Flores by phone to see if follow up appointment for PEG tube placement is needed 2. Please repeat CBC in 2 weeks 3. Please refer to wound clinic if not already established 4. Please refer to Palliative Medicine to follow at Joseph Flores: N/A  Equipment/Devices: TBD at SNF  Discharge Condition: Fair  CODE STATUS: DO NOT RESUSCITATE Diet recommendation: Per tube  Brief/Interim Summary: [name] is a 58 y.o. M with Down's syndrome, dysphagia and hx DVT July 2019 with IVC filter still in place, and CKD III who presented with hypoxic respiratory failure.      PRINCIPAL HOSPITAL DIAGNOSIS: Sepsis    Discharge Diagnoses:   Sepsis secondary to pneumonia Acute hypoxic respiratory failure due to pneumonia The patient was admitted with shortness of breath, cough, found to have acute hypoxic respiratory failure due to pneumonia with tachycardia, tachpynea and respiratory failure.  This was suspected to be an aspiration pneumonia.  He was treated with IV cefepime completed 5 days.  Fever, heart rate, oxygenation, mentation, and respiratory rate all returned to normal.  Stable for discharge.   Failure to thrive due to dysphagia The patient was noted to have significant aspiration risk after speech therapy evaluation.  Gastroenterology were consulted and placed PEG tube on 11/16. Tube feeds should be Osmolite 1.2 at 50 cc/hr continuous, plus prostat and free water (as below).  Chronic hypotension Midodrine started in the hospital. Review of outside records shows that he has BP at baseline ~90/60 to 100/65 mmHg.  Midodrine stopped.  Down syndrome  Pressure injury stage I, left ankle,  POA Pressure injury sacrum, unstageable, POA  Hypokalemia Resolved  AKI due to dehydration Hypernatremia due to dehydration Resolved with fluids  BPH           Discharge Instructions  Discharge Instructions    Discharge instructions   Complete by: As directed    Diet instructions: Tube Feeds: Osmolite 1.2 Cal at 50 mL/hr + Pro-Stat 30 mL daily + free water flush 50 mL Q6hrs  Please ensure patient has follow up with GI, Dr. Alice Flores  Please ensure patient has follow up with wound care center for pressure ulcers   Increase activity slowly   Complete by: As directed      Allergies as of 09/29/2019   No Known Allergies     Medication List    STOP taking these medications   acetaminophen 650 MG suppository Commonly known as: TYLENOL Replaced by: acetaminophen 325 MG tablet   cefTRIAXone  IVPB Commonly known as: ROCEPHIN   clindamycin 300 MG/50ML IVPB Commonly known as: CLEOCIN   predniSONE 20 MG tablet Commonly known as: DELTASONE     TAKE these medications   acetaminophen 325 MG tablet Commonly known as: TYLENOL Place 2 tablets (650 mg total) into feeding tube every 8 (eight) hours as needed for mild pain, fever or headache. Replaces: acetaminophen 650 MG suppository   acidophilus Caps capsule Take 1 capsule by mouth daily.   bisacodyl 5 MG EC tablet Commonly known as: DULCOLAX Take 1 tablet (5 mg total) by mouth daily as needed for moderate constipation.   citalopram 10 MG tablet Commonly known as: CELEXA Place 1 tablet (10 mg total) into feeding tube  daily. What changed: how to take this   feeding supplement (OSMOLITE 1.2 CAL) Liqd Place 1,000 mLs into feeding tube continuous.   feeding supplement (PRO-STAT SUGAR FREE 64) Liqd Place 30 mLs into feeding tube daily. Start taking on: September 30, 2019   free water Soln Place 50 mLs into feeding tube every 6 (six) hours.   guaiFENesin 600 MG 12 hr tablet Commonly known as: MUCINEX Take 1  tablet (600 mg total) by mouth 2 (two) times daily as needed. What changed:   when to take this  reasons to take this   ipratropium-albuterol 0.5-2.5 (3) MG/3ML Soln Commonly known as: DUONEB Take 3 mLs by nebulization every 6 (six) hours as needed (cough).   tamsulosin 0.4 MG Caps capsule Commonly known as: FLOMAX Take 0.4 mg by mouth every evening.   Vitamin D (Ergocalciferol) 1.25 MG (50000 UT) Caps capsule Commonly known as: DRISDOL Take 1 capsule (50,000 Units total) by mouth once a week. For 12 weeks What changed:   when to take this  additional instructions       No Known Allergies  Consultations:  GI  Palliative Care   Procedures/Studies: Dg Chest Port 1 View  Result Date: 09/24/2019 CLINICAL DATA:  Hypotension EXAM: PORTABLE CHEST 1 VIEW COMPARISON:  09/16/2019 FINDINGS: Stable heart size and vascularity. Improving right lower lobe airspace disease/consolidation. Right hemidiaphragm is better visualized. Small right effusion suspected blunting the right costophrenic angle. Left lung remains clear. No pneumothorax. Trachea midline. No acute osseous finding. IMPRESSION: Improving right lower lobe airspace process/consolidation. Electronically Signed   By: Jerilynn Mages.  Shick M.D.   On: 09/24/2019 08:38   Dg Chest Portable 1 View  Result Date: 09/16/2019 CLINICAL DATA:  Intermittent fevers and shortness of breath. EXAM: PORTABLE CHEST 1 VIEW COMPARISON:  Chest x-ray dated August 21, 2019. FINDINGS: The heart size and mediastinal contours are within normal limits. Normal pulmonary vascularity. Right lower lobe consolidation. Probable small right pleural effusion. No pneumothorax. No acute osseous abnormality. IMPRESSION: Right lower lobe pneumonia. Electronically Signed   By: Titus Dubin M.D.   On: 09/16/2019 13:44   Dg Addison Bailey G Tube Plc W/fl W/rad  Result Date: 09/24/2019 CLINICAL DATA:  Feeding tube placement EXAM: NASO G TUBE PLACEMENT WITH FL AND WITH RAD CONTRAST:   5 mL Omnipaque 300 iodinated contrast FLUOROSCOPY TIME:  Fluoroscopy Time:  1:18 Number of Acquired Spot Images: 1 COMPARISON:  None. FINDINGS: Weighted enteric feeding tube was placed below the diaphragm via the left nostril under fluoroscopic guidance. Due to anatomy and limited patient tolerance for the procedure, the tube could not be advanced to a post pyloric position. Intragastric position was confirmed with injection of 5 mL iodinated contrast and the tube was secured at 55 cm depth. IMPRESSION: Weighted enteric feeding tube was placed within the gastric lumen under fluoroscopic guidance. Due to anatomy and limited patient tolerance for the procedure, the tube could not be advanced to a post pyloric position. Tube secured at 55 cm depth. Electronically Signed   By: Eddie Candle M.D.   On: 09/24/2019 15:16   11/16 EGD guided PEG placement Findings:      The esophagus was normal.      The entire examined stomach was normal.      The examined duodenum was normal.      The patient was placed in the supine position for PEG placement. The       stomach was insufflated to appose gastric and abdominal walls. A site  was located in the body of the stomach with excellent transillumination       for placement. The abdominal wall was marked and prepped in a sterile       manner. The area was anesthetized with 3 mL of 1% lidocaine. The trocar       needle was introduced through the abdominal wall and into the stomach       under direct endoscopic view. A snare was introduced through the       endoscope and opened in the gastric lumen. The guide wire was passed       through the trocar and into the open snare. The snare was closed around       the guide wire. The endoscope and snare were removed, pulling the wire       out through the mouth. A skin incision was made at the site of needle       insertion. The externally removable 20 Fr EndoVive Safety gastrostomy       tube was lubricated. The  G-tube was tied to the guide wire and pulled       through the mouth and into the stomach. The trocar needle was removed,       and the gastrostomy tube was pulled out from the stomach through the       skin. The external bumper was attached to the gastrostomy tube, and the       tube was cut to remove the guide wire. The final position of the       gastrostomy tube was confirmed by relook endoscopy, and skin marking       noted to be 2.5 cm at the external bumper. The final tension and       compression of the abdominal wall by the PEG tube and external bumper       were checked and revealed that the bumper was loose and lightly touching       the skin and that the PEG balloon was loose and lightly touching the       stomach. The feeding tube was capped, and the tube site cleaned and       dressed. Estimated blood loss was minimal. Estimated blood loss was       minimal.      The exam was otherwise without abnormality. Impression:            - Normal esophagus.                        - Normal stomach.                        - Normal examined duodenum.                        - The examination was otherwise normal.                        - An externally removable PEG placement was                         successfully completed.                        - An externally removable PEG placement was  successfully completed.                        - No specimens collected. Recommendation:        - Return patient to hospital ward for ongoing care.                        - Diet per speech therapy today.                        - Continue present medications.                        - The findings and recommendations were discussed with                         the patient's family.    Subjective: Patient nonverbal.  Nursing report no fever overnight, no new respiratory distress.  No vomiting, no agitation, no apparent pain.  Discharge Exam: Vitals:   09/28/19 1529  09/29/19 0519  BP: 100/67 95/68  Pulse: 97 75  Resp: 17   Temp: 98 F (36.7 C) 100.1 F (37.8 C)  SpO2: 95% 98%   Vitals:   09/28/19 0349 09/28/19 0817 09/28/19 1529 09/29/19 0519  BP: 101/71 95/68 100/67 95/68  Pulse: 96 95 97 75  Resp:   17   Temp: (!) 97.2 F (36.2 C) 97.6 F (36.4 C) 98 F (36.7 C) 100.1 F (37.8 C)  TempSrc: Axillary Axillary Oral Axillary  SpO2: 96% 94% 95% 98%  Weight:      Height:        General: Pt is  awake, looking around, not in acute distress, in bed, appears chronically ill, features of Downs Cardiovascular: RRR, nl S1-S2, no murmurs appreciated.   No LE edema.   Respiratory: Normal respiratory rate and rhythm.  CTAB without rales or wheezes. Abdominal: Abdomen soft and without gaurding or rebound.  There is tenderness when his new PEG is manipulated, and he states "ouch" but there is no discharge, redness or bleeding around it.  No distension or HSM.   Neuro/Psych: Strength symmetrically contractured in upper extremities, lower extremities.  Judgment and insight impaired      The results of significant diagnostics from this hospitalization (including imaging, microbiology, ancillary and laboratory) are listed below for reference.     Microbiology: Recent Results (from the past 240 hour(s))  SARS CORONAVIRUS 2 (TAT 6-24 HRS) Nasopharyngeal Nasopharyngeal Swab     Status: None   Collection Time: 09/26/19  4:35 AM   Specimen: Nasopharyngeal Swab  Result Value Ref Range Status   SARS Coronavirus 2 NEGATIVE NEGATIVE Final    Comment: (NOTE) SARS-CoV-2 target nucleic acids are NOT DETECTED. The SARS-CoV-2 RNA is generally detectable in upper and lower respiratory specimens during the acute phase of infection. Negative results do not preclude SARS-CoV-2 infection, do not rule out co-infections with other pathogens, and should not be used as the sole basis for treatment or other patient management decisions. Negative results must be combined  with clinical observations, patient history, and epidemiological information. The expected result is Negative. Fact Sheet for Patients: SugarRoll.be Fact Sheet for Healthcare Providers: https://www.woods-mathews.com/ This test is not yet approved or cleared by the [place] FDA and  has been authorized for detection and/or diagnosis of SARS-CoV-2 by FDA under an Emergency Use Authorization (EUA). This EUA  will remain  in effect (meaning this test can be used) for the duration of the COVID-19 declaration under Section 56 4(b)(1) of the Act, 21 U.S.C. section 360bbb-3(b)(1), unless the authorization is terminated or revoked sooner. Performed at [facility], [place] [address], [place], [place] [address]      Labs: BNP (last 3 results) No results for input(s): BNP in the last 8760 hours. Basic Metabolic Panel: Recent Labs  Lab 09/24/19 0624 09/29/19 0827  NA  --  136  K 4.2 3.4*  CL  --  91*  CO2  --  30  GLUCOSE  --  106*  BUN  --  12  CREATININE  --  0.79  CALCIUM  --  7.7*   Liver Function Tests: No results for input(s): AST, ALT, ALKPHOS, BILITOT, PROT, ALBUMIN in the last 168 hours. No results for input(s): LIPASE, AMYLASE in the last 168 hours. No results for input(s): AMMONIA in the last 168 hours. CBC: Recent Labs  Lab 09/29/19 0827  WBC 6.2  NEUTROABS 4.1  HGB 8.5*  HCT 26.4*  MCV 89.2  PLT 410*   Cardiac Enzymes: No results for input(s): CKTOTAL, CKMB, CKMBINDEX, TROPONINI in the last 168 hours. BNP: Invalid input(s): POCBNP CBG: Recent Labs  Lab 09/28/19 0553 09/28/19 0816 09/28/19 1025 09/28/19 1143 09/28/19 2210  GLUCAP 75 74 116* 95 122*   D-Dimer No results for input(s): DDIMER in the last 72 hours. Hgb A1c No results for input(s): HGBA1C in the last 72 hours. Lipid Profile No results for input(s): CHOL, HDL, LDLCALC, TRIG, CHOLHDL, LDLDIRECT in the last 72 hours. Thyroid function  studies No results for input(s): TSH, T4TOTAL, T3FREE, THYROIDAB in the last 72 hours.  Invalid input(s): FREET3 Anemia work up No results for input(s): VITAMINB12, FOLATE, FERRITIN, TIBC, IRON, RETICCTPCT in the last 72 hours. Urinalysis    Component Value Date/Time   COLORURINE YELLOW (A) 09/16/2019 1430   APPEARANCEUR HAZY (A) 09/16/2019 1430   APPEARANCEUR Clear 08/26/2014 2121   LABSPEC 1.028 09/16/2019 1430   LABSPEC 1.017 08/26/2014 2121   PHURINE 5.0 09/16/2019 1430   GLUCOSEU NEGATIVE 09/16/2019 1430   GLUCOSEU Negative 08/26/2014 2121   HGBUR NEGATIVE 09/16/2019 1430   BILIRUBINUR NEGATIVE 09/16/2019 1430   BILIRUBINUR Negative 08/26/2014 2121   KETONESUR NEGATIVE 09/16/2019 1430   PROTEINUR NEGATIVE 09/16/2019 1430   NITRITE NEGATIVE 09/16/2019 1430   LEUKOCYTESUR NEGATIVE 09/16/2019 1430   LEUKOCYTESUR Negative 08/26/2014 2121   Sepsis Labs Invalid input(s): PROCALCITONIN,  WBC,  LACTICIDVEN Microbiology Recent Results (from the past 240 hour(s))  SARS CORONAVIRUS 2 (TAT 6-24 HRS) Nasopharyngeal Nasopharyngeal Swab     Status: None   Collection Time: 09/26/19  4:35 AM   Specimen: Nasopharyngeal Swab  Result Value Ref Range Status   SARS Coronavirus 2 NEGATIVE NEGATIVE Final    Comment: (NOTE) SARS-CoV-2 target nucleic acids are NOT DETECTED. The SARS-CoV-2 RNA is generally detectable in upper and lower respiratory specimens during the acute phase of infection. Negative results do not preclude SARS-CoV-2 infection, do not rule out co-infections with other pathogens, and should not be used as the sole basis for treatment or other patient management decisions. Negative results must be combined with clinical observations, patient history, and epidemiological information. The expected result is Negative. Fact Sheet for Patients: SugarRoll.be Fact Sheet for Healthcare Providers: https://www.woods-mathews.com/ This test is  not yet approved or cleared by the [place] FDA and  has been authorized for detection and/or diagnosis of SARS-CoV-2 by  FDA under an Emergency Use Authorization (EUA). This EUA will remain  in effect (meaning this test can be used) for the duration of the COVID-19 declaration under Section 56 4(b)(1) of the Act, 21 U.S.C. section 360bbb-3(b)(1), unless the authorization is terminated or revoked sooner. Performed at [facility], [place] [address], [place], [place] [address]      Time coordinating discharge: 40 minutes      SIGNED:   Edwin Dada, MD  [facility] 09/29/2019, 12:19 PM

## 2019-10-01 NOTE — Anesthesia Postprocedure Evaluation (Signed)
Anesthesia Post Note  Patient: Joseph Flores  Procedure(s) Performed: PERCUTANEOUS ENDOSCOPIC GASTROSTOMY (PEG) PLACEMENT (N/A )  Patient location during evaluation: PACU Anesthesia Type: General Level of consciousness: awake and alert Pain management: pain level controlled Vital Signs Assessment: post-procedure vital signs reviewed and stable Respiratory status: spontaneous breathing, nonlabored ventilation, respiratory function stable and patient connected to nasal cannula oxygen Cardiovascular status: blood pressure returned to baseline and stable Postop Assessment: no apparent nausea or vomiting Anesthetic complications: no     Last Vitals:  Vitals:   09/29/19 0519 09/29/19 1932  BP: 95/68 101/76  Pulse: 75 95  Resp:  17  Temp: 37.8 C 36.8 C  SpO2: 98% 100%    Last Pain:  Vitals:   09/29/19 1131  TempSrc:   PainSc: [name]

## 2019-10-04 ENCOUNTER — Ambulatory Visit: Payer: Medicare Other

## 2019-10-07 ENCOUNTER — Emergency Department
Admission: EM | Admit: 2019-10-07 | Discharge: 2019-10-12 | Disposition: E | Payer: Medicare Other | Attending: Emergency Medicine | Admitting: Emergency Medicine

## 2019-10-07 ENCOUNTER — Emergency Department: Payer: Medicare Other

## 2019-10-07 ENCOUNTER — Encounter: Payer: Self-pay | Admitting: Emergency Medicine

## 2019-10-07 ENCOUNTER — Inpatient Hospital Stay (HOSPITAL_COMMUNITY)
Admission: AD | Admit: 2019-10-07 | Payer: Medicare Other | Source: Other Acute Inpatient Hospital | Admitting: Internal Medicine

## 2019-10-07 ENCOUNTER — Other Ambulatory Visit: Payer: Self-pay

## 2019-10-07 DIAGNOSIS — R0902 Hypoxemia: Secondary | ICD-10-CM | POA: Diagnosis not present

## 2019-10-07 DIAGNOSIS — R0603 Acute respiratory distress: Secondary | ICD-10-CM | POA: Diagnosis not present

## 2019-10-07 DIAGNOSIS — E860 Dehydration: Secondary | ICD-10-CM | POA: Diagnosis not present

## 2019-10-07 DIAGNOSIS — U071 COVID-19: Secondary | ICD-10-CM | POA: Insufficient documentation

## 2019-10-07 DIAGNOSIS — N170 Acute kidney failure with tubular necrosis: Secondary | ICD-10-CM

## 2019-10-07 DIAGNOSIS — R197 Diarrhea, unspecified: Secondary | ICD-10-CM | POA: Insufficient documentation

## 2019-10-07 DIAGNOSIS — J189 Pneumonia, unspecified organism: Secondary | ICD-10-CM | POA: Diagnosis not present

## 2019-10-07 DIAGNOSIS — R509 Fever, unspecified: Secondary | ICD-10-CM | POA: Diagnosis present

## 2019-10-07 DIAGNOSIS — A419 Sepsis, unspecified organism: Secondary | ICD-10-CM | POA: Diagnosis not present

## 2019-10-07 DIAGNOSIS — N179 Acute kidney failure, unspecified: Secondary | ICD-10-CM | POA: Insufficient documentation

## 2019-10-07 LAB — APTT: aPTT: 35 seconds (ref 24–36)

## 2019-10-07 LAB — CBC WITH DIFFERENTIAL/PLATELET
Abs Immature Granulocytes: 0.06 10*3/uL (ref 0.00–0.07)
Basophils Absolute: 0 10*3/uL (ref 0.0–0.1)
Basophils Relative: 0 %
Eosinophils Absolute: 0 10*3/uL (ref 0.0–0.5)
Eosinophils Relative: 0 %
HCT: 26.9 % — ABNORMAL LOW (ref 39.0–52.0)
Hemoglobin: 8.4 g/dL — ABNORMAL LOW (ref 13.0–17.0)
Immature Granulocytes: 1 %
Lymphocytes Relative: 8 %
Lymphs Abs: 0.8 10*3/uL (ref 0.7–4.0)
MCH: 28.7 pg (ref 26.0–34.0)
MCHC: 31.2 g/dL (ref 30.0–36.0)
MCV: 91.8 fL (ref 80.0–100.0)
Monocytes Absolute: 0.4 10*3/uL (ref 0.1–1.0)
Monocytes Relative: 4 %
Neutro Abs: 8 10*3/uL — ABNORMAL HIGH (ref 1.7–7.7)
Neutrophils Relative %: 87 %
Platelets: 453 10*3/uL — ABNORMAL HIGH (ref 150–400)
RBC: 2.93 MIL/uL — ABNORMAL LOW (ref 4.22–5.81)
RDW: 17.1 % — ABNORMAL HIGH (ref 11.5–15.5)
WBC: 9.2 10*3/uL (ref 4.0–10.5)
nRBC: 0 % (ref 0.0–0.2)

## 2019-10-07 LAB — GASTROINTESTINAL PANEL BY PCR, STOOL (REPLACES STOOL CULTURE)

## 2019-10-07 LAB — URINALYSIS, ROUTINE W REFLEX MICROSCOPIC
Bilirubin Urine: NEGATIVE
Glucose, UA: NEGATIVE mg/dL
Hgb urine dipstick: NEGATIVE
Ketones, ur: NEGATIVE mg/dL
Leukocytes,Ua: NEGATIVE
Nitrite: NEGATIVE
Protein, ur: NEGATIVE mg/dL
Specific Gravity, Urine: 1.016 (ref 1.005–1.030)
pH: 5 (ref 5.0–8.0)

## 2019-10-07 LAB — C DIFFICILE QUICK SCREEN W PCR REFLEX
C Diff antigen: NEGATIVE
C Diff interpretation: NOT DETECTED
C Diff toxin: NEGATIVE

## 2019-10-07 LAB — COMPREHENSIVE METABOLIC PANEL
ALT: 90 U/L — ABNORMAL HIGH (ref 0–44)
AST: 103 U/L — ABNORMAL HIGH (ref 15–41)
Albumin: 2.1 g/dL — ABNORMAL LOW (ref 3.5–5.0)
Alkaline Phosphatase: 48 U/L (ref 38–126)
Anion gap: 16 — ABNORMAL HIGH (ref 5–15)
BUN: 83 mg/dL — ABNORMAL HIGH (ref 6–20)
CO2: 24 mmol/L (ref 22–32)
Calcium: 8.1 mg/dL — ABNORMAL LOW (ref 8.9–10.3)
Chloride: 95 mmol/L — ABNORMAL LOW (ref 98–111)
Creatinine, Ser: 2.05 mg/dL — ABNORMAL HIGH (ref 0.61–1.24)
GFR calc Af Amer: 40 mL/min — ABNORMAL LOW (ref >60)
GFR calc non Af Amer: 35 mL/min — ABNORMAL LOW (ref >60)
Glucose, Bld: 155 mg/dL — ABNORMAL HIGH (ref 70–99)
Potassium: 4.4 mmol/L (ref 3.5–5.1)
Sodium: 135 mmol/L (ref 135–145)
Total Bilirubin: 0.3 mg/dL (ref 0.3–1.2)
Total Protein: 7.4 g/dL (ref 6.5–8.1)

## 2019-10-07 LAB — LACTIC ACID, PLASMA: Lactic Acid, Venous: 2.6 mmol/L (ref 0.5–1.9)

## 2019-10-07 LAB — PROTIME-INR
INR: 1.2 (ref 0.8–1.2)
Prothrombin Time: 15 seconds (ref 11.4–15.2)

## 2019-10-07 LAB — POC SARS CORONAVIRUS 2 AG: SARS Coronavirus 2 Ag: POSITIVE — AB

## 2019-10-07 LAB — INFLUENZA PANEL BY PCR (TYPE A & B)
Influenza A By PCR: NEGATIVE
Influenza B By PCR: NEGATIVE

## 2019-10-07 MED ORDER — NOREPINEPHRINE 4 MG/250ML-% IV SOLN
INTRAVENOUS | Status: AC
  Filled 2019-10-07: qty 250

## 2019-10-07 MED ORDER — SODIUM CHLORIDE 0.9 % IV BOLUS
1000.0000 mL | Freq: Once | INTRAVENOUS | Status: AC
  Administered 2019-10-07: 1000 mL via INTRAVENOUS

## 2019-10-07 MED ORDER — DEXMEDETOMIDINE BOLUS VIA INFUSION: 1.0000 ug/kg | Freq: Once | INTRAVENOUS | Status: DC

## 2019-10-07 MED ORDER — SODIUM CHLORIDE 0.9 % IV SOLN
1.0000 g | Freq: Once | INTRAVENOUS | Status: AC
  Administered 2019-10-07: 11:00:00 1 g via INTRAVENOUS
  Filled 2019-10-07: qty 1

## 2019-10-07 MED ORDER — MIDAZOLAM 50MG/50ML (1MG/ML) PREMIX INFUSION: 0.5000 mg/h | INTRAVENOUS | Status: DC

## 2019-10-07 MED ORDER — ATROPINE SULFATE 1 MG/ML IJ SOLN
INTRAMUSCULAR | Status: DC | PRN
  Administered 2019-10-07: 1 mg via INTRAVENOUS

## 2019-10-07 MED ORDER — ACETAMINOPHEN 650 MG RE SUPP
650.0000 mg | Freq: Once | RECTAL | Status: DC
  Filled 2019-10-07: qty 1

## 2019-10-07 MED ORDER — NOREPINEPHRINE 4 MG/250ML-% IV SOLN
0.0000 ug/min | INTRAVENOUS | Status: DC
  Administered 2019-10-07: 40 ug/min via INTRAVENOUS

## 2019-10-07 MED ORDER — SODIUM CHLORIDE 0.9 % IV SOLN
500.0000 mg | Freq: Once | INTRAVENOUS | Status: AC
  Administered 2019-10-07: 500 mg via INTRAVENOUS
  Filled 2019-10-07: qty 500

## 2019-10-07 MED ORDER — PROPOFOL 1000 MG/100ML IV EMUL
5.0000 ug/kg/min | INTRAVENOUS | Status: DC
  Administered 2019-10-07: 13:00:00 5 ug/kg/min via INTRAVENOUS
  Filled 2019-10-07: qty 100

## 2019-10-07 MED ORDER — DEXMEDETOMIDINE HCL IN NACL 400 MCG/100ML IV SOLN: 0.4000 ug/kg/h | INTRAVENOUS | Status: DC

## 2019-10-07 MED ORDER — METRONIDAZOLE IN NACL 5-0.79 MG/ML-% IV SOLN
500.0000 mg | Freq: Once | INTRAVENOUS | Status: DC
  Filled 2019-10-07: qty 100

## 2019-10-07 MED ORDER — SODIUM CHLORIDE 0.9 % IV BOLUS: 1000.0000 mL | Freq: Once | INTRAVENOUS | Status: DC

## 2019-10-07 MED ORDER — EPINEPHRINE 1 MG/10ML IJ SOSY
PREFILLED_SYRINGE | INTRAMUSCULAR | Status: DC | PRN
  Administered 2019-10-07 (×2): 1 via INTRAVENOUS

## 2019-10-07 MED ORDER — ACETAMINOPHEN 10 MG/ML IV SOLN
1000.0000 mg | Freq: Four times a day (QID) | INTRAVENOUS | Status: DC
  Filled 2019-10-07 (×2): qty 100

## 2019-10-08 ENCOUNTER — Non-Acute Institutional Stay: Payer: Self-pay | Admitting: Primary Care

## 2019-10-08 LAB — URINE CULTURE: Culture: NO GROWTH

## 2019-10-12 LAB — CULTURE, BLOOD (ROUTINE X 2)
Culture: NO GROWTH
Culture: NO GROWTH

## 2019-10-12 NOTE — ED Notes (Signed)
Code Charting for RSI to Follow:

## 2019-10-12 NOTE — ED Notes (Signed)
[facility]  CALLED  FOR  TRANSFER 

## 2019-10-12 NOTE — ED Notes (Signed)
55mg  of ketamine given by Gerald Leitz, RN

## 2019-10-12 NOTE — ED Notes (Signed)
This RN gave patient's sister an update.

## 2019-10-12 NOTE — Consult Note (Signed)
CODE SEPSIS - PHARMACY COMMUNICATION  **Broad Spectrum Antibiotics should be administered within 1 hour of Sepsis diagnosis**  Time Code Sepsis Called/Page Received: 9:41 AM  Antibiotics Ordered: Azithromycin, cefepime, metronidazole  Time of 1st antibiotic administration: 10:45  Additional action taken by pharmacy: Attempted to contact at 9:30, but unable to reach.  Spoke with charge nurse, who agreed to pull antibiotics as patient's nurse was in PPE.    If necessary, Name of Provider/Nurse Contacted: [name]    Joseph Flores ,PharmD 11-04-2019  9:58 AM

## 2019-10-12 NOTE — ED Triage Notes (Signed)
Pt arrives from [facility] via [facility]. Staff called reporting pt had severe diarrhea and a feverr of 104.6. pt received 1g of Rocephin im at facility prior to EMS. Pt has hx of down's syndrome.

## 2019-10-12 NOTE — ED Notes (Signed)
Patient's PCP called this RN and stated that patient had a positive guiac stool test recently and was recently changed from tube feedings only to nectar thick foods/liquids.  MD notified.

## 2019-10-12 NOTE — ED Notes (Signed)
Patient vomited in Bipap mask.  Bipap mask removed.  Patient placed on non-rebreather at 15L.  RT and MD notified.

## 2019-10-12 NOTE — ED Notes (Signed)
Vicente Males RN spoke with pt's legal guardian.
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
## 2019-10-12 NOTE — ED Notes (Signed)
Patient Given 1mg  of versed by Gerald Leitz, RN

## 2019-10-12 NOTE — ED Notes (Signed)
100mg  of succinylcholine given by Gerald Leitz, RN

## 2019-10-12 NOTE — ED Notes (Signed)
Dr. Cinda Quest called time of death.  Patient has no pulse.  Confirmed by Ultrasound.

## 2019-10-12 NOTE — ED Notes (Signed)
All paperwork given to transporter who will give to [facility]. Toe tag on pt and pt labels on outside of morgue bag. Pt transported to morgue.

## 2019-10-12 DEATH — deceased

## 2019-10-13 MED FILL — Medication: Qty: 1 | Status: AC

## 2019-10-30 IMAGING — DX DG ANKLE COMPLETE 3+V*L*
3 series · 3 of 3 positions shown · non-contrast
Comparison: None.

CLINICAL DATA: Oliguria.   Advanced dementia.  LEFT ankle pain.

EXAM:
LEFT ANKLE COMPLETE - 3+ VIEW

[ankle lat]
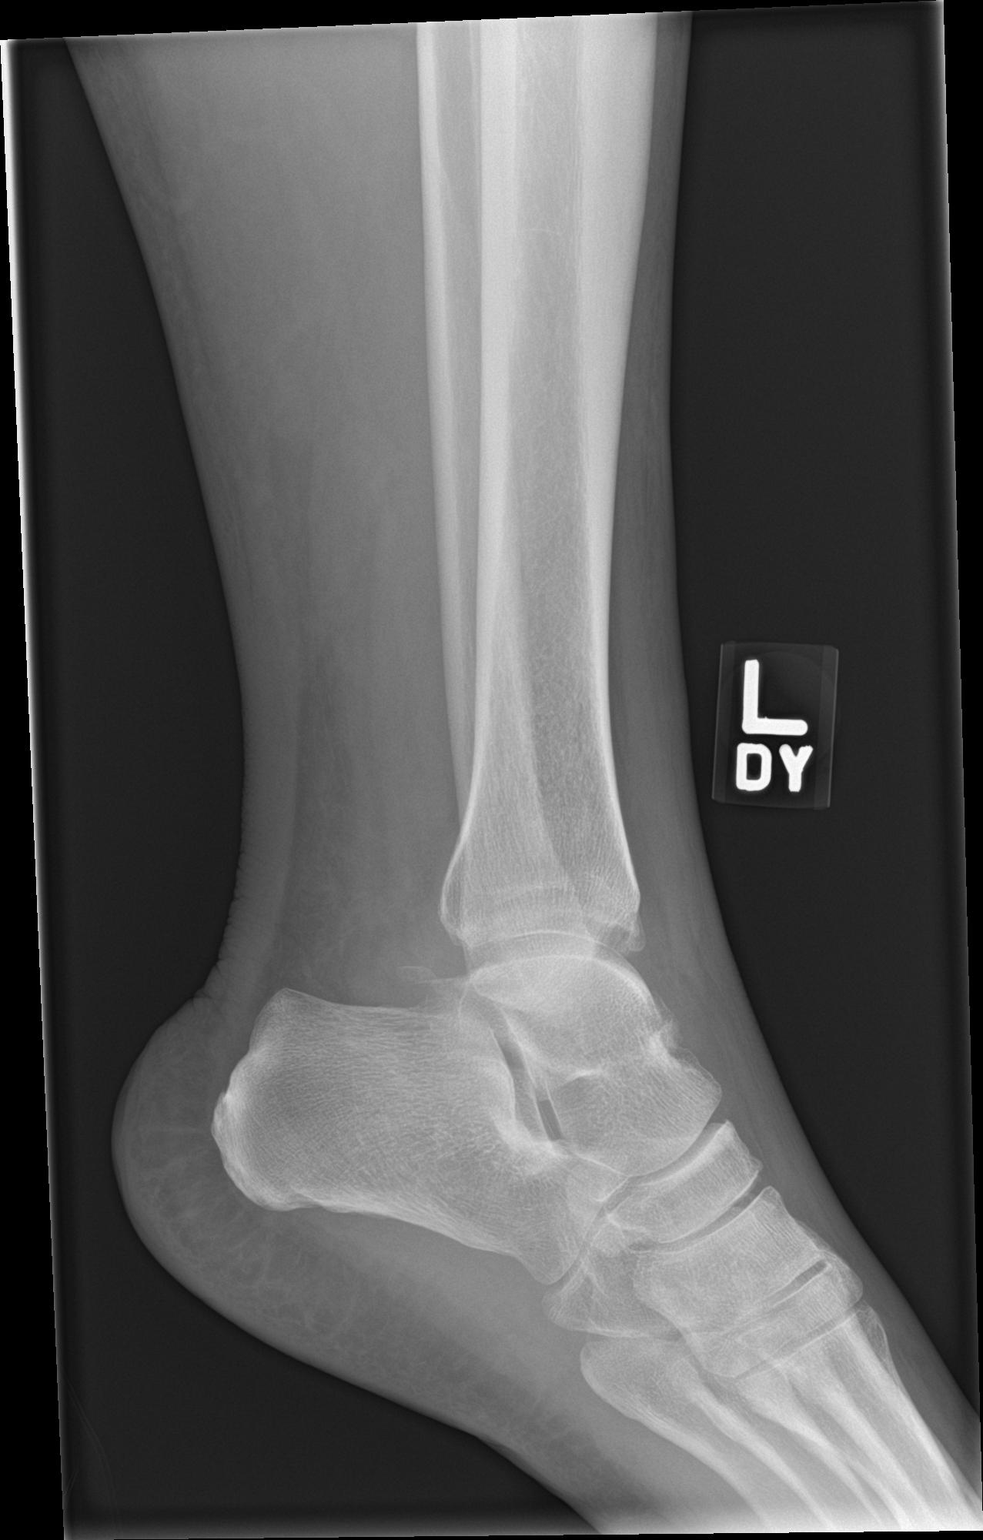

[ankle ap]
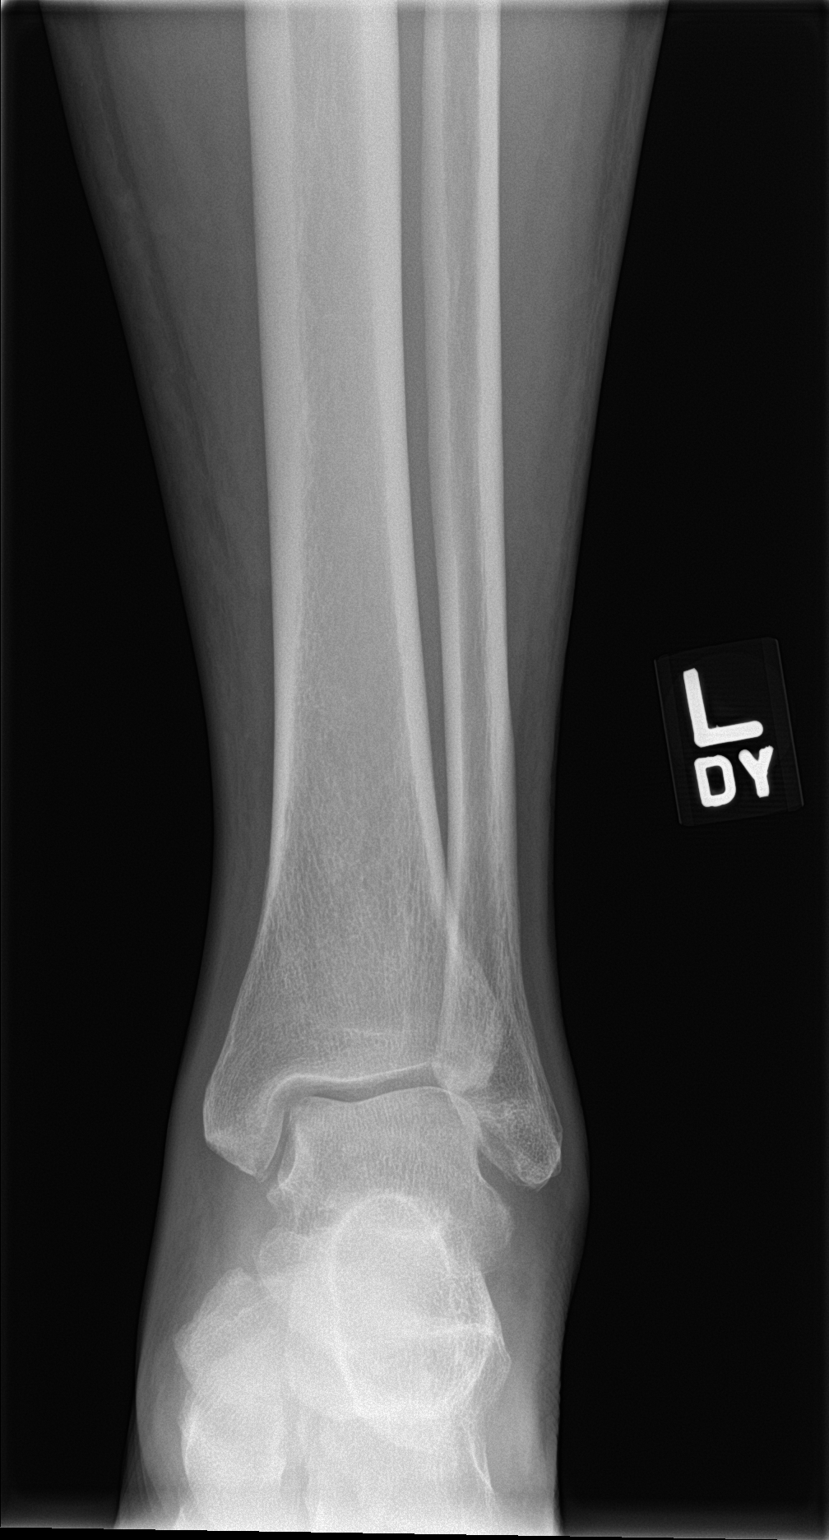

[ankle obl]
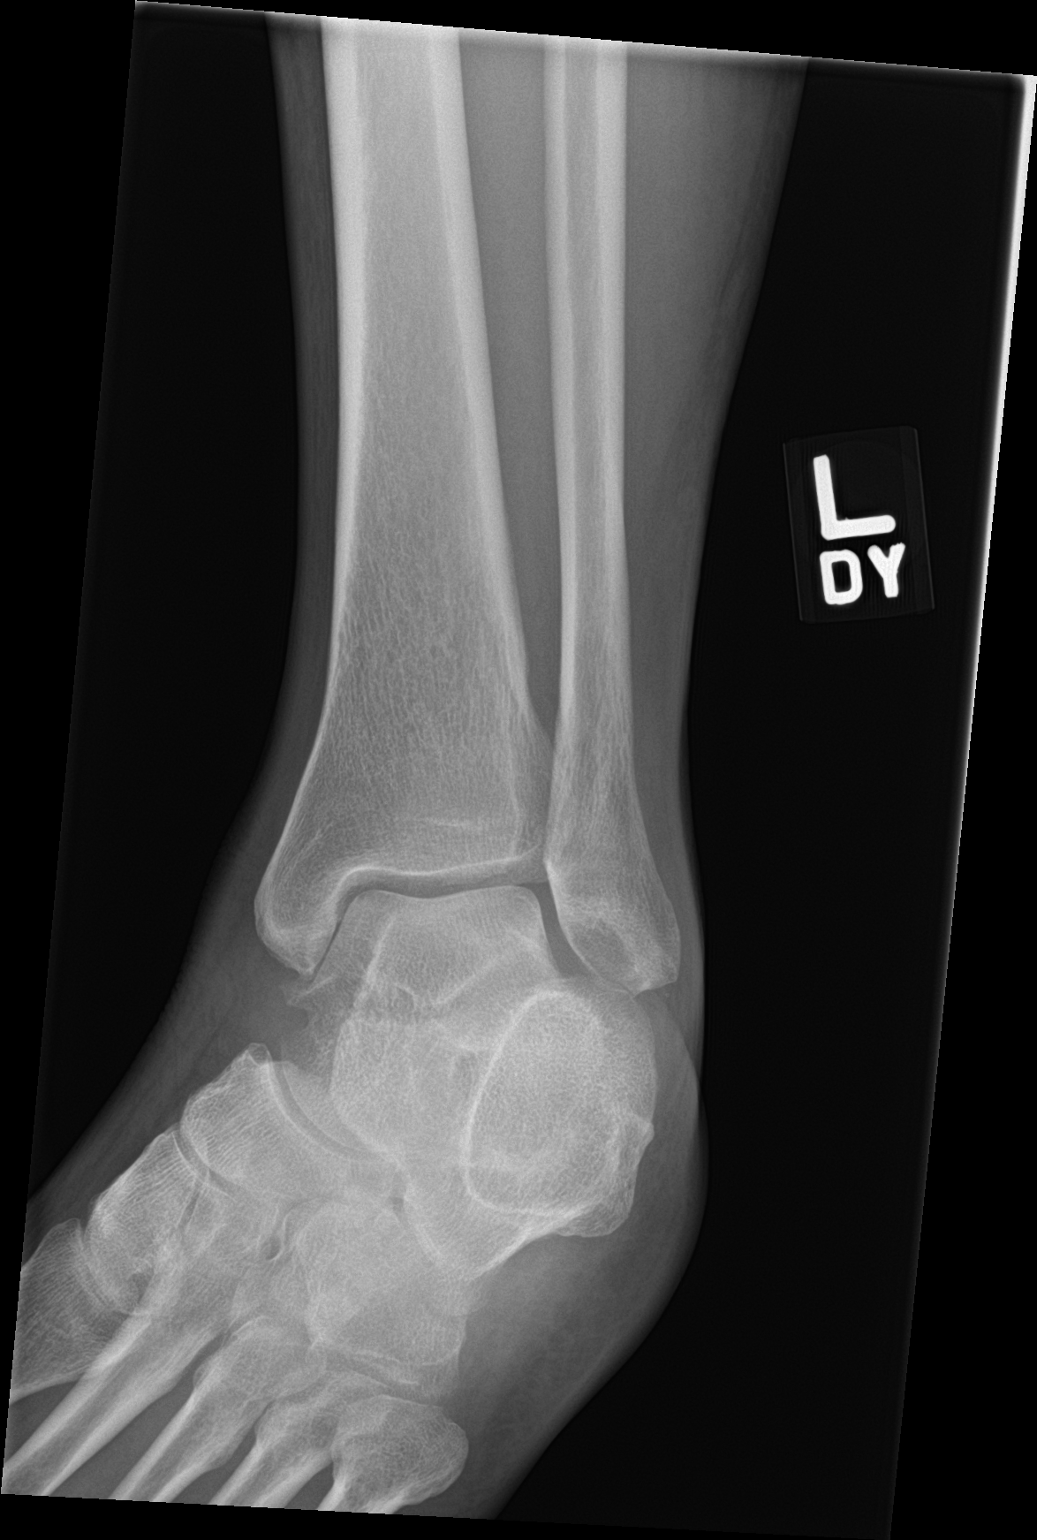

[3 of 3 positions shown; findings below may reference images not displayed]

FINDINGS: There is no evidence of fracture, dislocation, or joint effusion.
There is no evidence of arthropathy or other focal bone abnormality.
Soft tissues are unremarkable.
IMPRESSION: Negative.
